# Patient Record
Sex: Female | Born: 1955 | Race: White | Hispanic: No | Marital: Married | Smoking: Never smoker
Health system: Southern US, Community
[De-identification: ages and names within clinical notes are randomized; demographics above are authoritative.]

## PROBLEM LIST (undated history)

## (undated) DIAGNOSIS — B001 Herpesviral vesicular dermatitis: Secondary | ICD-10-CM

## (undated) DIAGNOSIS — F411 Generalized anxiety disorder: Secondary | ICD-10-CM

## (undated) DIAGNOSIS — F325 Major depressive disorder, single episode, in full remission: Secondary | ICD-10-CM

## (undated) DIAGNOSIS — Z803 Family history of malignant neoplasm of breast: Secondary | ICD-10-CM

## (undated) DIAGNOSIS — E042 Nontoxic multinodular goiter: Secondary | ICD-10-CM

## (undated) DIAGNOSIS — R7611 Nonspecific reaction to tuberculin skin test without active tuberculosis: Secondary | ICD-10-CM

## (undated) DIAGNOSIS — J302 Other seasonal allergic rhinitis: Secondary | ICD-10-CM

## (undated) DIAGNOSIS — Z808 Family history of malignant neoplasm of other organs or systems: Secondary | ICD-10-CM

## (undated) DIAGNOSIS — F419 Anxiety disorder, unspecified: Secondary | ICD-10-CM

## (undated) DIAGNOSIS — H40009 Preglaucoma, unspecified, unspecified eye: Secondary | ICD-10-CM

## (undated) DIAGNOSIS — E7800 Pure hypercholesterolemia, unspecified: Secondary | ICD-10-CM

## (undated) DIAGNOSIS — T7840XA Allergy, unspecified, initial encounter: Secondary | ICD-10-CM

## (undated) DIAGNOSIS — E039 Hypothyroidism, unspecified: Secondary | ICD-10-CM

## (undated) HISTORY — DX: Family history of malignant neoplasm of other organs or systems: Z80.8

## (undated) HISTORY — DX: Pure hypercholesterolemia, unspecified: E78.00

## (undated) HISTORY — DX: Hypothyroidism, unspecified: E03.9

## (undated) HISTORY — DX: Nonspecific reaction to tuberculin skin test without active tuberculosis: R76.11

## (undated) HISTORY — DX: Other seasonal allergic rhinitis: J30.2

## (undated) HISTORY — DX: Herpesviral vesicular dermatitis: B00.1

## (undated) HISTORY — DX: Family history of malignant neoplasm of breast: Z80.3

## (undated) HISTORY — PX: CATARACT EXTRACTION, BILATERAL: SHX1313

## (undated) HISTORY — DX: Nontoxic multinodular goiter: E04.2

## (undated) HISTORY — DX: Allergy, unspecified, initial encounter: T78.40XA

## (undated) HISTORY — DX: Preglaucoma, unspecified, unspecified eye: H40.009

## (undated) HISTORY — DX: Generalized anxiety disorder: F41.1

## (undated) HISTORY — DX: Anxiety disorder, unspecified: F41.9

## (undated) HISTORY — DX: Major depressive disorder, single episode, in full remission: F32.5

---
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
## 1986-11-25 DIAGNOSIS — R7611 Nonspecific reaction to tuberculin skin test without active tuberculosis: Secondary | ICD-10-CM

## 1986-11-25 HISTORY — DX: Nonspecific reaction to tuberculin skin test without active tuberculosis: R76.11

## 1998-08-17 ENCOUNTER — Ambulatory Visit (HOSPITAL_COMMUNITY): Admission: RE | Admit: 1998-08-17 | Discharge: 1998-08-17 | Payer: Self-pay | Admitting: *Deleted

## 1999-08-20 ENCOUNTER — Ambulatory Visit (HOSPITAL_COMMUNITY): Admission: RE | Admit: 1999-08-20 | Discharge: 1999-08-20 | Payer: Self-pay | Admitting: *Deleted

## 1999-08-20 ENCOUNTER — Encounter: Payer: Self-pay | Admitting: *Deleted

## 1999-08-23 ENCOUNTER — Encounter: Payer: Self-pay | Admitting: *Deleted

## 1999-08-23 ENCOUNTER — Ambulatory Visit (HOSPITAL_COMMUNITY): Admission: RE | Admit: 1999-08-23 | Discharge: 1999-08-23 | Payer: Self-pay | Admitting: *Deleted

## 1999-09-14 ENCOUNTER — Other Ambulatory Visit: Admission: RE | Admit: 1999-09-14 | Discharge: 1999-09-14 | Payer: Self-pay | Admitting: *Deleted

## 2000-08-14 ENCOUNTER — Encounter: Payer: Self-pay | Admitting: *Deleted

## 2000-08-14 ENCOUNTER — Encounter: Admission: RE | Admit: 2000-08-14 | Discharge: 2000-08-14 | Payer: Self-pay | Admitting: *Deleted

## 2000-09-16 ENCOUNTER — Other Ambulatory Visit: Admission: RE | Admit: 2000-09-16 | Discharge: 2000-09-16 | Payer: Self-pay | Admitting: *Deleted

## 2001-07-14 ENCOUNTER — Encounter: Payer: Self-pay | Admitting: *Deleted

## 2001-07-14 ENCOUNTER — Encounter: Admission: RE | Admit: 2001-07-14 | Discharge: 2001-07-14 | Payer: Self-pay | Admitting: *Deleted

## 2001-08-17 ENCOUNTER — Other Ambulatory Visit: Admission: RE | Admit: 2001-08-17 | Discharge: 2001-08-17 | Payer: Self-pay | Admitting: *Deleted

## 2002-07-12 ENCOUNTER — Encounter: Admission: RE | Admit: 2002-07-12 | Discharge: 2002-07-12 | Payer: Self-pay | Admitting: *Deleted

## 2002-07-12 ENCOUNTER — Encounter: Payer: Self-pay | Admitting: *Deleted

## 2002-08-23 ENCOUNTER — Other Ambulatory Visit: Admission: RE | Admit: 2002-08-23 | Discharge: 2002-08-23 | Payer: Self-pay | Admitting: *Deleted

## 2003-07-11 ENCOUNTER — Encounter: Admission: RE | Admit: 2003-07-11 | Discharge: 2003-07-11 | Payer: Self-pay

## 2003-08-26 ENCOUNTER — Other Ambulatory Visit: Admission: RE | Admit: 2003-08-26 | Discharge: 2003-08-26 | Payer: Self-pay

## 2004-07-16 ENCOUNTER — Encounter: Admission: RE | Admit: 2004-07-16 | Discharge: 2004-07-16 | Payer: Self-pay | Admitting: Family Medicine

## 2004-09-03 ENCOUNTER — Other Ambulatory Visit: Admission: RE | Admit: 2004-09-03 | Discharge: 2004-09-03 | Payer: Self-pay | Admitting: Family Medicine

## 2004-11-25 DIAGNOSIS — F325 Major depressive disorder, single episode, in full remission: Secondary | ICD-10-CM

## 2004-11-25 HISTORY — DX: Major depressive disorder, single episode, in full remission: F32.5

## 2005-07-16 ENCOUNTER — Encounter: Admission: RE | Admit: 2005-07-16 | Discharge: 2005-07-16 | Payer: Self-pay | Admitting: Family Medicine

## 2005-09-09 ENCOUNTER — Other Ambulatory Visit: Admission: RE | Admit: 2005-09-09 | Discharge: 2005-09-09 | Payer: Self-pay | Admitting: Family Medicine

## 2006-03-26 ENCOUNTER — Encounter: Payer: Self-pay | Admitting: *Deleted

## 2006-06-19 ENCOUNTER — Encounter: Admission: RE | Admit: 2006-06-19 | Discharge: 2006-06-19 | Payer: Self-pay | Admitting: Family Medicine

## 2006-09-05 ENCOUNTER — Ambulatory Visit: Payer: Self-pay | Admitting: Family Medicine

## 2006-09-09 ENCOUNTER — Ambulatory Visit: Payer: Self-pay | Admitting: Family Medicine

## 2007-03-12 ENCOUNTER — Ambulatory Visit: Payer: Self-pay | Admitting: Family Medicine

## 2007-04-02 ENCOUNTER — Ambulatory Visit: Payer: Self-pay | Admitting: Family Medicine

## 2007-07-07 ENCOUNTER — Encounter: Admission: RE | Admit: 2007-07-07 | Discharge: 2007-07-07 | Payer: Self-pay | Admitting: Family Medicine

## 2007-09-08 ENCOUNTER — Ambulatory Visit: Payer: Self-pay | Admitting: Family Medicine

## 2007-09-08 ENCOUNTER — Other Ambulatory Visit: Admission: RE | Admit: 2007-09-08 | Discharge: 2007-09-08 | Payer: Self-pay | Admitting: Family Medicine

## 2008-02-19 LAB — HM COLONOSCOPY

## 2008-03-18 ENCOUNTER — Ambulatory Visit: Payer: Self-pay | Admitting: Family Medicine

## 2008-05-25 ENCOUNTER — Encounter: Admission: RE | Admit: 2008-05-25 | Discharge: 2008-05-25 | Payer: Self-pay | Admitting: Family Medicine

## 2008-06-21 ENCOUNTER — Ambulatory Visit: Payer: Self-pay | Admitting: Family Medicine

## 2008-08-30 ENCOUNTER — Ambulatory Visit: Payer: Self-pay | Admitting: Family Medicine

## 2008-08-30 ENCOUNTER — Other Ambulatory Visit: Admission: RE | Admit: 2008-08-30 | Discharge: 2008-08-30 | Payer: Self-pay | Admitting: Family Medicine

## 2008-08-30 ENCOUNTER — Encounter: Payer: Self-pay | Admitting: Family Medicine

## 2009-02-13 ENCOUNTER — Ambulatory Visit: Payer: Self-pay | Admitting: Family Medicine

## 2009-04-26 ENCOUNTER — Encounter: Admission: RE | Admit: 2009-04-26 | Discharge: 2009-04-26 | Payer: Self-pay | Admitting: Family Medicine

## 2009-05-03 ENCOUNTER — Ambulatory Visit: Payer: Self-pay | Admitting: Family Medicine

## 2009-08-01 ENCOUNTER — Encounter: Admission: RE | Admit: 2009-08-01 | Discharge: 2009-08-01 | Payer: Self-pay | Admitting: Endocrinology

## 2009-08-20 ENCOUNTER — Emergency Department (HOSPITAL_COMMUNITY): Admission: EM | Admit: 2009-08-20 | Discharge: 2009-08-21 | Payer: Self-pay | Admitting: Emergency Medicine

## 2009-08-21 ENCOUNTER — Ambulatory Visit: Payer: Self-pay | Admitting: Family Medicine

## 2009-08-29 ENCOUNTER — Encounter (INDEPENDENT_AMBULATORY_CARE_PROVIDER_SITE_OTHER): Payer: Self-pay | Admitting: Diagnostic Radiology

## 2009-08-29 ENCOUNTER — Other Ambulatory Visit: Admission: RE | Admit: 2009-08-29 | Discharge: 2009-08-29 | Payer: Self-pay | Admitting: Diagnostic Radiology

## 2009-08-29 ENCOUNTER — Encounter: Admission: RE | Admit: 2009-08-29 | Discharge: 2009-08-29 | Payer: Self-pay | Admitting: Endocrinology

## 2009-09-22 ENCOUNTER — Ambulatory Visit: Payer: Self-pay | Admitting: Family Medicine

## 2010-01-04 ENCOUNTER — Other Ambulatory Visit: Admission: RE | Admit: 2010-01-04 | Discharge: 2010-01-04 | Payer: Self-pay | Admitting: Family Medicine

## 2010-04-18 ENCOUNTER — Encounter: Admission: RE | Admit: 2010-04-18 | Discharge: 2010-04-18 | Payer: Self-pay | Admitting: Family Medicine

## 2010-08-29 ENCOUNTER — Encounter: Admission: RE | Admit: 2010-08-29 | Discharge: 2010-08-29 | Payer: Self-pay | Admitting: Endocrinology

## 2010-12-16 ENCOUNTER — Encounter: Payer: Self-pay | Admitting: Family Medicine

## 2011-03-01 LAB — POCT I-STAT, CHEM 8
Calcium, Ion: 1.18 mmol/L (ref 1.12–1.32)
Hemoglobin: 15.6 g/dL — ABNORMAL HIGH (ref 12.0–15.0)

## 2012-10-01 ENCOUNTER — Other Ambulatory Visit: Payer: Self-pay | Admitting: Endocrinology

## 2012-10-01 DIAGNOSIS — E049 Nontoxic goiter, unspecified: Secondary | ICD-10-CM

## 2012-10-02 ENCOUNTER — Ambulatory Visit
Admission: RE | Admit: 2012-10-02 | Discharge: 2012-10-02 | Disposition: A | Discharge status: Home / Self Care | Payer: BC Managed Care – PPO | Source: Ambulatory Visit | Attending: Endocrinology | Admitting: Endocrinology

## 2012-10-02 DIAGNOSIS — E049 Nontoxic goiter, unspecified: Secondary | ICD-10-CM

## 2013-07-08 ENCOUNTER — Other Ambulatory Visit (HOSPITAL_COMMUNITY)
Admission: RE | Admit: 2013-07-08 | Discharge: 2013-07-08 | Disposition: A | Discharge status: Home / Self Care | Payer: BC Managed Care – PPO | Source: Ambulatory Visit | Attending: Family Medicine | Admitting: Family Medicine

## 2013-07-08 ENCOUNTER — Other Ambulatory Visit: Payer: Self-pay | Admitting: Family Medicine

## 2013-07-08 DIAGNOSIS — Z124 Encounter for screening for malignant neoplasm of cervix: Secondary | ICD-10-CM | POA: Insufficient documentation

## 2013-07-08 LAB — HM PAP SMEAR
HM PAP: NEGATIVE
HM PAP: NEGATIVE

## 2013-10-01 ENCOUNTER — Other Ambulatory Visit: Payer: Self-pay | Admitting: Endocrinology

## 2013-10-01 DIAGNOSIS — E049 Nontoxic goiter, unspecified: Secondary | ICD-10-CM

## 2014-10-03 ENCOUNTER — Ambulatory Visit
Admission: RE | Admit: 2014-10-03 | Discharge: 2014-10-03 | Disposition: A | Discharge status: Home / Self Care | Payer: No Typology Code available for payment source | Source: Ambulatory Visit | Attending: Endocrinology | Admitting: Endocrinology

## 2014-10-03 DIAGNOSIS — E049 Nontoxic goiter, unspecified: Secondary | ICD-10-CM

## 2015-03-22 LAB — HM MAMMOGRAPHY: HM Mammogram: NEGATIVE

## 2015-03-22 LAB — HM DEXA SCAN: HM Dexa Scan: NEGATIVE

## 2015-08-21 ENCOUNTER — Telehealth: Payer: Self-pay | Admitting: Family Medicine

## 2015-08-21 NOTE — Telephone Encounter (Signed)
Pt called, she was a pt here in 2010 and seen dr Redmond School and wants to become a new est patient here and was requesting to see you. I told her you wasn't accepting new pts but I would ask you first. Rachel Juarez she would just really like a woman, pt can be reached at [phone]

## 2015-08-21 NOTE — Telephone Encounter (Signed)
If asking for female provider, offer [facility], who is taking new patients

## 2015-08-22 NOTE — Telephone Encounter (Signed)
Called pt back two times to call me back

## 2015-08-25 ENCOUNTER — Ambulatory Visit: Payer: Self-pay | Admitting: Family Medicine

## 2015-08-30 ENCOUNTER — Ambulatory Visit (INDEPENDENT_AMBULATORY_CARE_PROVIDER_SITE_OTHER): Payer: BLUE CROSS/BLUE SHIELD | Admitting: Family Medicine

## 2015-08-30 ENCOUNTER — Encounter: Payer: Self-pay | Admitting: Family Medicine

## 2015-08-30 VITALS — BP 122/80 | HR 72 | Ht 66.25 in | Wt 167.8 lb

## 2015-08-30 DIAGNOSIS — F411 Generalized anxiety disorder: Secondary | ICD-10-CM

## 2015-08-30 DIAGNOSIS — J302 Other seasonal allergic rhinitis: Secondary | ICD-10-CM | POA: Diagnosis not present

## 2015-08-30 DIAGNOSIS — E042 Nontoxic multinodular goiter: Secondary | ICD-10-CM

## 2015-08-30 DIAGNOSIS — F325 Major depressive disorder, single episode, in full remission: Secondary | ICD-10-CM

## 2015-08-30 DIAGNOSIS — Z23 Encounter for immunization: Secondary | ICD-10-CM

## 2015-08-30 DIAGNOSIS — B001 Herpesviral vesicular dermatitis: Secondary | ICD-10-CM | POA: Diagnosis not present

## 2015-08-30 MED ORDER — FLUTICASONE PROPIONATE 50 MCG/ACT NA SUSP: 2.0000 | Freq: Every day | NASAL | Status: DC

## 2015-08-30 MED ORDER — MONTELUKAST SODIUM 10 MG PO TABS: 10.0000 mg | ORAL_TABLET | Freq: Every day | ORAL | Status: DC

## 2015-08-30 MED ORDER — VALACYCLOVIR HCL 1 G PO TABS: ORAL_TABLET | ORAL | Status: DC

## 2015-08-30 MED ORDER — CITALOPRAM HYDROBROMIDE 40 MG PO TABS: 40.0000 mg | ORAL_TABLET | Freq: Every day | ORAL | Status: DC

## 2015-08-30 NOTE — Progress Notes (Signed)
Chief Complaint  Patient presents with  . Establish Care    here to establish care with Dr.Aliany Fiorenza.    Patient presents to establish care.  She is under the care of Dr. Chalmers Cater for her hypothyroidism/multinodular goiter. She previously saw Dr. Cipriano Mile at [facility]. Records have not yet been requested.  She needs refills on her medications . She last saw Dr. Tamala Julian about a year ago for her physical.  Herpes labialis--uses valtrex 2000mg  qd x 2 days prn flare  Depression and anxiety--diagnosed when her daughter was diagnosed with auto-immune disorder 10 years ago.  She has been on the medication ever since.  Her daughter is doing well with treatment.  She denies any depression.  Has anxiety related to getting mammograms and flying, and uses xanax prn in those situations.  She has never tried a lower dose of coming off of the medication.  Allergies:  They are year-round but seasonal component, definitely worse in the fall.  They are overall well controlled with daily Allegra, Flonase and singulair.   She has had elevated cholesterol--reports that the total was high, the HDL was >100, but that the LDL was also a little high.  She tries to take plant sterols and fish oil, and follow a low cholesterol diet.  Immunizations: She believes her last tetanus was 2006 (before her daughter got sick)--doesn't believe she has gotten a TdaP.  Past Medical History  Diagnosis Date  . Hypothyroidism     treated by Dr. Chalmers Cater  . Seasonal allergies     takes singulair year-round; worse in the Fall  . 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 Generalized anxiety disorder     generalized, and related to mammograms and flying  . Herpes labialis   . Glaucoma suspect     sees Dr. Katy Fitch, has all been okay, sees him qoyr  . Hypercholesterolemia     high HDL, borderline LDL  . Multinodular goiter     followed by Dr. Chalmers Cater; u/s q2 yr  . 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 No pertinent past surgical history.  Social History   Social History  . Marital Status: Married    Spouse Name: N/A  . Number of Children: N/A  . Years of Education: N/A   Occupational History  . yoga teacher    Social History Main Topics  . Smoking status: Never Smoker   . Smokeless tobacco: Never Used  . Alcohol Use: 0.0 oz/week    0 Standard drinks or equivalent per week     Comment: 2 glasses of wine per day with dinner  . Drug Use: No  . Sexual Activity: Not on file   Other Topics Concern  . Not on file   Social History Narrative   Teaches yoga at churches, [employer], and 2 yoga studios.   Lives with her husband, and she has a college-age daughter ([place]).   1 dog ([place]poo), 1 cat    Family History  Problem Relation Age of Onset  . Lupus Mother   . Cancer Father 64    thymic cancer  . COPD Father   . Heart disease Father     CHF  . Asthma Father   . Dermatomyositis Daughter 73    juvenile form  . Irritable bowel syndrome Daughter   . Cancer Paternal Uncle     started on  tonsils (smoker)  . Colon cancer Maternal Grandmother     80's  . Cancer Maternal Grandmother   . Hypertension Paternal Grandmother   . Hyperlipidemia Paternal Grandmother   . Thyroid disease Paternal Grandmother     had goiter removed  . Stroke Paternal Grandmother 81  . Hypertension Paternal Grandfather   . Hyperlipidemia Paternal Grandfather   . Heart disease Paternal Grandfather     MI first in his 84's  . Stroke Paternal Grandfather   . Diabetes Neg Hx   . Breast cancer Neg Hx   . Ovarian cancer Neg Hx     Outpatient Encounter Prescriptions as of 08/30/2015  Medication Sig Note  . aspirin 81 MG tablet Take 81 mg by mouth daily.   . citalopram (CELEXA) 40 MG tablet Take 40 mg by mouth daily.   . fexofenadine (ALLEGRA) 180 MG tablet Take 180 mg by mouth daily.   . fluticasone (FLONASE) 50 MCG/ACT nasal spray Place 2 sprays into both  nostrils daily.   Marland Kitchen levothyroxine (SYNTHROID, LEVOTHROID) 75 MCG tablet Take 75 mcg by mouth daily before breakfast.   . montelukast (SINGULAIR) 10 MG tablet Take 10 mg by mouth at bedtime.   . Multiple Vitamins-Minerals (MULTIVITAMIN WITH MINERALS) tablet Take 1 tablet by mouth daily.   . Omega-3 Fatty Acids (FISH OIL) 1000 MG CPDR Take 2,000 mg by mouth daily.   Marland Kitchen PHYTOSTEROL ESTERS PO Take 2 tablets by mouth daily. 08/30/2015: Plant sterol for cholesterol.  Takes 2/day (supposed to take 4)  . ALPRAZolam (XANAX) 0.5 MG tablet Take 0.5 mg by mouth at bedtime as needed for anxiety. 08/30/2015: For flying and mammo day  . valACYclovir (VALTREX) 1000 MG tablet Take 1,000 mg by mouth 2 (two) times daily. 08/30/2015: Takes 2000 qd x 2 days prn flares of cold sores   No facility-administered encounter medications on file as of 08/30/2015.    Allergies  Allergen Reactions  . Demerol [Meperidine] Other (See Comments)    Low bp, passes out   ROS: no headaches, dizziness, chest pain, shortness of breath GI or GU complaints.  +allergies--overall controlled, occasional some mild eye symptoms.  Denies cough. Denies bleeding, bruising, rash, joint pains, depression, anxiety. No hair/skin/bowel/mood/energy/weight changes.  PHYSICAL EXAM: BP 122/80 mmHg  Pulse 72  Ht 5' 6.25" (1.683 m)  Wt 167 lb 12.8 oz (76.114 kg)  BMI 26.87 kg/m2  LMP 11/18/2008  Well developed, pleasant female in no distress HEENT: PERRL EOMI, conjunctiva clear. Nasal mucosa is moderately edematous, without erythema or purulence. OP is clear. Sinuses nontender Neck: no lymphadenopathy or carotid bruit. Mildly diffusely enlarged thyroid, without any dominant nodule Heart: regular rate and rhythm without murmur Lungs: clear bilaterally Back: no spinal or CVA tenderness Abdomen: soft, nontender, no organomegaly or mass Extremities: no edema, 2+ pulses Neuro: alert and oriented. Cranial nerves intact. Normal strength, gait Psych:  normal mood, affect, hygiene and grooming  ASSESSMENT/PLAN  Seasonal allergies - controlled with current regimen - Plan: fluticasone (FLONASE) 50 MCG/ACT nasal spray, montelukast (SINGULAIR) 10 MG tablet  Need for prophylactic vaccination and inoculation against influenza - Plan: Flu Vaccine QUAD 36+ mos PF IM (Fluarix & Fluzone Quad PF)  Herpes labialis - discussed changing to 2 doses 12 hrs apart prn flare, rather than 24hrs - Plan: valACYclovir (VALTREX) 1000 MG tablet  Depression, major, in remission ([place]) - Doing well; encouraged her to consider trial of 1/2 tab (20mg ) for a couple of months (increase back to 40mg  if recurrent sx) -  Plan: citalopram (CELEXA) 40 MG tablet  Generalized anxiety disorder - controlled with citalopram and prn xanax with flying and mammograms. consider trial of 20mg  - Plan: citalopram (CELEXA) 40 MG tablet  Multinodular goiter - as per Dr. Chalmers Cater.  UTD on TSH and ultrasound   Sign release of records to get records from [place]. Last CPE was about a year ago Check [facility] records for last tetanus.  May need Tdap

## 2015-08-30 NOTE — Patient Instructions (Signed)
Sign release forms for records from [place]. If your last tetanus was 10 years ago or you never had Tdap, we will give you one at your physical. Continue your current medications. Consider decreasing the citalopram dose to 20mg  (1/2 tablet) to see if any symptoms recur at the lower dose.  If you take that for a few months, and have no recurrent symptoms, you can call to change the prescription to 20mg  (so you don't have to cut the pills), or you can consider cutting back on the dose further.

## 2015-09-11 ENCOUNTER — Telehealth: Payer: Self-pay | Admitting: Family Medicine

## 2015-09-11 NOTE — Telephone Encounter (Signed)
Records recv'd from Dr. Carol Ada

## 2015-09-21 ENCOUNTER — Encounter: Payer: Self-pay | Admitting: *Deleted

## 2015-09-26 ENCOUNTER — Encounter: Payer: Self-pay | Admitting: Family Medicine

## 2015-09-28 ENCOUNTER — Encounter: Payer: Self-pay | Admitting: Family Medicine

## 2015-10-04 ENCOUNTER — Encounter: Payer: Self-pay | Admitting: Family Medicine

## 2015-10-04 ENCOUNTER — Ambulatory Visit (INDEPENDENT_AMBULATORY_CARE_PROVIDER_SITE_OTHER): Payer: BLUE CROSS/BLUE SHIELD | Admitting: Family Medicine

## 2015-10-04 VITALS — BP 128/68 | HR 72 | Temp 98.0°F | Ht 66.25 in | Wt 170.8 lb

## 2015-10-04 DIAGNOSIS — J309 Allergic rhinitis, unspecified: Secondary | ICD-10-CM

## 2015-10-04 DIAGNOSIS — R5383 Other fatigue: Secondary | ICD-10-CM | POA: Diagnosis not present

## 2015-10-04 NOTE — Patient Instructions (Signed)
  Drink plenty of fluids. Try taking Mucinex (plain or extra strength, not the D or DM)--this is an expectorant to loosen the phlegm and hopefully help the sinuses drain. Try using Sudafed as needed for sinus headaches.  If you don't have insomnia from this, you can consider getting a combination pill such as Claritin D.  Try switching to a different antisthistamine for a few days, and if that doesn't help, we can try switching to a different nasal steroid spray.  Claritin and zyrtec are the pill options.  Nasacort is an OTC steroid spray option, but there are many other prescription ones also available. Let us know if you develop fevers, or thick discolored nasal drainage (or phlegm)--this can be a sign of an infection that needs antibiotics.

## 2015-10-04 NOTE — Progress Notes (Signed)
Chief Complaint  Patient presents with  . Headache    has been waking up with "low grade headache," over the last 2 weeks. Also having a lot of fatigue. Lots of bloody mucus from her nose.    Complaining of low grade frontal headaches in the mornings.  These are relieved by Advil.  At first she would have the headache come back in the evening, but not recently.  She is also complaining of "brain fog", and lack of energy.  She has runny nose, sniffling, sneezing, itchy nose.  Nose is also sometimes dry, sometimes sees bloody drainage.  Feels heavy behind her eyes. No pain in her cheeks or teeth, no ear pain or sore throat.  She does have some postnasal drainage.  She has chronic allergies, and has been compliant with her flonase, allegra and singulair daily. Switched from zyrtec to [place] 3 years ago.  PMH, PSH, SH reviewed.  Outpatient Encounter Prescriptions as of 10/04/2015  Medication Sig Note  . aspirin 81 MG tablet Take 81 mg by mouth daily.   . citalopram (CELEXA) 40 MG tablet Take 1 tablet (40 mg total) by mouth daily.   . fexofenadine (ALLEGRA) 180 MG tablet Take 180 mg by mouth daily.   . fluticasone (FLONASE) 50 MCG/ACT nasal spray Place 2 sprays into both nostrils daily.   Marland Kitchen levothyroxine (SYNTHROID, LEVOTHROID) 75 MCG tablet Take 75 mcg by mouth daily before breakfast.   . montelukast (SINGULAIR) 10 MG tablet Take 1 tablet (10 mg total) by mouth at bedtime.   . Multiple Vitamins-Minerals (MULTIVITAMIN WITH MINERALS) tablet Take 1 tablet by mouth daily.   . Omega-3 Fatty Acids (FISH OIL) 1000 MG CPDR Take 2,000 mg by mouth daily.   Marland Kitchen PHYTOSTEROL ESTERS PO Take 2 tablets by mouth daily. 08/30/2015: Plant sterol for cholesterol.  Takes 2/day (supposed to take 4)  . ALPRAZolam (XANAX) 0.5 MG tablet Take 0.5 mg by mouth at bedtime as needed for anxiety. 08/30/2015: For flying and mammo day  . valACYclovir (VALTREX) 1000 MG tablet Take 2 tablets at onset of cold sore and repeat dose in  12 hours (4 tabs/course) (Patient not taking: Reported on 10/04/2015)    No facility-administered encounter medications on file as of 10/04/2015.   Allergies  Allergen Reactions  . Demerol [Meperidine] Other (See Comments)    Low bp, passes out   ROS: no fever, chills, purulent drainage, cough, shortness of breath, chest pain, palpitations, nausea, vomiting, diarrhea, bleeding, bruising, rash.  +fatigue and "brain fog" as per HPI.  PHYSICAL EXAM:  BP 128/68 mmHg  Pulse 72  Temp(Src) 98 F (36.7 C) (Tympanic)  Ht 5' 6.25" (1.683 m)  Wt 170 lb 12.8 oz (77.474 kg)  BMI 27.35 kg/m2  LMP 11/18/2008 Slightly tired appearing female, with frequent throat-clearing during visit, in no distress HEENT: PERRL, EOMI, conjunctiva clear.  TM's and EAC's normal. Nasal mucosa is moderately edematous with some clear mucus noted.  Mucosa is not particularly pale/boggy, but no erythema or purulence. Sinuses are nontender. OP is clear Neck: no lymphadenopathy, thyromegaly or mass Heart: regular rate and rhythm without murmur Lungs: clear bilaterally Skin: normal turgor, no rash Psych: normal mood, affect, hygiene and grooming  ASSESSMENT/PLAN:  Allergic rhinitis, unspecified allergic rhinitis type - add decongestant. Try switching to different antihistamine and/or nasal steroid. reviewed s/sx of infection  Other fatigue - and "brain fog"--suspect related to suboptimally controlled allergies and head congestion. Consider labs if sx persist when allergies better    Drink  plenty of fluids. Try taking Mucinex (plain or extra strength, not the D or DM)--this is an expectorant to loosen the phlegm and hopefully help the sinuses drain. Try using Sudafed as needed for sinus headaches.  If you don't have insomnia from this, you can consider getting a combination pill such as Claritin D.  Try switching to a different antisthistamine for a few days, and if that doesn't help, we can try switching to a different  nasal steroid spray.  Claritin and zyrtec are the pill options.  Nasacort is an OTC steroid spray option, but there are many other prescription ones also available. Let us know if you develop fevers, or thick discolored nasal drainage (or phlegm)--this can be a sign of an infection that needs antibiotics.

## 2015-10-09 ENCOUNTER — Telehealth: Payer: Self-pay | Admitting: Family Medicine

## 2015-10-09 ENCOUNTER — Encounter: Payer: Self-pay | Admitting: Family Medicine

## 2015-10-09 MED ORDER — AMOXICILLIN-POT CLAVULANATE 875-125 MG PO TABS: 1.0000 | ORAL_TABLET | Freq: Two times a day (BID) | ORAL | Status: DC

## 2015-10-09 NOTE — Telephone Encounter (Signed)
Pt called and said that she had emailed you over mychart, said she has had bloody mucous, and has had a lot of it, mucous said she has had a lot of pain and pressure behind her eyes, and just feels like crap, no fever, which she has not checked it, she dosen't think changing nasal steroid or antihistamines at this point will make a difference,, [facility] uses [facility] at [address] pt can be reached at [phone]

## 2015-10-09 NOTE — Telephone Encounter (Signed)
I was just looking for a true symptom of infection (ie discolored mucus or fever).  At this point, we will go ahead and treat for sinus infection.  Advise pt that I sent prescription for Augmentin, to be taken twice daily for 10 days.  If she develops diarrhea, start probiotics.

## 2015-10-09 NOTE — Telephone Encounter (Signed)
Left message informing patient.

## 2016-01-17 ENCOUNTER — Encounter: Payer: Self-pay | Admitting: Family Medicine

## 2016-01-17 ENCOUNTER — Other Ambulatory Visit (HOSPITAL_COMMUNITY)
Admission: RE | Admit: 2016-01-17 | Discharge: 2016-01-17 | Disposition: A | Discharge status: Home / Self Care | Payer: BLUE CROSS/BLUE SHIELD | Source: Ambulatory Visit | Attending: Family Medicine | Admitting: Family Medicine

## 2016-01-17 ENCOUNTER — Ambulatory Visit (INDEPENDENT_AMBULATORY_CARE_PROVIDER_SITE_OTHER): Payer: BLUE CROSS/BLUE SHIELD | Admitting: Family Medicine

## 2016-01-17 VITALS — BP 120/74 | HR 68 | Temp 98.5°F | Ht 65.75 in | Wt 170.0 lb

## 2016-01-17 DIAGNOSIS — J069 Acute upper respiratory infection, unspecified: Secondary | ICD-10-CM | POA: Diagnosis not present

## 2016-01-17 DIAGNOSIS — D7589 Other specified diseases of blood and blood-forming organs: Secondary | ICD-10-CM

## 2016-01-17 DIAGNOSIS — E559 Vitamin D deficiency, unspecified: Secondary | ICD-10-CM

## 2016-01-17 DIAGNOSIS — Z0000 Encounter for general adult medical examination without abnormal findings: Secondary | ICD-10-CM

## 2016-01-17 DIAGNOSIS — E042 Nontoxic multinodular goiter: Secondary | ICD-10-CM | POA: Diagnosis not present

## 2016-01-17 DIAGNOSIS — Z1151 Encounter for screening for human papillomavirus (HPV): Secondary | ICD-10-CM | POA: Diagnosis not present

## 2016-01-17 DIAGNOSIS — R5383 Other fatigue: Secondary | ICD-10-CM | POA: Diagnosis not present

## 2016-01-17 DIAGNOSIS — F325 Major depressive disorder, single episode, in full remission: Secondary | ICD-10-CM | POA: Diagnosis not present

## 2016-01-17 DIAGNOSIS — Z01419 Encounter for gynecological examination (general) (routine) without abnormal findings: Secondary | ICD-10-CM | POA: Insufficient documentation

## 2016-01-17 DIAGNOSIS — F411 Generalized anxiety disorder: Secondary | ICD-10-CM | POA: Diagnosis not present

## 2016-01-17 DIAGNOSIS — B001 Herpesviral vesicular dermatitis: Secondary | ICD-10-CM | POA: Insufficient documentation

## 2016-01-17 DIAGNOSIS — E7800 Pure hypercholesterolemia, unspecified: Secondary | ICD-10-CM | POA: Diagnosis not present

## 2016-01-17 DIAGNOSIS — J309 Allergic rhinitis, unspecified: Secondary | ICD-10-CM

## 2016-01-17 LAB — LIPID PANEL
CHOL/HDL RATIO: 2.8 ratio (ref <=5.0)
CHOLESTEROL: 236 mg/dL — AB (ref 125–200)
HDL: 84 mg/dL (ref >=46)
LDL Cholesterol: 135 mg/dL — ABNORMAL HIGH (ref <130)
TRIGLYCERIDES: 87 mg/dL (ref <150)
VLDL: 17 mg/dL (ref <30)

## 2016-01-17 LAB — CBC WITH DIFFERENTIAL/PLATELET
BASOS ABS: 0.1 10*3/uL (ref 0.0–0.1)
Basophils Relative: 1 % (ref 0–1)
EOS PCT: 4 % (ref 0–5)
Eosinophils Absolute: 0.2 10*3/uL (ref 0.0–0.7)
HEMATOCRIT: 41.5 % (ref 36.0–46.0)
Hemoglobin: 13.8 g/dL (ref 12.0–15.0)
LYMPHS PCT: 27 % (ref 12–46)
Lymphs Abs: 1.4 10*3/uL (ref 0.7–4.0)
MCH: 33.4 pg (ref 26.0–34.0)
MCHC: 33.3 g/dL (ref 30.0–36.0)
MCV: 100.5 fL — ABNORMAL HIGH (ref 78.0–100.0)
MPV: 10.6 fL (ref 8.6–12.4)
Monocytes Absolute: 0.4 10*3/uL (ref 0.1–1.0)
Monocytes Relative: 8 % (ref 3–12)
NEUTROS PCT: 60 % (ref 43–77)
Neutro Abs: 3 10*3/uL (ref 1.7–7.7)
PLATELETS: 255 10*3/uL (ref 150–400)
RBC: 4.13 MIL/uL (ref 3.87–5.11)
RDW: 14.2 % (ref 11.5–15.5)
WBC: 5 10*3/uL (ref 4.0–10.5)

## 2016-01-17 LAB — POCT URINALYSIS DIPSTICK
Bilirubin, UA: NEGATIVE
Blood, UA: NEGATIVE
Glucose, UA: NEGATIVE
Ketones, UA: NEGATIVE
LEUKOCYTES UA: NEGATIVE
NITRITE UA: NEGATIVE
PH UA: 7.5
PROTEIN UA: NEGATIVE
Spec Grav, UA: 1.015
Urobilinogen, UA: NEGATIVE

## 2016-01-17 LAB — COMPREHENSIVE METABOLIC PANEL
ALBUMIN: 4.4 g/dL (ref 3.6–5.1)
ALT: 45 U/L — ABNORMAL HIGH (ref 6–29)
AST: 29 U/L (ref 10–35)
Alkaline Phosphatase: 70 U/L (ref 33–130)
BUN: 9 mg/dL (ref 7–25)
CALCIUM: 9.6 mg/dL (ref 8.6–10.4)
CHLORIDE: 103 mmol/L (ref 98–110)
CO2: 29 mmol/L (ref 20–31)
CREATININE: 0.78 mg/dL (ref 0.50–1.05)
Glucose, Bld: 88 mg/dL (ref 65–99)
POTASSIUM: 4.7 mmol/L (ref 3.5–5.3)
Sodium: 139 mmol/L (ref 135–146)
TOTAL PROTEIN: 7.1 g/dL (ref 6.1–8.1)
Total Bilirubin: 0.6 mg/dL (ref 0.2–1.2)

## 2016-01-17 NOTE — Patient Instructions (Addendum)
  HEALTH MAINTENANCE RECOMMENDATIONS:  It is recommended that you get at least 30 minutes of aerobic exercise at least 5 days/week (for weight loss, you may need as much as 60-90 minutes). This can be any activity that gets your heart rate up. This can be divided in 10-15 minute intervals if needed, but try and build up your endurance at least once a week.  Weight bearing exercise is also recommended twice weekly.  Eat a healthy diet with lots of vegetables, fruits and fiber.  "Colorful" foods have a lot of vitamins (ie green vegetables, tomatoes, red peppers, etc).  Limit sweet tea, regular sodas and alcoholic beverages, all of which has a lot of calories and sugar.  Up to 1 alcoholic drink daily may be beneficial for women (unless trying to lose weight, watch sugars).  Drink a lot of water.  Calcium recommendations are 1200-1500 mg daily (1500 mg for postmenopausal women or women without ovaries), and vitamin D 1000 IU daily.  This should be obtained from diet and/or supplements (vitamins), and calcium should not be taken all at once, but in divided doses.  Monthly self breast exams and yearly mammograms for women over the age of 31 is recommended.  Sunscreen of at least SPF 30 should be used on all sun-exposed parts of the skin when outside between the hours of 10 am and 4 pm (not just when at beach or pool, but even with exercise, golf, tennis, and yard work!)  Use a sunscreen that says "broad spectrum" so it covers both UVA and UVB rays, and make sure to reapply every 1-2 hours.  Remember to change the batteries in your smoke detectors when changing your clock times in the spring and fall.  Use your seat belt every time you are in a car, and please drive safely and not be distracted with cell phones and texting while driving.  I recommend taking a 12 hour Mucinex (plain or the DM if you have a lot of cough) to help keep the mucus and phlegm thin. Take it twice daily. Add a 12 hour  sudafed/pseudoephedrine, taken once daily in the morning (can be taken twice daily if it doesn't cause insomnia. Use these additional measures just while your congestion is worse, as needed.

## 2016-01-17 NOTE — Progress Notes (Signed)
Chief Complaint  Patient presents with  . Annual Exam    fasting annual exam with pap or pelvic (last pap 06/2013). Did not do eye exam as she just saw Dr.Groat. Thinks she is having allergy issues, nasal congestion, drainage and popping in her ears. Coughing a lot, mucus is clear/bloody-no fevers.     Rachel Juarez is a 60 y.o. female who presents for a complete physical.  She has the following concerns:  Last week she had postnasal drainage, sore throat, sneezing, coughing over the last 4-5 days, some hoarseness.  Mucus is clear from the nose, sometimes slightly bloody.  Cough is mostly dry.  Denies significant chest tightness, shortness of breath.  She hasn't walked much, trying to avoid being outside.  She switched from Allegra to Claritin in the fall, and from Flonase to Saunemin.  She didn't notice much difference, eventually needed an antibiotic.  She has continued this allergy regimen, along with daily montelukast.    Hypothyroidism/multinodular goiter--under the care of Dr. Chalmers Cater. Last ultrasound 09/2014, showing stable nodular heterogeneous thyroid gland, unchanged from prior US.  Herpes labialis--uses Valtrex prn. She has had increased frequency since the fall, about 4 episodes.    Depression and anxiety--diagnosed when her daughter was diagnosed with auto-immune disorder 10 years ago. She has been on the medication ever since. Her daughter is doing well with treatment. She denies any depression. Has anxiety related to getting mammograms and flying, and uses xanax prn in those situations. She has never tried a lower dose of coming off of the medication. Her husband lost one of his businesses over the summer, trying to re-create it, some stress, and prefers to continue on current dose for now.  Allergies: They are year-round but seasonal component, worse in the spring, also flares in the fall. She takes singulair daily, and changed steroid and antihistamine as above, back in the  Fall.  She has had elevated cholesterol--reports that the total was high, the HDL was >100, but that the LDL was also a little high. She tries to take plant sterols and fish oil, and follow a low cholesterol diet.  Macrocytosis.  Last labs 07/2014, and B12 330 range. MCV was at 100, prev up to 104 per last records.  Pt recalls seeing hematologist in the past.  No work-up was needed.  High cholesterol--she has had good HDL, but some elevated LDL's in the past also. Typical diet contains sausage and red meat once weekly, no regular eggs. Occasional cheese.  Vinaigrette dressings. Mayo on sandwiches.  Immunization History  Administered Date(s) Administered  . Influenza,inj,Quad PF,36+ Mos 08/30/2015  . Tdap 02/12/2011   Last Pap smear: 06/2013 Last mammogram: 02/2015 [facility]) Last colonoscopy: 01/2008 Dr. [facility] Last DEXA: 02/2015 Teola [facility]) Dentist: twice a year Ophtho: Dr. 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 She reports that level 2 and 3 yoga has cardio. She donates blood regularly, last about 1 year ago.  Can't currently due to cartilage piercing.  Past Medical History  Diagnosis Date  . Hypothyroidism     treated by Dr. Chalmers Cater  . Seasonal allergies     takes singulair year-round; worse in the Fall  . 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 Generalized anxiety disorder     generalized, and related to mammograms and flying  . Herpes labialis   . Glaucoma suspect  sees Dr. Katy Fitch, has all been okay, sees him qoyr  . Hypercholesterolemia     high HDL, borderline LDL  . Multinodular goiter     followed by Dr. Chalmers Cater; u/s q2 yr  . 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 Allergy   . Anxiety     History reviewed. No pertinent past surgical history.  Social History   Social History  . Marital Status: Married     Spouse Name: N/A  . Number of Children: N/A  . Years of Education: N/A   Occupational History  . yoga teacher    Social History Main Topics  . Smoking status: Never Smoker   . Smokeless tobacco: Never Used  . Alcohol Use: 0.0 oz/week    0 Standard drinks or equivalent per week     Comment: 1-2 glasses of wine per day with dinner  . Drug Use: No  . Sexual Activity: Not on file   Other Topics Concern  . Not on file   Social History Narrative   Teaches yoga at churches, [employer], and 2 yoga studios.   Lives with her husband, and she has a college-age daughter (Causey).   1 dog (Malti-poo), 1 cat    Family History  Problem Relation Age of Onset  . Lupus Mother   . Cancer Father 57    thymic cancer  . COPD Father   . Heart disease Father     CHF  . Asthma Father   . Dermatomyositis Daughter 63    juvenile form  . Irritable bowel syndrome Daughter   . Cancer Paternal Uncle     started on tonsils (smoker)  . Colon cancer Maternal Grandmother     80's  . Cancer Maternal Grandmother   . Hypertension Paternal Grandmother   . Hyperlipidemia Paternal Grandmother   . Thyroid disease Paternal Grandmother     had goiter removed  . Stroke Paternal Grandmother 81  . Hypertension Paternal Grandfather   . Hyperlipidemia Paternal Grandfather   . Heart disease Paternal Grandfather     MI first in his 35's  . Stroke Paternal Grandfather   . Diabetes Neg Hx   . Breast cancer Neg Hx   . Ovarian cancer Neg Hx     Outpatient Encounter Prescriptions as of 01/17/2016  Medication Sig Note  . aspirin 81 MG tablet Take 81 mg by mouth daily.   . citalopram (CELEXA) 40 MG tablet Take 1 tablet (40 mg total) by mouth daily.   Marland Kitchen levothyroxine (SYNTHROID, LEVOTHROID) 75 MCG tablet Take 75 mcg by mouth daily before breakfast.   . loratadine (CLARITIN) 10 MG tablet Take 10 mg by mouth daily.   . Misc Natural Products (TART CHERRY ADVANCED PO) Take 1 capsule by mouth daily.   .  montelukast (SINGULAIR) 10 MG tablet Take 1 tablet (10 mg total) by mouth at bedtime.   . Multiple Vitamins-Minerals (MULTIVITAMIN WITH MINERALS) tablet Take 1 tablet by mouth daily.   . Omega-3 Fatty Acids (FISH OIL) 1000 MG CPDR Take 2,000 mg by mouth daily.   Marland Kitchen PHYTOSTEROL ESTERS PO Take 2 tablets by mouth daily. 08/30/2015: Plant sterol for cholesterol.  Takes 2/day (supposed to take 4)  . Triamcinolone Acetonide (NASACORT AQ NA) Place 2 sprays into the nose daily.   Marland Kitchen ALPRAZolam (XANAX) 0.5 MG tablet Take 0.5 mg by mouth at bedtime as needed for anxiety. Reported on 01/17/2016 08/30/2015: For flying and mammo day  .  valACYclovir (VALTREX) 1000 MG tablet Take 2 tablets at onset of cold sore and repeat dose in 12 hours (4 tabs/course) (Patient not taking: Reported on 10/04/2015)   . [DISCONTINUED] amoxicillin-clavulanate (AUGMENTIN) 875-125 MG tablet Take 1 tablet by mouth 2 (two) times daily.   . [DISCONTINUED] fexofenadine (ALLEGRA) 180 MG tablet Take 180 mg by mouth daily.   . [DISCONTINUED] fluticasone (FLONASE) 50 MCG/ACT nasal spray Place 2 sprays into both nostrils daily.    No facility-administered encounter medications on file as of 01/17/2016.    Allergies  Allergen Reactions  . Demerol [Meperidine] Other (See Comments)    Low bp, passes out   ROS:  The patient denies anorexia, fever, weight changes, headaches,  vision changes, decreased hearing, ear pain, sore throat, breast concerns, chest pain, palpitations, dizziness, syncope, dyspnea on exertion, cough, swelling, nausea, vomiting, diarrhea, constipation, abdominal pain, melena, hematochezia, indigestion/heartburn, hematuria, incontinence, dysuria, vaginal bleeding, discharge, odor or itch, genital lesions, joint pains, numbness, tingling, weakness, tremor, suspicious skin lesions, depression, anxiety, abnormal bleeding/bruising, or enlarged lymph nodes. Some issues with frozen shoulder on the left, occasional tingling in the  arm. Congestion, cough, ear popping over the last few days, per HPI.   PHYSICAL EXAM:  BP 120/74 mmHg  Pulse 68  Temp(Src) 98.5 F (36.9 C) (Tympanic)  Ht 5' 5.75" (1.67 m)  Wt 170 lb (77.111 kg)  BMI 27.65 kg/m2  LMP 11/18/2008  General Appearance:    Alert, cooperative, no distress, appears stated age  Head:    Normocephalic, without obvious abnormality, atraumatic  Eyes:    PERRL, conjunctiva/corneas clear, EOM's intact, fundi    benign  Ears:    Normal TM's and external ear canals  Nose:   Nares normal, mucosa with mild-mod edema, no purulent drainage or sinus tenderness  Throat:   Lips, mucosa, and tongue normal; teeth and gums normal  Neck:   Supple, no lymphadenopathy;  thyroid:  no   enlargement/tenderness/nodules; no carotid   bruit or JVD  Back:    Spine nontender, no curvature, ROM normal, no CVA     tenderness  Lungs:     Clear to auscultation bilaterally without wheezes, rales or     ronchi; respirations unlabored  Chest Wall:    No tenderness or deformity   Heart:    Regular rate and rhythm, S1 and S2 normal, no murmur, rub   or gallop  Breast Exam:    No dimpling, nipple discharge or inversion. No axillary lymphadenopathy. She has some fibroglandular changes that are tender in the UOQ bilaterally  Abdomen:     Soft, non-tender, nondistended, normoactive bowel sounds,    no masses, no hepatosplenomegaly  Genitalia:    Normal external genitalia without lesions; mild atrophic changes.  BUS and vagina normal; cervix without lesions, or cervical motion tenderness. No abnormal vaginal discharge.  Uterus and adnexa not enlarged, nontender, no masses.  Pap performed  Rectal:    Normal tone, no masses or tenderness; guaiac negative stool  Extremities:   No clubbing, cyanosis or edema  Pulses:   2+ and symmetric all extremities  Skin:   Skin color, texture, turgor normal, no rashes or lesions  Lymph nodes:   Cervical, supraclavicular, and axillary nodes normal  Neurologic:    CNII-XII intact, normal strength, sensation and gait; reflexes 2+ and symmetric throughout          Psych:   Normal mood, affect, hygiene and grooming.     ASSESSMENT/PLAN:  Annual physical exam - Plan:  POCT Urinalysis Dipstick, Lipid panel, Comprehensive metabolic panel, CBC with Differential/Platelet, VITAMIN D 25 Hydroxy (Vit-D Deficiency, Fractures), Cytology - PAP [place]  Allergic rhinitis, unspecified allergic rhinitis type - continue current regimen  Depression, major, in remission ([place]) - continue citalopram  Generalized anxiety disorder  Multinodular goiter - TSH to be checked next month by Dr. Chalmers Cater  Herpes labialis - continue prn valtrex; briefly discussed daily use if ongoing frequent flares  Pure hypercholesterolemia - check today; low cholesterol diet briefly reviewed, and LDL goals. - Plan: Lipid panel  Macrocytosis - Plan: CBC with Differential/Platelet  Other fatigue - Plan: Comprehensive metabolic panel, CBC with Differential/Platelet, VITAMIN D 25 Hydroxy (Vit-D Deficiency, Fractures)  Acute upper respiratory infection - vs flare of allergies. supportive measures reviewed, as well as s/sx of infection   c-met, CBC, lipids, Vit D Pap. Hep C screen not needed due to regular blood donor  URI vs allergies--supportive measures reviewed.  Discussed monthly self breast exams and yearly mammograms; at least 30 minutes of aerobic activity at least 5 days/week, weight-bearing exercise at least 2x/wk; proper sunscreen use reviewed; healthy diet, including goals of calcium and vitamin D intake and alcohol recommendations (less than or equal to 1 drink/day) reviewed; regular seatbelt use; changing batteries in smoke detectors.  Immunization recommendations discussed, UTD. zostavax at 46, sooner if covered by insurance. Risks/side effects/benefits reviewed in detail.  Colonoscopy recommendations reviewed, UTD    I recommend taking a 12 hour Mucinex (plain or the DM if  you have a lot of cough) to help keep the mucus and phlegm thin. Take it twice daily. Add a 12 hour sudafed/pseudoephedrine, taken once daily in the morning (can be taken twice daily if it doesn't cause insomnia. Use these additional measures just while your congestion is worse, as needed.

## 2016-01-18 ENCOUNTER — Other Ambulatory Visit: Payer: Self-pay | Admitting: *Deleted

## 2016-01-18 DIAGNOSIS — E559 Vitamin D deficiency, unspecified: Secondary | ICD-10-CM | POA: Insufficient documentation

## 2016-01-18 DIAGNOSIS — R7989 Other specified abnormal findings of blood chemistry: Secondary | ICD-10-CM

## 2016-01-18 DIAGNOSIS — R945 Abnormal results of liver function studies: Secondary | ICD-10-CM

## 2016-01-18 LAB — VITAMIN D 25 HYDROXY (VIT D DEFICIENCY, FRACTURES): Vit D, 25-Hydroxy: 20 ng/mL — ABNORMAL LOW (ref 30–100)

## 2016-01-18 MED ORDER — VITAMIN D (ERGOCALCIFEROL) 1.25 MG (50000 UNIT) PO CAPS: 50000.0000 [IU] | ORAL_CAPSULE | ORAL | Status: DC

## 2016-01-18 NOTE — Addendum Note (Signed)
Addended by: Rita Ohara on: 01/18/2016 08:46 AM   Modules accepted: Orders

## 2016-01-19 LAB — CYTOLOGY - PAP

## 2016-01-31 LAB — HM MAMMOGRAPHY: HM MAMMO: NEGATIVE

## 2016-02-05 ENCOUNTER — Encounter: Payer: Self-pay | Admitting: *Deleted

## 2016-02-08 ENCOUNTER — Other Ambulatory Visit: Payer: Self-pay | Admitting: Endocrinology

## 2016-02-08 DIAGNOSIS — E039 Hypothyroidism, unspecified: Secondary | ICD-10-CM

## 2016-02-23 ENCOUNTER — Ambulatory Visit
Admission: RE | Admit: 2016-02-23 | Discharge: 2016-02-23 | Disposition: A | Discharge status: Home / Self Care | Payer: BLUE CROSS/BLUE SHIELD | Source: Ambulatory Visit | Attending: Endocrinology | Admitting: Endocrinology

## 2016-02-23 DIAGNOSIS — E039 Hypothyroidism, unspecified: Secondary | ICD-10-CM

## 2016-03-06 ENCOUNTER — Encounter: Payer: BLUE CROSS/BLUE SHIELD | Admitting: Family Medicine

## 2016-05-06 ENCOUNTER — Other Ambulatory Visit: Payer: BLUE CROSS/BLUE SHIELD

## 2016-05-06 DIAGNOSIS — E559 Vitamin D deficiency, unspecified: Secondary | ICD-10-CM

## 2016-05-06 DIAGNOSIS — R945 Abnormal results of liver function studies: Principal | ICD-10-CM

## 2016-05-06 DIAGNOSIS — R7989 Other specified abnormal findings of blood chemistry: Secondary | ICD-10-CM

## 2016-05-06 LAB — HEPATIC FUNCTION PANEL
ALBUMIN: 4.3 g/dL (ref 3.6–5.1)
ALT: 36 U/L — ABNORMAL HIGH (ref 6–29)
AST: 23 U/L (ref 10–35)
Alkaline Phosphatase: 77 U/L (ref 33–130)
BILIRUBIN INDIRECT: 0.6 mg/dL (ref 0.2–1.2)
Bilirubin, Direct: 0.1 mg/dL (ref <=0.2)
TOTAL PROTEIN: 6.8 g/dL (ref 6.1–8.1)
Total Bilirubin: 0.7 mg/dL (ref 0.2–1.2)

## 2016-05-07 LAB — VITAMIN D 25 HYDROXY (VIT D DEFICIENCY, FRACTURES): VIT D 25 HYDROXY: 30 ng/mL (ref 30–100)

## 2016-08-30 ENCOUNTER — Ambulatory Visit (INDEPENDENT_AMBULATORY_CARE_PROVIDER_SITE_OTHER): Payer: BLUE CROSS/BLUE SHIELD | Admitting: Family Medicine

## 2016-08-30 ENCOUNTER — Encounter: Payer: Self-pay | Admitting: Family Medicine

## 2016-08-30 VITALS — BP 110/64 | HR 77 | Temp 98.1°F | Wt 168.4 lb

## 2016-08-30 DIAGNOSIS — J209 Acute bronchitis, unspecified: Secondary | ICD-10-CM | POA: Diagnosis not present

## 2016-08-30 DIAGNOSIS — R05 Cough: Secondary | ICD-10-CM | POA: Diagnosis not present

## 2016-08-30 DIAGNOSIS — R059 Cough, unspecified: Secondary | ICD-10-CM

## 2016-08-30 MED ORDER — AMOXICILLIN-POT CLAVULANATE 875-125 MG PO TABS: 1.0000 | ORAL_TABLET | Freq: Two times a day (BID) | ORAL | 0 refills | Status: DC

## 2016-08-30 MED ORDER — BENZONATATE 200 MG PO CAPS: 200.0000 mg | ORAL_CAPSULE | Freq: Two times a day (BID) | ORAL | 0 refills | Status: DC | PRN

## 2016-08-30 MED ORDER — AZITHROMYCIN 250 MG PO TABS: ORAL_TABLET | ORAL | 0 refills | Status: DC

## 2016-08-30 NOTE — Progress Notes (Signed)
Subjective:  Rachel Juarez is a 60 y.o. female who presents for possible bronchitis.  Symptoms include a week history of cough and runny nose. States cough is getting worse and she is having some chest tightness with cough. Occasionally has productive thick sputum.  Denies fever, chills, sinus pressure, ear pain, sore throat, abdominal pain, N/V/D.   Treatment to date: mucinex D, advil.  No sick contacts.   She does not smoke.   She does not have a history of bronchitis.   No other aggravating or relieving factors.  No other c/o. No recent antibiotic use.  Immunocompetent.   The following portions of the patient's history were reviewed and updated as appropriate: allergies, current medications, past family history, past medical history, past social history, past surgical history and problem list.  ROS as in subjective  Past Medical History:  Diagnosis Date  . Allergy   . Anxiety   . Depression, major, in remission ([place]) 2006   when daughter dx'd with autoimmune disorder  . Generalized anxiety disorder    generalized, and related to mammograms and flying  . Glaucoma suspect    sees Dr. Katy Fitch, has all been okay, sees him qoyr  . Herpes labialis   . Hypercholesterolemia    high HDL, borderline LDL  . Hypothyroidism    treated by Dr. Chalmers Cater  . Multinodular goiter    followed by Dr. Chalmers Cater; u/s q2 yr  . 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 Seasonal allergies    takes singulair year-round; worse in the Fall     Objective: Vital signs reviewed BP 110/64   Pulse 77   Temp 98.1 F (36.7 C) (Oral)   Wt 168 lb 6.4 oz (76.4 kg)   LMP 11/18/2008   BMI 27.39 kg/m    General appearance: Alert, WD/WN, no distress, ill appearing                             Skin: warm, no rash, no diaphoresis                           Head: no sinus tenderness                            Eyes: conjunctiva normal, corneas clear, PERRLA                            Ears: pearly TMs, external ear  canals normal                          Nose: septum midline, turbinates swollen, with erythema and clear discharge             Mouth/throat: MMM, tongue normal, mild pharyngeal erythema                           Neck: supple, no adenopathy, no thyromegaly, nontender                          Heart: RRR, normal S1, S2, no murmurs                         Lungs: +bronchial breath sounds, +  scattered rhonchi, no wheezes, no rales                Extremities: no edema, nontender     Assessment: Acute bronchitis, unspecified organism  Cough   Plan:  Medication orders today include: Augmentin and Tessalon.   Discussed diagnosis and treatment of bronchitis.  Suggested symptomatic OTC remedies for cough and congestion.  Tylenol or Ibuprofen OTC for fever and malaise.  Call/return if not back to baseline after completing the antibiotic or not improving in 2-3 days.  Advised that cough may linger even after the infection is improved.

## 2016-09-07 ENCOUNTER — Other Ambulatory Visit: Payer: Self-pay | Admitting: Family Medicine

## 2016-09-07 DIAGNOSIS — J302 Other seasonal allergic rhinitis: Secondary | ICD-10-CM

## 2016-11-06 ENCOUNTER — Other Ambulatory Visit: Payer: Self-pay | Admitting: Family Medicine

## 2016-11-06 DIAGNOSIS — F325 Major depressive disorder, single episode, in full remission: Secondary | ICD-10-CM

## 2016-11-06 DIAGNOSIS — F411 Generalized anxiety disorder: Secondary | ICD-10-CM

## 2016-11-06 DIAGNOSIS — B001 Herpesviral vesicular dermatitis: Secondary | ICD-10-CM

## 2016-11-06 NOTE — Telephone Encounter (Signed)
Is this okay to refill? 

## 2016-12-11 ENCOUNTER — Other Ambulatory Visit: Payer: Self-pay | Admitting: Family Medicine

## 2016-12-11 DIAGNOSIS — J302 Other seasonal allergic rhinitis: Secondary | ICD-10-CM

## 2017-01-01 ENCOUNTER — Telehealth: Payer: Self-pay | Admitting: Family Medicine

## 2017-01-01 DIAGNOSIS — E7800 Pure hypercholesterolemia, unspecified: Secondary | ICD-10-CM

## 2017-01-01 DIAGNOSIS — R945 Abnormal results of liver function studies: Secondary | ICD-10-CM

## 2017-01-01 DIAGNOSIS — Z5181 Encounter for therapeutic drug level monitoring: Secondary | ICD-10-CM

## 2017-01-01 DIAGNOSIS — E042 Nontoxic multinodular goiter: Secondary | ICD-10-CM

## 2017-01-01 DIAGNOSIS — Z0000 Encounter for general adult medical examination without abnormal findings: Secondary | ICD-10-CM

## 2017-01-01 DIAGNOSIS — E559 Vitamin D deficiency, unspecified: Secondary | ICD-10-CM

## 2017-01-01 DIAGNOSIS — R7989 Other specified abnormal findings of blood chemistry: Secondary | ICD-10-CM

## 2017-01-01 NOTE — Addendum Note (Signed)
Addended by: Rita Ohara on: 01/01/2017 05:24 PM   Modules accepted: Orders

## 2017-01-01 NOTE — Telephone Encounter (Signed)
Pt would like to come in  before her physical on feb the 22nd and have her labs done so you and her can discuss them if that is ok pt can be reached at [phone]

## 2017-01-01 NOTE — Telephone Encounter (Signed)
Future orders entered. 

## 2017-01-03 NOTE — Telephone Encounter (Signed)
Made pt appt for fasting labs

## 2017-01-13 ENCOUNTER — Other Ambulatory Visit: Payer: BLUE CROSS/BLUE SHIELD

## 2017-01-13 DIAGNOSIS — R945 Abnormal results of liver function studies: Secondary | ICD-10-CM

## 2017-01-13 DIAGNOSIS — Z0000 Encounter for general adult medical examination without abnormal findings: Secondary | ICD-10-CM

## 2017-01-13 DIAGNOSIS — Z5181 Encounter for therapeutic drug level monitoring: Secondary | ICD-10-CM

## 2017-01-13 DIAGNOSIS — E559 Vitamin D deficiency, unspecified: Secondary | ICD-10-CM

## 2017-01-13 DIAGNOSIS — R7989 Other specified abnormal findings of blood chemistry: Secondary | ICD-10-CM

## 2017-01-13 DIAGNOSIS — E7800 Pure hypercholesterolemia, unspecified: Secondary | ICD-10-CM

## 2017-01-13 LAB — LIPID PANEL
CHOLESTEROL: 253 mg/dL — AB (ref <200)
HDL: 88 mg/dL (ref >50)
LDL Cholesterol: 146 mg/dL — ABNORMAL HIGH (ref <100)
TRIGLYCERIDES: 96 mg/dL (ref <150)
Total CHOL/HDL Ratio: 2.9 Ratio (ref <5.0)
VLDL: 19 mg/dL (ref <30)

## 2017-01-13 LAB — CBC WITH DIFFERENTIAL/PLATELET
Basophils Absolute: 35 cells/uL (ref 0–200)
Basophils Relative: 1 %
EOS PCT: 4 %
Eosinophils Absolute: 140 cells/uL (ref 15–500)
HCT: 44.2 % (ref 35.0–45.0)
Hemoglobin: 14.6 g/dL (ref 11.7–15.5)
LYMPHS PCT: 41 %
Lymphs Abs: 1435 cells/uL (ref 850–3900)
MCH: 33.3 pg — ABNORMAL HIGH (ref 27.0–33.0)
MCHC: 33 g/dL (ref 32.0–36.0)
MCV: 100.7 fL — ABNORMAL HIGH (ref 80.0–100.0)
MPV: 10.6 fL (ref 7.5–12.5)
Monocytes Absolute: 280 cells/uL (ref 200–950)
Monocytes Relative: 8 %
NEUTROS PCT: 46 %
Neutro Abs: 1610 cells/uL (ref 1500–7800)
Platelets: 241 10*3/uL (ref 140–400)
RBC: 4.39 MIL/uL (ref 3.80–5.10)
RDW: 13.8 % (ref 11.0–15.0)
WBC: 3.5 10*3/uL — AB (ref 4.0–10.5)

## 2017-01-13 LAB — COMPREHENSIVE METABOLIC PANEL
ALK PHOS: 67 U/L (ref 33–130)
ALT: 52 U/L — AB (ref 6–29)
AST: 34 U/L (ref 10–35)
Albumin: 4.5 g/dL (ref 3.6–5.1)
BUN: 12 mg/dL (ref 7–25)
CALCIUM: 10 mg/dL (ref 8.6–10.4)
CO2: 28 mmol/L (ref 20–31)
Chloride: 104 mmol/L (ref 98–110)
Creat: 0.81 mg/dL (ref 0.50–1.05)
Glucose, Bld: 93 mg/dL (ref 65–99)
POTASSIUM: 5 mmol/L (ref 3.5–5.3)
Sodium: 140 mmol/L (ref 135–146)
TOTAL PROTEIN: 7.1 g/dL (ref 6.1–8.1)
Total Bilirubin: 0.7 mg/dL (ref 0.2–1.2)

## 2017-01-13 LAB — TSH: TSH: 1.23 mIU/L

## 2017-01-14 LAB — VITAMIN D 25 HYDROXY (VIT D DEFICIENCY, FRACTURES): VIT D 25 HYDROXY: 36 ng/mL (ref 30–100)

## 2017-01-15 NOTE — Progress Notes (Signed)
Chief Complaint  Patient presents with  . Annual Exam    nonfasting (labs already done) annual exam with pelvic. Having eye exam in March with Dr.Groat. Having some URI symptoms that started Tuesday, coughing, ST-no chills or body aches. Feels like she cannot take a deep breath.     Rachel Juarez is a 61 y.o. female who presents for a complete physical.  She has the following concerns:  URI symptoms x 2 days.  "just a head cold".  No nasal drainage.  Has sore throat and cough.  No fever, myalgias.  Feels like allergies.  Hypothyroidism/multinodular goiter--under the care of Dr. Chalmers Cater. Last ultrasound 01/2016: IMPRESSION: 1. Stable tiny (4 mm) left upper pole thyroid nodule. 2. New versus previously un seen small (5 mm) hypoechoic nodule in the right upper gland. 3. Diffusely heterogeneous background thyroid parenchyma similar compared to prior.  Herpes labialis--uses Valtrex prn.  Had more flares over the last summer.  Thinks she may need a refill soon (last filled #20 in December).  Depression and anxiety--diagnosed when her daughter was diagnosed with auto-immune disorder over 10 years ago. She has been on the medication ever since. Her daughter is doing well with treatment. She denies any depression. Has anxiety related to getting mammograms and flying, and uses xanax prn in those situations. She has never tried a lower dose of coming off of the medication.  Still has some stressors related to husband's business.  Prefers not to change at this time.  Flying to [place] next week, and mammo is due in March.  Needs refill on xanax.  Allergies: They are year-round but seasonal component, worse in the spring, also flares in the fall. She takes singulair daily, and nasal steroid (nasacort) and antihistamine (allegra).  Hyperlipidemia:  She tries to take plant sterols and fish oil, and follow a low cholesterol diet. Typical diet contains sausage and red meat once weekly (or less, mostly  eats chicken). 1 egg/week.  Vinaigrette dressings. +cheese, not daily. Mayo on sandwiches. She had lipids checked prior to her visit today (see below).   Macrocytosis.  previously had B12 in the 330 range. MCV was at 100, prev up to 104 per last records.  Pt recalls seeing hematologist in the past.  No work-up was needed.   Immunization History  Administered Date(s) Administered  . Influenza,inj,Quad PF,36+ Mos 08/30/2015  . Tdap 02/12/2011   [facility] Last Pap smear: 12/2015, normal, no high risk HPV detected Last mammogram:  01/2016 Last colonoscopy: 01/2008 Dr. Redmond School Last DEXA: 02/2015 (at Dr. Almetta Lovely office; told she had osteopenia in one hip) Dentist: twice a year Ophtho: Dr. Katy Fitch, yearly (following "weird optic nerve"), scheduled for March Exercise:  No longer walking as often (previously 3x/week), walks the dog.  <150 min/week.  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 She reports that level 2 and 3 yoga has cardio.   Past Medical History:  Diagnosis Date  . Allergy   . Anxiety   . Depression, major, in remission ([place]) 2006   when daughter dx'd with autoimmune disorder  . Generalized anxiety disorder    generalized, and related to mammograms and flying  . Glaucoma suspect    sees Dr. Katy Fitch, has all been okay, sees him qoyr  . Herpes labialis   . Hypercholesterolemia    high HDL, borderline LDL  . Hypothyroidism    treated by Dr. Chalmers Cater  . Multinodular goiter    followed by Dr.  Balan; u/s q2 yr  . 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 Seasonal allergies    takes singulair year-round; worse in the Fall    History reviewed. No pertinent surgical history.  Social History   Social History  . Marital status: Married    Spouse name: N/A  . Number of children: N/A  . Years of education: N/A   Occupational History  . yoga teacher    Social History Main Topics  . Smoking status: Never Smoker  .  Smokeless tobacco: Never Used  . Alcohol use 0.0 oz/week     Comment: 1-2 glasses of wine per day with dinner  . Drug use: No  . Sexual activity: Yes    Partners: Male    Birth control/ protection: Post-menopausal   Other Topics Concern  . Not on file   Social History Narrative   Teaches yoga at churches, [employer], and 2 yoga studios. Works part-time for [employer].    Lives with her husband, and she has a college-age daughter Web designer at [employer]).   1 dog (Malti-poo)    Family History  Problem Relation Age of Onset  . Lupus Mother   . Cancer Father 68    thymic cancer  . COPD Father   . Heart disease Father     CHF  . Asthma Father   . Dermatomyositis Daughter 45    juvenile form  . Irritable bowel syndrome Daughter   . Colon cancer Maternal Grandmother     80's  . Cancer Maternal Grandmother   . Hypertension Paternal Grandmother   . Hyperlipidemia Paternal Grandmother   . Thyroid disease Paternal Grandmother     had goiter removed  . Stroke Paternal Grandmother 81  . Hypertension Paternal Grandfather   . Hyperlipidemia Paternal Grandfather   . Heart disease Paternal Grandfather     MI first in his 79's  . Stroke Paternal Grandfather   . Cancer Paternal Uncle     started on tonsils (smoker)  . Breast cancer Paternal Aunt 65  . Diabetes Neg Hx   . Ovarian cancer Neg Hx     Outpatient Encounter Prescriptions as of 01/16/2017  Medication Sig Note  . ALPRAZolam (XANAX) 0.5 MG tablet Take 0.5 mg by mouth at bedtime as needed for anxiety. Reported on 01/17/2016 08/30/2015: For flying and mammo day  . aspirin 81 MG tablet Take 81 mg by mouth daily.   . Cholecalciferol (VITAMIN D3) LIQD Take 2,000 Int'l Units by mouth daily.   . citalopram (CELEXA) 40 MG tablet TAKE 1 TABLET (40 MG TOTAL) BY MOUTH DAILY.   . fexofenadine (ALLEGRA) 180 MG tablet Take 180 mg by mouth daily.   Marland Kitchen levothyroxine (SYNTHROID, LEVOTHROID) 75 MCG tablet Take 75 mcg by mouth daily before  breakfast.   . MAGNESIUM-CALCIUM-FOLIC ACID PO Take by mouth.   . Misc Natural Products (TART CHERRY ADVANCED PO) Take 1 capsule by mouth daily.   . montelukast (SINGULAIR) 10 MG tablet TAKE 1 TABLET (10 MG TOTAL) BY MOUTH AT BEDTIME.   . Multiple Vitamins-Minerals (MULTIVITAMIN WITH MINERALS) tablet Take 1 tablet by mouth daily.   . Omega-3 Fatty Acids (FISH OIL) 1000 MG CPDR Take 2,000 mg by mouth daily.   Marland Kitchen PHYTOSTEROL ESTERS PO Take 2 tablets by mouth daily. 08/30/2015: Plant sterol for cholesterol.  Takes 2/day (supposed to take 4)  . Triamcinolone Acetonide (NASACORT AQ NA) Place 2 sprays into the nose daily.   Marland Kitchen  valACYclovir (VALTREX) 1000 MG tablet TAKE 2 TABLETS AT ONSET OF COLD SORE AND REPEAT DOSE IN 12 HOURS (4 TABS/COURSE)   . [DISCONTINUED] loratadine (CLARITIN) 10 MG tablet Take 10 mg by mouth daily.   . [DISCONTINUED] amoxicillin-clavulanate (AUGMENTIN) 875-125 MG tablet Take 1 tablet by mouth 2 (two) times daily.   . [DISCONTINUED] benzonatate (TESSALON) 200 MG capsule Take 1 capsule (200 mg total) by mouth 2 (two) times daily as needed for cough.    No facility-administered encounter medications on file as of 01/16/2017.     Allergies  Allergen Reactions  . Demerol [Meperidine] Other (See Comments)    Low bp, passes out    ROS:  The patient denies anorexia, fever, weight changes, headaches,  vision changes, decreased hearing, ear pain, sore throat, breast concerns, chest pain, palpitations, dizziness, syncope, dyspnea on exertion, cough, swelling, nausea, vomiting, diarrhea, constipation, abdominal pain, melena, hematochezia, indigestion/heartburn, hematuria, incontinence, dysuria, vaginal bleeding, discharge, odor or itch, genital lesions, joint pains, numbness, tingling, weakness, tremor, suspicious skin lesions, depression, anxiety, abnormal bleeding/bruising, or enlarged lymph nodes. Some issues with intermittent left shoulder pain, sometimes has discomfort related to her  yoga movements. URI symptoms per HPI. She had loose stools for a while  In January, improved.  Some change in diet (eating more casseroles at work).    PHYSICAL EXAM:  BP 112/70 (Patient Position: Sitting, Cuff Size: Normal)   Pulse 72   Temp 99.5 F (37.5 C) (Tympanic)   Ht 5' 5.5" (1.664 m)   Wt 166 lb 9.6 oz (75.6 kg)   LMP 11/18/2008   BMI 27.30 kg/m   General Appearance:    Alert, cooperative, no distress, appears stated age. Occasional throat clearing and cough during visit.  Head:    Normocephalic, without obvious abnormality, atraumatic  Eyes:    PERRL, conjunctiva/corneas clear, EOM's intact, fundi    benign  Ears:    Normal TM's and external ear canals  Nose:   Nares normal, mucosa with mild-mod edema, no purulent drainage or sinus tenderness  Throat:   Lips, mucosa, and tongue normal; teeth and gums normal  Neck:   Supple, no lymphadenopathy;  thyroid: mildly enlarged, nodular, more prominent on the right; no carotid bruit or JVD  Back:    Spine nontender, no curvature, ROM normal, no CVA     tenderness  Lungs:     Clear to auscultation bilaterally without wheezes, rales or     ronchi; respirations unlabored  Chest Wall:    No tenderness or deformity   Heart:    Regular rate and rhythm, S1 and S2 normal, no murmur, rub   or gallop  Breast Exam:    No dimpling, nipple discharge or inversion. No axillary lymphadenopathy. She has some fibroglandular changes that are tender in the left UOQ; normal on the right.  Abdomen:     Soft, non-tender, nondistended, normoactive bowel sounds,    no masses, no hepatosplenomegaly  Genitalia:    Normal external genitalia without lesions; mild atrophic changes.  BUS and vagina normal; no cervical motion tenderness. No abnormal vaginal discharge.  Uterus and adnexa not enlarged, nontender, no masses.  Pap not performed  Rectal:    Normal tone, no masses or tenderness; guaiac negative stool  Extremities:   No clubbing, cyanosis or edema   Pulses:   2+ and symmetric all extremities  Skin:   Skin color, texture, turgor normal, no rashes or lesions  Lymph nodes:   Cervical, supraclavicular, and axillary nodes normal  Neurologic:   CNII-XII intact, normal strength, sensation and gait; reflexes 2+ and symmetric throughout                                  Psych:  Normal mood, affect, hygiene and grooming    Chemistry      Component Value Date/Time   NA 140 01/13/2017 0830   K 5.0 01/13/2017 0830   CL 104 01/13/2017 0830   CO2 28 01/13/2017 0830   BUN 12 01/13/2017 0830   CREATININE 0.81 01/13/2017 0830      Component Value Date/Time   CALCIUM 10.0 01/13/2017 0830   ALKPHOS 67 01/13/2017 0830   AST 34 01/13/2017 0830   ALT 52 (H) 01/13/2017 0830   BILITOT 0.7 01/13/2017 0830     Fasting glucose 93  Vitamin D-OH 36  Lab Results  Component Value Date   WBC 3.5 (L) 01/13/2017   HGB 14.6 01/13/2017   HCT 44.2 01/13/2017   MCV 100.7 (H) 01/13/2017   PLT 241 01/13/2017   Lab Results  Component Value Date   TSH 1.23 01/13/2017   Lab Results  Component Value Date   CHOL 253 (H) 01/13/2017   HDL 88 01/13/2017   LDLCALC 146 (H) 01/13/2017   TRIG 96 01/13/2017   CHOLHDL 2.9 01/13/2017    ASSESSMENT/PLAN:  Annual physical exam - Plan: POCT Urinalysis Dipstick, CBC with Differential/Platelet, Lipid panel, Comprehensive metabolic panel  Pure hypercholesterolemia - reviewed lowfat, low cholesterol diet in detail.  can call if desires lab visit to recheck sooner than next year - Plan: Lipid panel  Multinodular goiter - stable. Under care of Dr. Chalmers Cater  Depression, major, in remission ([place]) - Plan: citalopram (CELEXA) 40 MG tablet  Generalized anxiety disorder - Plan: citalopram (CELEXA) 40 MG tablet  Herpes labialis - Plan: valACYclovir (VALTREX) 1000 MG tablet  Anxiety - Plan: ALPRAZolam (XANAX) 0.5 MG tablet  Viral upper respiratory tract infection - supportive measures. no evidence of bacterial  infection  Elevated LFTs - slightly higher than in past; encouraged cutting back on alcohol to 1/d or less.    Discussed monthly self breast exams and yearly mammograms; at least 30 minutes of aerobic activity at least 5 days/week, weight-bearing exercise at least 2x/wk; proper sunscreen use reviewed; healthy diet, including goals of calcium and vitamin D intake and alcohol recommendations (less than or equal to 1 drink/day) reviewed; regular seatbelt use; changing batteries in smoke detectors.  Immunization recommendations discussed, UTD. Shingrix recommended when available.  Colonoscopy recommendations reviewed, UTD   F/u 1 year with labs prior CBC, c-met, lipids NO TSH or D needed

## 2017-01-16 ENCOUNTER — Encounter: Payer: Self-pay | Admitting: Family Medicine

## 2017-01-16 ENCOUNTER — Ambulatory Visit (INDEPENDENT_AMBULATORY_CARE_PROVIDER_SITE_OTHER): Payer: BLUE CROSS/BLUE SHIELD | Admitting: Family Medicine

## 2017-01-16 ENCOUNTER — Encounter: Payer: BLUE CROSS/BLUE SHIELD | Admitting: Medical

## 2017-01-16 VITALS — BP 112/70 | HR 72 | Temp 99.5°F | Ht 65.5 in | Wt 166.6 lb

## 2017-01-16 DIAGNOSIS — R7989 Other specified abnormal findings of blood chemistry: Secondary | ICD-10-CM

## 2017-01-16 DIAGNOSIS — E7800 Pure hypercholesterolemia, unspecified: Secondary | ICD-10-CM

## 2017-01-16 DIAGNOSIS — B001 Herpesviral vesicular dermatitis: Secondary | ICD-10-CM

## 2017-01-16 DIAGNOSIS — F411 Generalized anxiety disorder: Secondary | ICD-10-CM | POA: Diagnosis not present

## 2017-01-16 DIAGNOSIS — F419 Anxiety disorder, unspecified: Secondary | ICD-10-CM | POA: Diagnosis not present

## 2017-01-16 DIAGNOSIS — F325 Major depressive disorder, single episode, in full remission: Secondary | ICD-10-CM | POA: Diagnosis not present

## 2017-01-16 DIAGNOSIS — E042 Nontoxic multinodular goiter: Secondary | ICD-10-CM

## 2017-01-16 DIAGNOSIS — B9789 Other viral agents as the cause of diseases classified elsewhere: Secondary | ICD-10-CM | POA: Diagnosis not present

## 2017-01-16 DIAGNOSIS — Z0000 Encounter for general adult medical examination without abnormal findings: Secondary | ICD-10-CM | POA: Diagnosis not present

## 2017-01-16 DIAGNOSIS — J069 Acute upper respiratory infection, unspecified: Secondary | ICD-10-CM

## 2017-01-16 DIAGNOSIS — R945 Abnormal results of liver function studies: Secondary | ICD-10-CM

## 2017-01-16 LAB — POCT URINALYSIS DIPSTICK
Bilirubin, UA: NEGATIVE
GLUCOSE UA: NEGATIVE
KETONES UA: NEGATIVE
Leukocytes, UA: NEGATIVE
Nitrite, UA: NEGATIVE
Protein, UA: NEGATIVE
RBC UA: NEGATIVE
SPEC GRAV UA: 1.015
UROBILINOGEN UA: NEGATIVE
pH, UA: 7.5

## 2017-01-16 MED ORDER — VALACYCLOVIR HCL 1 G PO TABS: ORAL_TABLET | ORAL | 1 refills | Status: DC

## 2017-01-16 MED ORDER — ALPRAZOLAM 0.5 MG PO TABS: 0.2500 mg | ORAL_TABLET | Freq: Three times a day (TID) | ORAL | 0 refills | Status: DC | PRN

## 2017-01-16 MED ORDER — CITALOPRAM HYDROBROMIDE 40 MG PO TABS: 40.0000 mg | ORAL_TABLET | Freq: Every day | ORAL | 3 refills | Status: DC

## 2017-01-16 NOTE — Patient Instructions (Signed)
  HEALTH MAINTENANCE RECOMMENDATIONS:  It is recommended that you get at least 30 minutes of aerobic exercise at least 5 days/week (for weight loss, you may need as much as 60-90 minutes). This can be any activity that gets your heart rate up. This can be divided in 10-15 minute intervals if needed, but try and build up your endurance at least once a week.  Weight bearing exercise is also recommended twice weekly.  Eat a healthy diet with lots of vegetables, fruits and fiber.  "Colorful" foods have a lot of vitamins (ie green vegetables, tomatoes, red peppers, etc).  Limit sweet tea, regular sodas and alcoholic beverages, all of which has a lot of calories and sugar.  Up to 1 alcoholic drink daily may be beneficial for women (unless trying to lose weight, watch sugars).  Drink a lot of water.  Calcium recommendations are 1200-1500 mg daily (1500 mg for postmenopausal women or women without ovaries), and vitamin D 1000 IU daily.  This should be obtained from diet and/or supplements (vitamins), and calcium should not be taken all at once, but in divided doses.  Monthly self breast exams and yearly mammograms for women over the age of 77 is recommended.  Sunscreen of at least SPF 30 should be used on all sun-exposed parts of the skin when outside between the hours of 10 am and 4 pm (not just when at beach or pool, but even with exercise, golf, tennis, and yard work!)  Use a sunscreen that says "broad spectrum" so it covers both UVA and UVB rays, and make sure to reapply every 1-2 hours.  Remember to change the batteries in your smoke detectors when changing your clock times in the spring and fall.  Use your seat belt every time you are in a car, and please drive safely and not be distracted with cell phones and texting while driving.  I recommend getting the new shingles vaccine (Shingrix) when available. You will need to check with your insurance to see if it is covered.  It is a series of 2  injections, spaced 2 months apart.

## 2017-01-17 ENCOUNTER — Telehealth: Payer: Self-pay | Admitting: Family Medicine

## 2017-01-17 ENCOUNTER — Encounter: Payer: Self-pay | Admitting: Medical

## 2017-01-17 ENCOUNTER — Ambulatory Visit (INDEPENDENT_AMBULATORY_CARE_PROVIDER_SITE_OTHER): Payer: BLUE CROSS/BLUE SHIELD | Admitting: Medical

## 2017-01-17 VITALS — BP 108/70 | HR 88 | Temp 98.7°F | Wt 167.8 lb

## 2017-01-17 DIAGNOSIS — J029 Acute pharyngitis, unspecified: Secondary | ICD-10-CM

## 2017-01-17 DIAGNOSIS — J40 Bronchitis, not specified as acute or chronic: Secondary | ICD-10-CM | POA: Diagnosis not present

## 2017-01-17 DIAGNOSIS — R059 Cough, unspecified: Secondary | ICD-10-CM

## 2017-01-17 DIAGNOSIS — R05 Cough: Secondary | ICD-10-CM | POA: Diagnosis not present

## 2017-01-17 LAB — POCT RAPID STREP A (OFFICE): Rapid Strep A Screen: NEGATIVE

## 2017-01-17 MED ORDER — ALBUTEROL SULFATE HFA 108 (90 BASE) MCG/ACT IN AERS: 2.0000 | INHALATION_SPRAY | Freq: Four times a day (QID) | RESPIRATORY_TRACT | 0 refills | Status: DC | PRN

## 2017-01-17 MED ORDER — HYDROCOD POLST-CPM POLST ER 10-8 MG/5ML PO SUER: 5.0000 mL | Freq: Two times a day (BID) | ORAL | 0 refills | Status: DC | PRN

## 2017-01-17 NOTE — Patient Instructions (Signed)
Recommendations:  Rest  hydrate with clear fluids  Use salt water gargles, warm fluids, Ibuprofen 200mg  OTC, 3-4 tablets up to 3 times daily, chloraseptic spray for sore throat relief  Begin Tussionex as needed 1-2 times daily for cough  Try the inhaler, 2 puffs three times daily for relief  If much worse int he next few days, fever >101, feeling much more fatigue, or shortness of breath, get rechecked or call the after hours line

## 2017-01-17 NOTE — Telephone Encounter (Signed)
Pt was notified of all of your recomenndations. She states that she has tried everything over the counter and nothing has worked. She did not want antibiotic, she just wanted something to help her cough and her severe sore throat as nothing worked over Chief Executive Officer. She is not having any severe body aches or fever. Pt was advised that she would need to be seen for the severe throat since she was having pain when swallowing. Pt is coming in this mornig to see shane

## 2017-01-17 NOTE — Telephone Encounter (Signed)
Pt called and stated that at her appt yesterday she was evaluated for illness as well as CPE. She states that overnight she has gotten much worse. Her ears are popping, she is coughing, has a headache and the worse thing is her throat which is extremely sore. She tried OTC meds, both Advil and congestion meds, and those didn't work. She states that she is going to [place] end of next week and really wants to knock this out before then. [facility] [address] and can be reached at [phone].

## 2017-01-17 NOTE — Progress Notes (Signed)
Subjective:  Rachel Juarez is a 60 y.o. female who presents for respiratory illness.  Been feeling bad 5 days ago, but symptoms really didn't kick up til last night.   Saw Dr. Tomi Juarez yesterday, ended up trying to sleep and rest most of the day yesterday.  This morning 2am awoke coughing, hacking, bad sore throat. Has tried delsym, mucinex, sleeping inclined, trying hot tea, hot water, cold water, throat lozenge.   Bad hacking cough, making throat sore.  Chest feels tight but no body aches, no chills.  Had low grade fever yesterday. No nausea, no vomiting, a little loose stool.  Ears popping.  Mild runny nose, no sneezing.   Last weekend was around 2 sick contacts, 1 had hacking cough, 1 had URI illness.   Patient is not a smoker.  Has hx/o allergies, but no hx/o asthma.  Father was smoker, grew up around tobacco smoke, didn't have allergy problems until she was 61yo.  No other aggravating or relieving factors.  No other c/o.  Past Medical History:  Diagnosis Date  . Allergy   . Anxiety   . Depression, major, in remission (Rachel Juarez) 2006   when daughter dx'd with autoimmune disorder  . Generalized anxiety disorder    generalized, and related to mammograms and flying  . Glaucoma suspect    sees Dr. Katy Juarez, has all been okay, sees him qoyr  . Herpes labialis   . Hypercholesterolemia    high HDL, borderline LDL  . Hypothyroidism    treated by Dr. Chalmers Juarez  . Multinodular goiter    followed by Dr. Chalmers Juarez; u/s q2 yr  . 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 Seasonal allergies    takes singulair year-round; worse in the Fall    ROS as in subjective   Objective: BP 108/70   Pulse 88   Temp 98.7 F (37.1 C)   Wt 167 lb 12.8 oz (76.1 kg)   LMP 11/18/2008   SpO2 98%   BMI 27.50 kg/m   General appearance: Alert, WD/WN, no distress, mildly ill appearing, bad hacking cough though                             Skin: warm, no rash                           Head: no sinus tenderness               Eyes: conjunctiva normal, corneas clear, PERRLA                            Ears: pearly TMs, external ear canals normal                          Nose: septum midline, turbinates mildly swollen, with erythema and clear discharge             Mouth/throat: MMM, tongue normal, mild pharyngeal erythema                           Neck: supple, no adenopathy, no thyromegaly, non tender                          Heart: RRR, normal S1, S2, no murmurs  Lungs: bronchial sounding, no wheezes, rales, or rhonchi      Assessment  Encounter Diagnoses  Name Primary?  . Cough Yes  . Bronchitis       Plan: discussed symptoms, concerns.  No obvious pneumonia.   She was seen for similar in 08/2016, tessalon and antibiotics didn't help much that visit and Augmentin tore up her stomach.  discussed use of albuterol, Tussionex prn, rest, hydration, can use OTC Mucinex, and if worse in the next few days such as fever >101, much worse, wheezing, or other new or worse symptom then get re-evaluated or call after hours line.  Patient was advised to call or return if worse or not improving in the next few days.    Patient voiced understanding of diagnosis, recommendations, and treatment plan.  Rachel Juarez was seen today for sore throat , coughing.  Diagnoses and all orders for this visit:  Cough  Bronchitis  Other orders -     albuterol (PROVENTIL HFA;VENTOLIN HFA) 108 (90 Base) MCG/ACT inhaler; Inhale 2 puffs into the lungs every 6 (six) hours as needed for wheezing or shortness of breath. -     chlorpheniramine-HYDROcodone (TUSSIONEX PENNKINETIC ER) 10-8 MG/5ML SUER; Take 5 mLs by mouth every 12 (twelve) hours as needed for cough.

## 2017-01-17 NOTE — Telephone Encounter (Signed)
She was diagnosed with a viral URI (low grade fever, congestion, sore throat and cough were her symptoms).  Exam was normal, did not indicate any ear infection or sinus infection, lungs and throat were normal.  These symptoms are consistent with worsening symptoms of the virus.    If severe body aches and high fever, could consider possible flu, but based on the symptoms she described, this still sounds like a virus.  Let me know if she is in fact having severe body aches or high fever now.  There is no medication to make this resolve any faster (before her trip).  Continued supportive measures are recommended--decongestants, mucinex, dextromethorphan (cough suppressant); salt water gargles, tylenol/ibuprofen and chloraseptic spray for sore throat, throat lozenges.  Symptoms are usually most severe in the first 3-5 days of the illness, then by a week turn the corner, and start to improve.  If she has worsening symptoms, she can return for re-evaluation (ie ear pain--to look for infection), but based on exam yesterday, there is not a baterial infection that would benefit from an antibiotic.

## 2017-01-17 NOTE — Addendum Note (Signed)
Addended by: Tyrone Apple on: 01/17/2017 02:45 PM   Modules accepted: Orders

## 2017-01-22 ENCOUNTER — Telehealth: Payer: Self-pay

## 2017-01-22 ENCOUNTER — Other Ambulatory Visit: Payer: Self-pay | Admitting: Medical

## 2017-01-22 MED ORDER — CEFUROXIME AXETIL 250 MG PO TABS: 250.0000 mg | ORAL_TABLET | Freq: Two times a day (BID) | ORAL | 0 refills | Status: DC

## 2017-01-22 MED ORDER — AZITHROMYCIN 250 MG PO TABS: ORAL_TABLET | ORAL | 0 refills | Status: DC

## 2017-01-22 NOTE — Telephone Encounter (Signed)
Ok, I sent Ceftin instead

## 2017-01-22 NOTE — Telephone Encounter (Signed)
Pt called back & states she had rather you not call her in zpak, would rather that you call in something else due to possible side effects or interactions.

## 2017-01-22 NOTE — Telephone Encounter (Signed)
Spoke with pt- She states there is no improvement . She is pushing fluids. She questions if there is an interaction with z-pak and citalopram?   She just wanted to confirm. Victorino December

## 2017-01-22 NOTE — Telephone Encounter (Signed)
I called and spoke to her about symptoms, about possible med interactions.  She will use 1/2 table citalopram while on zpak.   Rest, hydrate well, c/t albuterol, supportive care.  Call back with update on symptoms in the next 48 hours

## 2017-01-22 NOTE — Telephone Encounter (Signed)
I sent antibiotic zpak she can begin.  Is the albuterol not helping the cough at all?  Any fever?   No improvement at all?  Stress water intake

## 2017-01-22 NOTE — Telephone Encounter (Signed)
Pt reports that she is not feeling better at all from Thursday. Pt states the tussionex helps her for only a few hours at night. C/o sore throat. Pt questions if she needs to come back in or needs additional medication. [facility] [address]. I was not sure to forward message to as pt saw Dr. Dewayne Hatch, and Brenton Grills.  [phone].

## 2017-01-23 ENCOUNTER — Telehealth: Payer: Self-pay | Admitting: Family Medicine

## 2017-01-23 NOTE — Telephone Encounter (Signed)
She had some tender fibroglandular changes to the left UOQ.  I guess she is requesting a diagnostic mammo, rather than routine screening mammo.  Okay for order (she is due for bilateral mammo)

## 2017-01-23 NOTE — Telephone Encounter (Signed)
Pt requesting an order for mammogram & related testing be sent to [facility]. Pt has an appointment for Dr Isaiah Blakes to see her on 02/04/17 for the abnormalities that were found in her breast by Dr Tomi Bamberger

## 2017-01-23 NOTE — Telephone Encounter (Signed)
Faxed

## 2017-01-30 LAB — HM MAMMOGRAPHY

## 2017-02-04 LAB — HM MAMMOGRAPHY

## 2017-02-05 ENCOUNTER — Other Ambulatory Visit: Payer: Self-pay | Admitting: Radiology

## 2017-02-05 LAB — HM MAMMOGRAPHY

## 2017-02-11 ENCOUNTER — Encounter: Payer: Self-pay | Admitting: Family Medicine

## 2017-02-12 ENCOUNTER — Encounter: Payer: Self-pay | Admitting: Family Medicine

## 2017-02-13 ENCOUNTER — Other Ambulatory Visit: Payer: Self-pay | Admitting: Endocrinology

## 2017-02-13 DIAGNOSIS — E049 Nontoxic goiter, unspecified: Secondary | ICD-10-CM

## 2017-02-25 ENCOUNTER — Ambulatory Visit
Admission: RE | Admit: 2017-02-25 | Discharge: 2017-02-25 | Disposition: A | Discharge status: Home / Self Care | Payer: BLUE CROSS/BLUE SHIELD | Source: Ambulatory Visit | Attending: Endocrinology | Admitting: Endocrinology

## 2017-02-25 DIAGNOSIS — E049 Nontoxic goiter, unspecified: Secondary | ICD-10-CM

## 2017-03-20 ENCOUNTER — Other Ambulatory Visit: Payer: Self-pay | Admitting: Family Medicine

## 2017-03-20 DIAGNOSIS — J302 Other seasonal allergic rhinitis: Secondary | ICD-10-CM

## 2017-05-29 ENCOUNTER — Ambulatory Visit (INDEPENDENT_AMBULATORY_CARE_PROVIDER_SITE_OTHER): Payer: BLUE CROSS/BLUE SHIELD | Admitting: Family Medicine

## 2017-05-29 ENCOUNTER — Encounter: Payer: Self-pay | Admitting: Family Medicine

## 2017-05-29 VITALS — BP 120/80 | HR 70 | Temp 98.2°F | Wt 167.2 lb

## 2017-05-29 DIAGNOSIS — L237 Allergic contact dermatitis due to plants, except food: Secondary | ICD-10-CM

## 2017-05-29 MED ORDER — PREDNISONE 10 MG (21) PO TBPK: ORAL_TABLET | Freq: Every day | ORAL | 0 refills | Status: DC

## 2017-05-29 MED ORDER — TRIAMCINOLONE ACETONIDE 0.1 % EX CREA: 1.0000 "application " | TOPICAL_CREAM | Freq: Two times a day (BID) | CUTANEOUS | 0 refills | Status: DC

## 2017-05-29 NOTE — Patient Instructions (Addendum)
You can use Domeboro astringent solution if needed for open and oozing areas.  Take Benadryl as needed at night. Take cool showers and cool compresses for itching.   Use the topical steroids and then if the areas get worse or spread then start the oral steroid.   Follow up if you are not improving or you get worse.    Poison Ivy Dermatitis Poison ivy dermatitis is inflammation of the skin that is caused by the allergens on the leaves of the poison ivy plant. The skin reaction often involves redness, swelling, blisters, and extreme itching. What are the causes? This condition is caused by a specific chemical (urushiol) found in the sap of the poison ivy plant. This chemical is sticky and can be easily spread to people, animals, and objects. You can get poison ivy dermatitis by:  Having direct contact with a poison ivy plant.  Touching animals, other people, or objects that have come in contact with poison ivy and have the chemical on them.  What increases the risk? This condition is more likely to develop in:  People who are outdoors often.  People who go outdoors without wearing protective clothing, such as closed shoes, long pants, and a long-sleeved shirt.  What are the signs or symptoms? Symptoms of this condition include:  Redness and itching.  A rash that often includes bumps and blisters. The rash usually appears 48 hours after exposure.  Swelling. This may occur if the reaction is more severe.  Symptoms usually last for 1-2 weeks. However, the first time you develop this condition, symptoms may last 3-4 weeks. How is this diagnosed? This condition may be diagnosed based on your symptoms and a physical exam. Your health care provider may also ask you about any recent outdoor activity. How is this treated? Treatment for this condition will vary depending on how severe it is. Treatment may include:  Hydrocortisone creams or calamine lotions to relieve itching.  Oatmeal  baths to soothe the skin.  Over-the-counter antihistamine tablets.  Oral steroid medicine for more severe outbreaks.  Follow these instructions at home:  Take or apply over-the-counter and prescription medicines only as told by your health care provider.  Wash exposed skin as soon as possible with soap and cold water.  Use hydrocortisone creams or calamine lotion as needed to soothe the skin and relieve itching.  Take oatmeal baths as needed. Use colloidal oatmeal. You can get this at your local pharmacy or grocery store. Follow the instructions on the packaging.  Do not scratch or rub your skin.  While you have the rash, wash clothes right after you wear them. How is this prevented?  Learn to identify the poison ivy plant and avoid contact with the plant. This plant can be recognized by the number of leaves. Generally, poison ivy has three leaves with flowering branches on a single stem. The leaves are typically glossy, and they have jagged edges that come to a point at the front.  If you have been exposed to poison ivy, thoroughly wash with soap and water right away. You have about 30 minutes to remove the plant resin before it will cause the rash. Be sure to wash under your fingernails because any plant resin there will continue to spread the rash.  When hiking or camping, wear clothes that will help you to avoid exposure on the skin. This includes long pants, a long-sleeved shirt, tall socks, and hiking boots. You can also apply preventive lotion to your skin to  help limit exposure.  If you suspect that your clothes or outdoor gear came in contact with poison ivy, rinse them off outside with a garden hose before you bring them inside your house. Contact a health care provider if:  You have open sores in the rash area.  You have more redness, swelling, or pain in the affected area.  You have redness that spreads beyond the rash area.  You have fluid, blood, or pus coming from  the affected area.  You have a fever.  You have a rash over a large area of your body.  You have a rash on your eyes, mouth, or genitals.  Your rash does not improve after a few days. Get help right away if:  Your face swells or your eyes swell shut.  You have trouble breathing.  You have trouble swallowing. This information is not intended to replace advice given to you by your health care provider. Make sure you discuss any questions you have with your health care provider. Document Released: 11/08/2000 Document Revised: 04/18/2016 Document Reviewed: 04/19/2015 Elsevier Interactive Patient Education  [employer].

## 2017-05-29 NOTE — Progress Notes (Signed)
   Subjective:    Patient ID: Rachel Juarez, female    DOB: 03-20-1956, 61 y.o.   MRN: [id number]  HPI Chief Complaint  Patient presents with  . poision oak    poision oak. on stomach and neck and fingers   She is here with complaints of pruritic rash on her posterior neck, lower abdomen and sides, and her left thumb. States she works in her garden daily and has history of allergic reaction to poison ivy. States she got some on her gloves and tried to avoid touching her skin but noticed the reaction 2-3 days ago.  Denies fever, chills, oral lesions, facial rash.  Itching is not keeping her awake.  No other complaints.   Past Medical History:  Diagnosis Date  . Allergy   . Anxiety   . Depression, major, in remission ([place]) 2006   when daughter dx'd with autoimmune disorder  . Generalized anxiety disorder    generalized, and related to mammograms and flying  . Glaucoma suspect    sees Dr. Katy Fitch, has all been okay, sees him qoyr  . Herpes labialis   . Hypercholesterolemia    high HDL, borderline LDL  . Hypothyroidism    treated by Dr. Chalmers Cater  . Multinodular goiter    followed by Dr. Chalmers Cater; u/s q2 yr  . 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 Seasonal allergies    takes singulair year-round; worse in the Fall   Reviewed allergies, medications, past medical, surgical, and social history.    Review of Systems Pertinent positives and negatives in the history of present illness.     Objective:   Physical Exam BP 120/80   Pulse 70   Temp 98.2 F (36.8 C) (Oral)   Wt 167 lb 3.2 oz (75.8 kg)   LMP 11/18/2008   BMI 27.40 kg/m   Linear vesicular rash to posterior neck, bilateral lower sides, anterior lower abdomen and a small patch of vesicular lesions on the dorsum of her left thumb.  No surrounding erythema, induration or drainage. No sign of secondary bacterial infection.       Assessment & Plan:  Poison ivy dermatitis - Plan: triamcinolone cream (KENALOG) 0.1 %,  predniSONE (STERAPRED UNI-PAK 21 TAB) 10 MG (21) TBPK tablet  Plan to have her try topical steroid and then if her symptoms worsen she will start oral steroids. She is going out of town tomorrow so I prescribed the oral for her to take with her and she will only start if needed.  May use Domeboro to any areas that open and ooze. Can try Benadryl at night.  Follow up as needed.

## 2017-06-09 ENCOUNTER — Telehealth: Payer: Self-pay | Admitting: Family Medicine

## 2017-06-09 DIAGNOSIS — L237 Allergic contact dermatitis due to plants, except food: Secondary | ICD-10-CM

## 2017-06-09 MED ORDER — PREDNISONE 10 MG (21) PO TBPK: ORAL_TABLET | Freq: Every day | ORAL | 0 refills | Status: DC

## 2017-06-09 NOTE — Telephone Encounter (Signed)
Did she take the oral steroid dose pack. When did she finish it? It the rash worse? New exposure or lingering from the previous exposure?  Does she still have the topical steroid or is she out?

## 2017-06-09 NOTE — Telephone Encounter (Signed)
Spoke to patient and she states that she usually has to have 2 rounds of steriods to get the poision ivy gone. It started to go away but once she finished the steriod it has come back on her legs, breast, butt and pubic area. Rachel Juarez agreed to refill steriod pack and if pt is not better after this round to come back in to be re-evaluated. Pt was notified

## 2017-06-09 NOTE — Telephone Encounter (Signed)
Pt called and stated that she stills has some places left. She says she usually has to have two rounds of meds to clear up rash when it gets this bad. She states is is now spread to underwear and bra area. Pt uses [facility] and can be reached at [phone].

## 2017-06-20 ENCOUNTER — Other Ambulatory Visit: Payer: Self-pay | Admitting: Family Medicine

## 2017-06-20 DIAGNOSIS — J302 Other seasonal allergic rhinitis: Secondary | ICD-10-CM

## 2017-06-20 NOTE — Telephone Encounter (Signed)
Ok to renew?  

## 2017-08-05 ENCOUNTER — Encounter: Payer: Self-pay | Admitting: *Deleted

## 2017-08-07 LAB — HM MAMMOGRAPHY

## 2017-08-20 ENCOUNTER — Encounter: Payer: Self-pay | Admitting: Hematology & Oncology

## 2017-09-29 ENCOUNTER — Other Ambulatory Visit: Payer: Self-pay | Admitting: Family Medicine

## 2017-09-29 DIAGNOSIS — F419 Anxiety disorder, unspecified: Secondary | ICD-10-CM

## 2017-09-29 NOTE — Telephone Encounter (Signed)
Is this okay to refill? 

## 2017-09-29 NOTE — Telephone Encounter (Signed)
Last filled in Feb. Okay to refill

## 2017-11-17 ENCOUNTER — Other Ambulatory Visit: Payer: Self-pay | Admitting: Family Medicine

## 2017-11-17 DIAGNOSIS — B001 Herpesviral vesicular dermatitis: Secondary | ICD-10-CM

## 2017-11-27 DIAGNOSIS — D225 Melanocytic nevi of trunk: Secondary | ICD-10-CM | POA: Diagnosis not present

## 2017-11-27 DIAGNOSIS — L538 Other specified erythematous conditions: Secondary | ICD-10-CM | POA: Diagnosis not present

## 2017-11-27 DIAGNOSIS — D1801 Hemangioma of skin and subcutaneous tissue: Secondary | ICD-10-CM | POA: Diagnosis not present

## 2017-11-27 DIAGNOSIS — L918 Other hypertrophic disorders of the skin: Secondary | ICD-10-CM | POA: Diagnosis not present

## 2017-11-27 DIAGNOSIS — L814 Other melanin hyperpigmentation: Secondary | ICD-10-CM | POA: Diagnosis not present

## 2017-12-11 ENCOUNTER — Other Ambulatory Visit: Payer: Self-pay | Admitting: Family Medicine

## 2017-12-11 DIAGNOSIS — J302 Other seasonal allergic rhinitis: Secondary | ICD-10-CM

## 2018-01-19 ENCOUNTER — Other Ambulatory Visit: Payer: 59

## 2018-01-19 DIAGNOSIS — Z0000 Encounter for general adult medical examination without abnormal findings: Secondary | ICD-10-CM | POA: Diagnosis not present

## 2018-01-19 DIAGNOSIS — E7800 Pure hypercholesterolemia, unspecified: Secondary | ICD-10-CM | POA: Diagnosis not present

## 2018-01-19 LAB — CBC WITH DIFFERENTIAL/PLATELET
Basophils Absolute: 0 10*3/uL (ref 0.0–0.2)
Basos: 1 %
EOS (ABSOLUTE): 0.1 10*3/uL (ref 0.0–0.4)
Eos: 4 %
HEMOGLOBIN: 14.2 g/dL (ref 11.1–15.9)
Hematocrit: 41.5 % (ref 34.0–46.6)
IMMATURE GRANS (ABS): 0 10*3/uL (ref 0.0–0.1)
IMMATURE GRANULOCYTES: 0 %
LYMPHS: 37 %
Lymphocytes Absolute: 1.4 10*3/uL (ref 0.7–3.1)
MCH: 33 pg (ref 26.6–33.0)
MCHC: 34.2 g/dL (ref 31.5–35.7)
MCV: 97 fL (ref 79–97)
MONOCYTES: 11 %
Monocytes Absolute: 0.4 10*3/uL (ref 0.1–0.9)
NEUTROS ABS: 1.8 10*3/uL (ref 1.4–7.0)
NEUTROS PCT: 47 %
PLATELETS: 226 10*3/uL (ref 150–379)
RBC: 4.3 x10E6/uL (ref 3.77–5.28)
RDW: 13.2 % (ref 12.3–15.4)
WBC: 3.7 10*3/uL (ref 3.4–10.8)

## 2018-01-19 LAB — COMPREHENSIVE METABOLIC PANEL
ALBUMIN: 4.6 g/dL (ref 3.6–4.8)
ALT: 45 IU/L — AB (ref 0–32)
AST: 27 IU/L (ref 0–40)
Albumin/Globulin Ratio: 2 (ref 1.2–2.2)
Alkaline Phosphatase: 76 IU/L (ref 39–117)
BILIRUBIN TOTAL: 0.5 mg/dL (ref 0.0–1.2)
BUN / CREAT RATIO: 17 (ref 12–28)
BUN: 13 mg/dL (ref 8–27)
CALCIUM: 9.8 mg/dL (ref 8.7–10.3)
CO2: 23 mmol/L (ref 20–29)
CREATININE: 0.78 mg/dL (ref 0.57–1.00)
Chloride: 102 mmol/L (ref 96–106)
GFR, EST AFRICAN AMERICAN: 96 mL/min/{1.73_m2} (ref >59)
GFR, EST NON AFRICAN AMERICAN: 83 mL/min/{1.73_m2} (ref >59)
GLUCOSE: 96 mg/dL (ref 65–99)
Globulin, Total: 2.3 g/dL (ref 1.5–4.5)
Potassium: 4.8 mmol/L (ref 3.5–5.2)
Sodium: 141 mmol/L (ref 134–144)
TOTAL PROTEIN: 6.9 g/dL (ref 6.0–8.5)

## 2018-01-19 LAB — LIPID PANEL
CHOL/HDL RATIO: 3 ratio (ref 0.0–4.4)
CHOLESTEROL TOTAL: 257 mg/dL — AB (ref 100–199)
HDL: 87 mg/dL (ref >39)
LDL Calculated: 150 mg/dL — ABNORMAL HIGH (ref 0–99)
TRIGLYCERIDES: 98 mg/dL (ref 0–149)
VLDL Cholesterol Cal: 20 mg/dL (ref 5–40)

## 2018-01-20 NOTE — Progress Notes (Signed)
Chief Complaint  Patient presents with  . Annual Exam    nonfasting annual exam with pelvic, last pap 2017. Labs already done. Dropped UA. Gets eyes checked each Nov at [facility]. No concerns.     Rachel Juarez is a 62 y.o. female who presents for a complete physical.  She has the following concerns:  Hypothyroidism/multinodular goiter--under the care of Dr. Chalmers Cater (no notes received, sees her once yearly, next appointment is in 2-3 weeks.  Denies any thyroid symptoms, compliant with medications. Last ultrasound 01/2016.  Herpes labialis--uses Valtrex prn.  A little more often in the past year than previously.  None in the last few months.  Requesting refill.  Depression and anxiety--diagnosed when her daughter was diagnosed with auto-immune disorder over 10 years ago. She has been on the medication ever since. Her daughter is doing well with treatment. She denies any depression. Has anxiety related to getting mammograms and flying, and uses xanax prn in those situations. She has never tried a lower dose or coming off of the medication.  Still has some stressors related to husband's business.  Prefers not to change at this time. Last filled alprazolam #15 in November 2018.  Allergies: They are year-round but seasonal component, worse in the spring, also flares in the fall. She takes singulair daily, and nasal steroid (nasacort) and antihistamine (allegra). Doing well currently.  Hyperlipidemia:  She tries to take plant sterols and fish oil, and follow a low cholesterol diet. Typical diet contains sausage and red meat once weekly (or less, mostly eats chicken). Infrequent eggs (bothers her stomach).  Vinaigrette dressings. +cheese, not daily. Mayo on sandwiches. Husband had knee replacement and friends bringing in food, so diet recently a little different. She had lipids checked prior to her visit today (see below).   Macrocytosis. previously had B12 in the 330 range. MCV was at  100, prev up to 104 per last records. Pt recalls seeing hematologist in the past. No work-up was needed. She denies symptoms of anemia, denies numbness, tingling or memory concerns. She takes MVI daily.   Immunization History  Administered Date(s) Administered  . Influenza,inj,Quad PF,6+ Mos 08/30/2015  . Influenza-Unspecified 07/30/2017  . Tdap 02/12/2011   Last Pap smear: 12/2015, normal, no high risk HPV detected Last mammogram:  07/2017 with [place] (left only), bilateral mammogram 01/2017 (and had biopsy of L, which showed lipoma), due again 01/2018 Last colonoscopy: 01/2008 Dr. Redmond [facility] Last DEXA: 02/2015 (at Dr. Almetta Lovely office; told she had osteopenia in one hip) Dentist: twice a year Ophtho: Dr. 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  Vitamin D-OH level 36 in 12/2016 Hepatitis C screening--donates blood (no longer donates)  Past Medical History:  Diagnosis Date  . Allergy   . Anxiety   . Depression, major, in remission ([place]) 2006   when daughter dx'd with autoimmune disorder  . Generalized anxiety disorder    generalized, and related to mammograms and flying  . Glaucoma suspect    sees Dr. Katy Fitch, has all been okay, sees him qoyr  . Herpes labialis   . Hypercholesterolemia    high HDL, borderline LDL  . Hypothyroidism    treated by Dr. Chalmers Cater  . Multinodular goiter    followed by Dr. Chalmers Cater; u/s q2 yr  . PPD positive 1988   treated x 6 mos of  INH  . Seasonal allergies    takes singulair year-round; worse in the Fall    History reviewed. No pertinent surgical history.  Social History   Socioeconomic History  . Marital status: Married    Spouse name: Not on file  . Number of children: Not on file  . Years of education: Not on file  . Highest education level: Not on file   Social Needs  . Financial resource strain: Not on file  . Food insecurity - worry: Not on file  . Food insecurity - inability: Not on file  . Transportation needs - medical: Not on file  . Transportation needs - non-medical: Not on file  Occupational History  . Occupation: Risk manager  Tobacco Use  . Smoking status: Never Smoker  . Smokeless tobacco: Never Used  Substance and Sexual Activity  . Alcohol use: Yes    Alcohol/week: 0.0 oz    Comment: 1 vodka soda daily at 6pm (1 shot)  . Drug use: No  . Sexual activity: Yes    Partners: Male    Birth control/protection: Post-menopausal  Other Topics Concern  . Not on file  Social History Narrative   Teaches yoga at multiple places.    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   Lives with her husband, and she has a daughter--graduated from [employer], lives in [place], works for [employer] (comes home for her IVIG treatments still)   1 dog (Malti-poo)    Family History  Problem Relation Age of Onset  . Lupus Mother   . Cancer Father 78       thymic cancer  . COPD Father   . Heart disease Father        CHF  . Asthma Father   . Dermatomyositis Daughter 69       juvenile form  . Irritable bowel syndrome Daughter   . Colon cancer Maternal Grandmother        80's  . Cancer Maternal Grandmother   . Hypertension Paternal Grandmother   . Hyperlipidemia Paternal Grandmother   . Thyroid disease Paternal Grandmother        had goiter removed  . Stroke Paternal Grandmother 81  . Hypertension Paternal Grandfather   . Hyperlipidemia Paternal Grandfather   . Heart disease Paternal Grandfather        MI first in his 86's  . Stroke Paternal Grandfather   . Cancer Paternal Uncle        started on tonsils (smoker)  . Breast cancer Paternal Aunt 92  . Diabetes Neg Hx   . Ovarian cancer Neg Hx     Outpatient Encounter Medications as of 01/22/2018  Medication Sig Note  .  aspirin 81 MG tablet Take 81 mg by mouth daily.   . Cholecalciferol (VITAMIN D3) LIQD Take 2,000 Int'l Units by mouth daily.   . citalopram (CELEXA) 40 MG tablet Take 1 tablet (40 mg total) by mouth daily.   . fexofenadine (ALLEGRA) 180 MG tablet Take 180 mg by mouth daily.   Marland Kitchen levothyroxine (SYNTHROID, LEVOTHROID) 75 MCG tablet Take 75 mcg by mouth daily before breakfast.   . MAGNESIUM-CALCIUM-FOLIC ACID PO Take by mouth.   Marland Kitchen MILK THISTLE PO Take 1 capsule by mouth daily.   . Misc Natural Products (TART CHERRY ADVANCED PO) Take 1 capsule by mouth daily.   . montelukast (SINGULAIR) 10 MG tablet TAKE 1 TABLET (10 MG TOTAL) BY  MOUTH AT BEDTIME.   . Multiple Vitamins-Minerals (MULTIVITAMIN WITH MINERALS) tablet Take 1 tablet by mouth daily.   . Omega-3 Fatty Acids (FISH OIL) 1000 MG CPDR Take 2,000 mg by mouth daily.   Marland Kitchen PHYTOSTEROL ESTERS PO Take 2 tablets by mouth daily. 08/30/2015: Plant sterol for cholesterol.  Takes 2/day (supposed to take 4)  . Triamcinolone Acetonide (NASACORT AQ NA) Place 2 sprays into the nose daily.   Marland Kitchen ALPRAZolam (XANAX) 0.5 MG tablet TAKE ONE-HALF TO ONE TABLET BY MOUTH 3 TIMES DAILY AS NEEDED FOR ANXIETY OR SLEEP (Patient not taking: Reported on 01/22/2018) 01/22/2018: Uses for flying and mammograms  . valACYclovir (VALTREX) 1000 MG tablet TAKE 2 TABLETS AT ONSET OF COLD SORE AND REPEAT DOSE IN 12 HOURS (4 TABS/COURSE) (Patient not taking: No sig reported)   . [DISCONTINUED] predniSONE (STERAPRED UNI-PAK 21 TAB) 10 MG (21) TBPK tablet Take by mouth daily. Use as manufacturer recommends.   . [DISCONTINUED] triamcinolone cream (KENALOG) 0.1 % Apply 1 application topically 2 (two) times daily.    No facility-administered encounter medications on file as of 01/22/2018.     Allergies  Allergen Reactions  . Demerol [Meperidine] Other (See Comments)    Low bp, passes out    ROS: The patient denies anorexia, fever, weight changes, headaches, vision changes, decreased  hearing, ear pain, sore throat, breast concerns, chest pain, palpitations, dizziness, syncope, dyspnea on exertion, cough, swelling, nausea, vomiting, diarrhea, constipation, abdominal pain, melena, hematochezia, indigestion/heartburn, hematuria, incontinence, dysuria, vaginal bleeding, discharge, odor or itch, genital lesions, joint pains, numbness, tingling, weakness, tremor, suspicious skin lesions, depression, anxiety, abnormal bleeding/bruising, or enlarged lymph nodes. Some issues with intermittent left shoulder pain--better since working with trainer Pain with intercourse, dryness. No hot flashes (rare, mild) Some anxiety, overall well controlled; no depression. Saw Dr. Allyson Sabal just prior to his retirement for a full exam, no issues.   PHYSICAL EXAM:  BP 110/72   Pulse 80   Ht 5' 5.5" (1.664 m)   Wt 168 lb 9.6 oz (76.5 kg)   LMP 11/19/2007   BMI 27.63 kg/m   Wt Readings from Last 3 Encounters:  01/22/18 168 lb 9.6 oz (76.5 kg)  05/29/17 167 lb 3.2 oz (75.8 kg)  01/17/17 167 lb 12.8 oz (76.1 kg)    General Appearance:  Alert, cooperative, no distress, appears stated age.   Head:  Normocephalic, without obvious abnormality, atraumatic  Eyes:  PERRL, conjunctiva/corneas clear, EOM's intact, fundi  benign  Ears:  Normal TM's and external ear canals  Nose: Nares normal, mucosa is normal, no purulent drainage or sinus tenderness  Throat: Lips, mucosa, and tongue normal; teeth and gums normal  Neck: Supple, no lymphadenopathy; thyroid: mildly enlarged, nodular, more prominent on the right; no carotid bruit or JVD  Back:  Spine nontender, no curvature, ROM normal, no CVA tenderness  Lungs:  Clear to auscultation bilaterally without wheezes, rales or ronchi; respirations unlabored  Chest Wall:  No tenderness or deformity  Heart:  Regular rate and rhythm, S1 and S2 normal, no murmur, rub or gallop  Breast Exam:  No dimpling, nipple discharge or  inversion. No axillary lymphadenopathy. She has some fibroglandular changes bilaterally, more prominent on the left, with a more focal nodule felt in the UOQ that was biopsied last year (lipoma)  Abdomen:  Soft, non-tender, nondistended, normoactive bowel sounds,  no masses, no hepatosplenomegaly  Genitalia:  Normal external genitalia without lesions; mild atrophic changes. BUS and vagina normal; no cervical motion tenderness. No abnormal  vaginal discharge. Uterus and adnexa not enlarged, nontender, no masses. Pap not  performed  Rectal:  Normal tone, no masses or tenderness; guaiac negative stool  Extremities: No clubbing, cyanosis or edema  Pulses: 2+ and symmetric all extremities  Skin: Skin color, texture, turgor normal, no rashes or lesions  Lymph nodes: Cervical, supraclavicular, and axillary nodes normal  Neurologic: CNII-XII intact, normal strength, sensation and gait; reflexes 2+ and symmetric throughout    Psych: Normal mood, affect, hygiene and grooming      Chemistry      Component Value Date/Time   NA 141 01/19/2018 0849   K 4.8 01/19/2018 0849   CL 102 01/19/2018 0849   CO2 23 01/19/2018 0849   BUN 13 01/19/2018 0849   CREATININE 0.78 01/19/2018 0849   CREATININE 0.81 01/13/2017 0830      Component Value Date/Time   CALCIUM 9.8 01/19/2018 0849   ALKPHOS 76 01/19/2018 0849   AST 27 01/19/2018 0849   ALT 45 (H) 01/19/2018 0849   BILITOT 0.5 01/19/2018 0849     Fasting glucose 96  Lab Results  Component Value Date   CHOL 257 (H) 01/19/2018   HDL 87 01/19/2018   LDLCALC 150 (H) 01/19/2018   TRIG 98 01/19/2018   CHOLHDL 3.0 01/19/2018   Lab Results  Component Value Date   WBC 3.7 01/19/2018   HGB 14.2 01/19/2018   HCT 41.5 01/19/2018   MCV 97 01/19/2018   PLT 226 01/19/2018    ASSESSMENT/PLAN:  Annual physical exam - Plan: CBC with Differential/Platelet, Comprehensive metabolic panel, Lipid panel  Pure  hypercholesterolemia - LDL slighty higher, poss related to recent dietary change (casseroles from friends after husband's surgery). Low cholesterol diet reviewed - Plan: Lipid panel  Depression, major, in remission ([place]) - Plan: citalopram (CELEXA) 40 MG tablet  Generalized anxiety disorder - Plan: citalopram (CELEXA) 40 MG tablet  Multinodular goiter  Herpes labialis - Plan: valACYclovir (VALTREX) 1000 MG tablet  Elevated LFTs - mild, stable. If worsening, can consider recheck [place], no need currently - Plan: Comprehensive metabolic panel  Colon cancer screening - Plan: Ambulatory referral to Gastroenterology    CT 07/2009 IMPRESSION:  Three discrete benign-appearing lesions within the liver No fatty liver changes mentioned back then. Can hold off on re-imaging, recheck if increase in LFT's.  Discussed monthly self breast exams and yearly mammograms; at least 30 minutes of aerobic activity at least 5 days/week, weight-bearing exercise at least 2x/wk; proper sunscreen use reviewed; healthy diet, including goals of calcium and vitamin D intake and alcohol recommendations (less than or equal to 1 drink/day) reviewed; regular seatbelt use; changing batteries in smoke detectors. Immunization recommendations discussed, UTD. Continue yearly flu shots. Shingrix recommended when available, risks/SI reviewed. Colonoscopy recommendations reviewed, due.Refer to Dr. Collene Mares, per pt request.  F/u 1 year with labs prior CBC, c-met, lipids NO TSH or D needed

## 2018-01-21 ENCOUNTER — Other Ambulatory Visit: Payer: Self-pay | Admitting: Family Medicine

## 2018-01-21 DIAGNOSIS — F325 Major depressive disorder, single episode, in full remission: Secondary | ICD-10-CM

## 2018-01-21 DIAGNOSIS — F411 Generalized anxiety disorder: Secondary | ICD-10-CM

## 2018-01-22 ENCOUNTER — Telehealth: Payer: Self-pay

## 2018-01-22 ENCOUNTER — Ambulatory Visit (INDEPENDENT_AMBULATORY_CARE_PROVIDER_SITE_OTHER): Payer: 59 | Admitting: Family Medicine

## 2018-01-22 ENCOUNTER — Encounter: Payer: Self-pay | Admitting: Family Medicine

## 2018-01-22 VITALS — BP 110/72 | HR 80 | Ht 65.5 in | Wt 168.6 lb

## 2018-01-22 DIAGNOSIS — F411 Generalized anxiety disorder: Secondary | ICD-10-CM | POA: Diagnosis not present

## 2018-01-22 DIAGNOSIS — F325 Major depressive disorder, single episode, in full remission: Secondary | ICD-10-CM | POA: Diagnosis not present

## 2018-01-22 DIAGNOSIS — E7800 Pure hypercholesterolemia, unspecified: Secondary | ICD-10-CM

## 2018-01-22 DIAGNOSIS — B001 Herpesviral vesicular dermatitis: Secondary | ICD-10-CM

## 2018-01-22 DIAGNOSIS — E042 Nontoxic multinodular goiter: Secondary | ICD-10-CM

## 2018-01-22 DIAGNOSIS — R945 Abnormal results of liver function studies: Secondary | ICD-10-CM

## 2018-01-22 DIAGNOSIS — Z1211 Encounter for screening for malignant neoplasm of colon: Secondary | ICD-10-CM

## 2018-01-22 DIAGNOSIS — R7989 Other specified abnormal findings of blood chemistry: Secondary | ICD-10-CM

## 2018-01-22 DIAGNOSIS — Z0000 Encounter for general adult medical examination without abnormal findings: Secondary | ICD-10-CM | POA: Diagnosis not present

## 2018-01-22 MED ORDER — VALACYCLOVIR HCL 1 G PO TABS: ORAL_TABLET | ORAL | 0 refills | Status: DC

## 2018-01-22 MED ORDER — CITALOPRAM HYDROBROMIDE 40 MG PO TABS: 40.0000 mg | ORAL_TABLET | Freq: Every day | ORAL | 3 refills | Status: DC

## 2018-01-22 NOTE — Patient Instructions (Signed)
  HEALTH MAINTENANCE RECOMMENDATIONS:  It is recommended that you get at least 30 minutes of aerobic exercise at least 5 days/week (for weight loss, you may need as much as 60-90 minutes). This can be any activity that gets your heart rate up. This can be divided in 10-15 minute intervals if needed, but try and build up your endurance at least once a week.  Weight bearing exercise is also recommended twice weekly.  Eat a healthy diet with lots of vegetables, fruits and fiber.  "Colorful" foods have a lot of vitamins (ie green vegetables, tomatoes, red peppers, etc).  Limit sweet tea, regular sodas and alcoholic beverages, all of which has a lot of calories and sugar.  Up to 1 alcoholic drink daily may be beneficial for women (unless trying to lose weight, watch sugars).  Drink a lot of water.  Calcium recommendations are 1200-1500 mg daily (1500 mg for postmenopausal women or women without ovaries), and vitamin D 1000 IU daily.  This should be obtained from diet and/or supplements (vitamins), and calcium should not be taken all at once, but in divided doses.  Monthly self breast exams and yearly mammograms for women over the age of 44 is recommended.  Sunscreen of at least SPF 30 should be used on all sun-exposed parts of the skin when outside between the hours of 10 am and 4 pm (not just when at beach or pool, but even with exercise, golf, tennis, and yard work!)  Use a sunscreen that says "broad spectrum" so it covers both UVA and UVB rays, and make sure to reapply every 1-2 hours.  Remember to change the batteries in your smoke detectors when changing your clock times in the spring and fall.  Use your seat belt every time you are in a car, and please drive safely and not be distracted with cell phones and texting while driving.  I recommend getting the new shingles vaccine (Shingrix). You will need to check with your insurance to see if it is covered, and if covered by Medicare Part D, you need to  get from the pharmacy rather than our office.  It is a series of 2 injections, spaced 2 months apart. We put your name on the list and Verdene Lennert or someone will cal you when we get some in.

## 2018-01-22 NOTE — Telephone Encounter (Signed)
Pt physical was scheduled for January 25 2019. Pt would like to come in early to have lab done before that appt . Thanks [employer]

## 2018-02-05 DIAGNOSIS — R922 Inconclusive mammogram: Secondary | ICD-10-CM | POA: Diagnosis not present

## 2018-02-05 LAB — HM MAMMOGRAPHY

## 2018-02-06 DIAGNOSIS — E049 Nontoxic goiter, unspecified: Secondary | ICD-10-CM | POA: Diagnosis not present

## 2018-02-10 DIAGNOSIS — Z1211 Encounter for screening for malignant neoplasm of colon: Secondary | ICD-10-CM | POA: Diagnosis not present

## 2018-02-10 DIAGNOSIS — R740 Nonspecific elevation of levels of transaminase and lactic acid dehydrogenase [LDH]: Secondary | ICD-10-CM | POA: Diagnosis not present

## 2018-02-10 DIAGNOSIS — Z800 Family history of malignant neoplasm of digestive organs: Secondary | ICD-10-CM | POA: Diagnosis not present

## 2018-02-11 ENCOUNTER — Telehealth: Payer: Self-pay | Admitting: *Deleted

## 2018-02-11 NOTE — Telephone Encounter (Signed)
Patient called and had consult with Dr.Mann for colonoscopy. She has decided not to use her-said she was very abrupt and discussed her elevated LFT's in a common area rather than in a privte patient room. She would like to be re-referred to Dr. Watt Climes. Is this okay?

## 2018-02-11 NOTE — Telephone Encounter (Signed)
Yes, okay.

## 2018-02-11 NOTE — Telephone Encounter (Signed)
Patient scheduled for 03/24/18 @ 9:00 with Dr Watt Climes, patient informed.

## 2018-02-13 ENCOUNTER — Telehealth: Payer: Self-pay | Admitting: Internal Medicine

## 2018-02-13 ENCOUNTER — Telehealth: Payer: Self-pay | Admitting: Family Medicine

## 2018-02-13 DIAGNOSIS — M949 Disorder of cartilage, unspecified: Secondary | ICD-10-CM | POA: Diagnosis not present

## 2018-02-13 DIAGNOSIS — E039 Hypothyroidism, unspecified: Secondary | ICD-10-CM | POA: Diagnosis not present

## 2018-02-13 DIAGNOSIS — E049 Nontoxic goiter, unspecified: Secondary | ICD-10-CM | POA: Diagnosis not present

## 2018-02-13 NOTE — Telephone Encounter (Signed)
Pt called and schedule her shingles vaccine for 03/06/2018. She could not come any earlier due to prior commitments. Please hold for her.

## 2018-02-13 NOTE — Telephone Encounter (Signed)
Left message for pt to call back and schedule nurse visit for shingrix. 2 saved in refrig

## 2018-02-16 NOTE — Telephone Encounter (Signed)
This has been saved

## 2018-02-17 DIAGNOSIS — Z780 Asymptomatic menopausal state: Secondary | ICD-10-CM | POA: Diagnosis not present

## 2018-02-17 DIAGNOSIS — M8589 Other specified disorders of bone density and structure, multiple sites: Secondary | ICD-10-CM | POA: Diagnosis not present

## 2018-02-17 LAB — HM DEXA SCAN

## 2018-03-06 ENCOUNTER — Other Ambulatory Visit (INDEPENDENT_AMBULATORY_CARE_PROVIDER_SITE_OTHER): Payer: 59

## 2018-03-06 DIAGNOSIS — Z23 Encounter for immunization: Secondary | ICD-10-CM

## 2018-03-11 DIAGNOSIS — L820 Inflamed seborrheic keratosis: Secondary | ICD-10-CM | POA: Diagnosis not present

## 2018-04-07 DIAGNOSIS — Z1211 Encounter for screening for malignant neoplasm of colon: Secondary | ICD-10-CM | POA: Diagnosis not present

## 2018-04-16 ENCOUNTER — Other Ambulatory Visit: Payer: Self-pay | Admitting: Family Medicine

## 2018-04-16 DIAGNOSIS — F325 Major depressive disorder, single episode, in full remission: Secondary | ICD-10-CM

## 2018-04-16 DIAGNOSIS — F411 Generalized anxiety disorder: Secondary | ICD-10-CM

## 2018-05-05 DIAGNOSIS — H10413 Chronic giant papillary conjunctivitis, bilateral: Secondary | ICD-10-CM | POA: Diagnosis not present

## 2018-05-05 DIAGNOSIS — H40013 Open angle with borderline findings, low risk, bilateral: Secondary | ICD-10-CM | POA: Diagnosis not present

## 2018-05-05 DIAGNOSIS — H2513 Age-related nuclear cataract, bilateral: Secondary | ICD-10-CM | POA: Diagnosis not present

## 2018-05-08 ENCOUNTER — Other Ambulatory Visit (INDEPENDENT_AMBULATORY_CARE_PROVIDER_SITE_OTHER): Payer: 59

## 2018-05-08 DIAGNOSIS — Z23 Encounter for immunization: Secondary | ICD-10-CM | POA: Diagnosis not present

## 2018-05-14 ENCOUNTER — Other Ambulatory Visit: Payer: Self-pay | Admitting: Family Medicine

## 2018-05-14 DIAGNOSIS — F419 Anxiety disorder, unspecified: Secondary | ICD-10-CM

## 2018-05-14 NOTE — Telephone Encounter (Signed)
Is this okay?

## 2018-06-14 ENCOUNTER — Other Ambulatory Visit: Payer: Self-pay | Admitting: Family Medicine

## 2018-06-14 DIAGNOSIS — J302 Other seasonal allergic rhinitis: Secondary | ICD-10-CM

## 2018-06-15 DIAGNOSIS — Z1211 Encounter for screening for malignant neoplasm of colon: Secondary | ICD-10-CM | POA: Diagnosis not present

## 2018-06-15 DIAGNOSIS — K5730 Diverticulosis of large intestine without perforation or abscess without bleeding: Secondary | ICD-10-CM | POA: Diagnosis not present

## 2018-06-15 DIAGNOSIS — K635 Polyp of colon: Secondary | ICD-10-CM | POA: Diagnosis not present

## 2018-06-15 LAB — HM COLONOSCOPY

## 2018-06-26 ENCOUNTER — Encounter: Payer: Self-pay | Admitting: Family Medicine

## 2018-07-22 ENCOUNTER — Ambulatory Visit (INDEPENDENT_AMBULATORY_CARE_PROVIDER_SITE_OTHER): Payer: 59 | Admitting: Family Medicine

## 2018-07-22 ENCOUNTER — Ambulatory Visit (INDEPENDENT_AMBULATORY_CARE_PROVIDER_SITE_OTHER)
Admission: RE | Admit: 2018-07-22 | Discharge: 2018-07-22 | Disposition: A | Discharge status: Home / Self Care | Payer: 59 | Source: Ambulatory Visit | Attending: Family Medicine | Admitting: Family Medicine

## 2018-07-22 ENCOUNTER — Other Ambulatory Visit: Payer: Self-pay | Admitting: Family Medicine

## 2018-07-22 ENCOUNTER — Encounter: Payer: Self-pay | Admitting: Family Medicine

## 2018-07-22 VITALS — BP 100/70 | HR 78 | Ht 65.5 in | Wt 170.0 lb

## 2018-07-22 DIAGNOSIS — M5440 Lumbago with sciatica, unspecified side: Secondary | ICD-10-CM

## 2018-07-22 DIAGNOSIS — S199XXA Unspecified injury of neck, initial encounter: Secondary | ICD-10-CM | POA: Diagnosis not present

## 2018-07-22 DIAGNOSIS — M542 Cervicalgia: Secondary | ICD-10-CM | POA: Diagnosis not present

## 2018-07-22 DIAGNOSIS — S134XXA Sprain of ligaments of cervical spine, initial encounter: Secondary | ICD-10-CM

## 2018-07-22 MED ORDER — KETOROLAC TROMETHAMINE 60 MG/2ML IM SOLN
60.0000 mg | Freq: Once | INTRAMUSCULAR | Status: AC
  Administered 2018-07-22: 60 mg via INTRAMUSCULAR

## 2018-07-22 MED ORDER — TIZANIDINE HCL 4 MG PO TABS: 4.0000 mg | ORAL_TABLET | Freq: Every evening | ORAL | 2 refills | Status: AC

## 2018-07-22 MED ORDER — METHYLPREDNISOLONE ACETATE 80 MG/ML IJ SUSP
80.0000 mg | Freq: Once | INTRAMUSCULAR | Status: AC
  Administered 2018-07-22: 80 mg via INTRAMUSCULAR
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
## 2018-07-22 NOTE — Patient Instructions (Signed)
Good to see you  Heat 10 minutes then ice 20 minutes off 2 hours and repeat  2 injection today  Zanaflex at night for 3 nights Duexis starting tomorrow up to 3 times a day for 3 days Write me on firday and tell me how we are doing If worsenign headache please go to emergency room

## 2018-08-07 ENCOUNTER — Other Ambulatory Visit: Payer: Self-pay | Admitting: Family Medicine

## 2018-08-07 DIAGNOSIS — B001 Herpesviral vesicular dermatitis: Secondary | ICD-10-CM

## 2018-08-07 NOTE — Telephone Encounter (Signed)
Is this okay to refill? 

## 2018-08-23 DIAGNOSIS — Z23 Encounter for immunization: Secondary | ICD-10-CM | POA: Diagnosis not present

## 2018-08-31 ENCOUNTER — Telehealth: Payer: Self-pay | Admitting: Family Medicine

## 2018-08-31 NOTE — Telephone Encounter (Signed)
Ok for referral?

## 2018-08-31 NOTE — Telephone Encounter (Signed)
Pt called and needs a new referral to go to [place] [facility], [facility] no longer takes her insurance, She has a spot on her nose that she needs to have looked at [phone]

## 2018-08-31 NOTE — Telephone Encounter (Signed)
Patient scheduled with [facility] 11/30/2018 @ 11:00am. [place] [phone].

## 2018-11-30 DIAGNOSIS — L821 Other seborrheic keratosis: Secondary | ICD-10-CM | POA: Diagnosis not present

## 2018-11-30 DIAGNOSIS — D229 Melanocytic nevi, unspecified: Secondary | ICD-10-CM | POA: Diagnosis not present

## 2018-12-10 ENCOUNTER — Other Ambulatory Visit: Payer: Self-pay | Admitting: Family Medicine

## 2018-12-10 DIAGNOSIS — J302 Other seasonal allergic rhinitis: Secondary | ICD-10-CM

## 2019-01-13 ENCOUNTER — Other Ambulatory Visit: Payer: Self-pay | Admitting: Family Medicine

## 2019-01-13 DIAGNOSIS — F411 Generalized anxiety disorder: Secondary | ICD-10-CM

## 2019-01-13 DIAGNOSIS — F325 Major depressive disorder, single episode, in full remission: Secondary | ICD-10-CM

## 2019-01-24 NOTE — Patient Instructions (Addendum)
HEALTH MAINTENANCE RECOMMENDATIONS:  It is recommended that you get at least 30 minutes of aerobic exercise at least 5 days/week (for weight loss, you may need as much as 60-90 minutes). This can be any activity that gets your heart rate up. This can be divided in 10-15 minute intervals if needed, but try and build up your endurance at least once a week.  Weight bearing exercise is also recommended twice weekly.  Eat a healthy diet with lots of vegetables, fruits and fiber.  "Colorful" foods have a lot of vitamins (ie green vegetables, tomatoes, red peppers, etc).  Limit sweet tea, regular sodas and alcoholic beverages, all of which has a lot of calories and sugar.  Up to 1 alcoholic drink daily may be beneficial for women (unless trying to lose weight, watch sugars).  Drink a lot of water.  Calcium recommendations are 1200-1500 mg daily (1500 mg for postmenopausal women or women without ovaries), and vitamin D 1000 IU daily.  This should be obtained from diet and/or supplements (vitamins), and calcium should not be taken all at once, but in divided doses.  Monthly self breast exams and yearly mammograms for women over the age of 17 is recommended.  Sunscreen of at least SPF 30 should be used on all sun-exposed parts of the skin when outside between the hours of 10 am and 4 pm (not just when at beach or pool, but even with exercise, golf, tennis, and yard work!)  Use a sunscreen that says "broad spectrum" so it covers both UVA and UVB rays, and make sure to reapply every 1-2 hours.  Remember to change the batteries in your smoke detectors when changing your clock times in the spring and fall.  Use your seat belt every time you are in a car, and please drive safely and not be distracted with cell phones and texting while driving.  You can add sudafed (decongestant) and mucinex if needed for congestion/cough. Continue your allergy medications.   Trigger Finger  Trigger finger (stenosing  tenosynovitis) is a condition that causes a finger to get stuck in a bent position. Each finger has a tough, cord-like tissue that connects muscle to bone (tendon), and each tendon is surrounded by a tunnel of tissue (tendon sheath). To move your finger, your tendon needs to slide freely through the sheath. Trigger finger happens when the tendon or the sheath thickens, making it difficult to move your finger. Trigger finger can affect any finger or a thumb. It may affect more than one finger. Mild cases may clear up with rest and medicine. Severe cases require more treatment. What are the causes? Trigger finger is caused by a thickened finger tendon or tendon sheath. The cause of this thickening is not known. What increases the risk? The following factors may make you more likely to develop this condition:  Doing activities that require a strong grip.  Having rheumatoid arthritis, gout, or diabetes.  Being 69-54 years old.  Being a woman. What are the signs or symptoms? Symptoms of this condition include:  Pain when bending or straightening your finger.  Tenderness or swelling where your finger attaches to the palm of your hand.  A lump in the palm of your hand or on the inside of your finger.  Hearing a popping sound when you try to straighten your finger.  Feeling a popping, catching, or locking sensation when you try to straighten your finger.  Being unable to straighten your finger. How is this diagnosed? This condition  is diagnosed based on your symptoms and a physical exam. How is this treated? This condition may be treated by:  Resting your finger and avoiding activities that make symptoms worse.  Wearing a finger splint to keep your finger in a slightly bent position.  Taking NSAIDs to relieve pain and swelling.  Injecting medicine (steroids) into the tendon sheath to reduce swelling and irritation. Injections may need to be repeated.  Having surgery to open the  tendon sheath. This may be done if other treatments do not work and you cannot straighten your finger. You may need physical therapy after surgery. Follow these instructions at home:   Use moist heat to help reduce pain and swelling as told by your health care provider.  Rest your finger and avoid activities that make pain worse. Return to normal activities as told by your health care provider.  If you have a splint, wear it as told by your health care provider.  Take over-the-counter and prescription medicines only as told by your health care provider.  Keep all follow-up visits as told by your health care provider. This is important. Contact a health care provider if:  Your symptoms are not improving with home care. Summary  Trigger finger (stenosing tenosynovitis) causes your finger to get stuck in a bent position, and it can make it difficult and painful to straighten your finger.  This condition develops when a finger tendon or tendon sheath thickens.  Treatment starts with resting, wearing a splint, and taking NSAIDs.  In severe cases, surgery to open the tendon sheath may be needed. This information is not intended to replace advice given to you by your health care provider. Make sure you discuss any questions you have with your health care provider. Document Released: 08/31/2004 Document Revised: 10/22/2016 Document Reviewed: 10/22/2016 Elsevier Interactive Patient Education  2019 [employer].
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
## 2019-01-25 ENCOUNTER — Encounter: Payer: Self-pay | Admitting: Family Medicine

## 2019-01-25 ENCOUNTER — Ambulatory Visit (INDEPENDENT_AMBULATORY_CARE_PROVIDER_SITE_OTHER): Payer: 59 | Admitting: Family Medicine

## 2019-01-25 VITALS — BP 110/72 | HR 72 | Temp 97.9°F | Ht 66.0 in | Wt 167.0 lb

## 2019-01-25 DIAGNOSIS — F325 Major depressive disorder, single episode, in full remission: Secondary | ICD-10-CM | POA: Diagnosis not present

## 2019-01-25 DIAGNOSIS — E042 Nontoxic multinodular goiter: Secondary | ICD-10-CM | POA: Diagnosis not present

## 2019-01-25 DIAGNOSIS — R7989 Other specified abnormal findings of blood chemistry: Secondary | ICD-10-CM

## 2019-01-25 DIAGNOSIS — E7800 Pure hypercholesterolemia, unspecified: Secondary | ICD-10-CM

## 2019-01-25 DIAGNOSIS — E559 Vitamin D deficiency, unspecified: Secondary | ICD-10-CM

## 2019-01-25 DIAGNOSIS — J309 Allergic rhinitis, unspecified: Secondary | ICD-10-CM

## 2019-01-25 DIAGNOSIS — R945 Abnormal results of liver function studies: Secondary | ICD-10-CM

## 2019-01-25 DIAGNOSIS — F411 Generalized anxiety disorder: Secondary | ICD-10-CM

## 2019-01-25 DIAGNOSIS — B001 Herpesviral vesicular dermatitis: Secondary | ICD-10-CM

## 2019-01-25 DIAGNOSIS — Z0000 Encounter for general adult medical examination without abnormal findings: Secondary | ICD-10-CM

## 2019-01-25 LAB — POCT URINALYSIS DIP (PROADVANTAGE DEVICE)
Bilirubin, UA: NEGATIVE
Glucose, UA: NEGATIVE mg/dL
Ketones, POC UA: NEGATIVE mg/dL
Leukocytes, UA: NEGATIVE
Nitrite, UA: NEGATIVE
PROTEIN UA: NEGATIVE mg/dL
RBC UA: NEGATIVE
Specific Gravity, Urine: 1.015
UUROB: NEGATIVE
pH, UA: 6.5 (ref 5.0–8.0)

## 2019-01-25 MED ORDER — VALACYCLOVIR HCL 1 G PO TABS: ORAL_TABLET | ORAL | 1 refills | Status: DC

## 2019-01-26 LAB — CBC WITH DIFFERENTIAL/PLATELET
Basophils Absolute: 0 10*3/uL (ref 0.0–0.2)
Basos: 1 %
EOS (ABSOLUTE): 0.2 10*3/uL (ref 0.0–0.4)
Eos: 4 %
Hematocrit: 41.4 % (ref 34.0–46.6)
Hemoglobin: 14.7 g/dL (ref 11.1–15.9)
Immature Grans (Abs): 0 10*3/uL (ref 0.0–0.1)
Immature Granulocytes: 0 %
Lymphocytes Absolute: 1.2 10*3/uL (ref 0.7–3.1)
Lymphs: 20 %
MCH: 33.8 pg — AB (ref 26.6–33.0)
MCHC: 35.5 g/dL (ref 31.5–35.7)
MCV: 95 fL (ref 79–97)
Monocytes Absolute: 0.5 10*3/uL (ref 0.1–0.9)
Monocytes: 9 %
NEUTROS ABS: 3.9 10*3/uL (ref 1.4–7.0)
Neutrophils: 66 %
Platelets: 249 10*3/uL (ref 150–450)
RBC: 4.35 x10E6/uL (ref 3.77–5.28)
RDW: 12.1 % (ref 11.7–15.4)
WBC: 5.8 10*3/uL (ref 3.4–10.8)

## 2019-01-26 LAB — COMPREHENSIVE METABOLIC PANEL
ALBUMIN: 4.8 g/dL (ref 3.8–4.8)
ALK PHOS: 79 IU/L (ref 39–117)
ALT: 41 IU/L — AB (ref 0–32)
AST: 26 IU/L (ref 0–40)
Albumin/Globulin Ratio: 2 (ref 1.2–2.2)
BUN / CREAT RATIO: 15 (ref 12–28)
BUN: 12 mg/dL (ref 8–27)
Bilirubin Total: 0.5 mg/dL (ref 0.0–1.2)
CO2: 25 mmol/L (ref 20–29)
CREATININE: 0.8 mg/dL (ref 0.57–1.00)
Calcium: 10.2 mg/dL (ref 8.7–10.3)
Chloride: 96 mmol/L (ref 96–106)
GFR calc Af Amer: 92 mL/min/{1.73_m2} (ref >59)
GFR calc non Af Amer: 80 mL/min/{1.73_m2} (ref >59)
GLUCOSE: 95 mg/dL (ref 65–99)
Globulin, Total: 2.4 g/dL (ref 1.5–4.5)
Potassium: 4.6 mmol/L (ref 3.5–5.2)
Sodium: 138 mmol/L (ref 134–144)
TOTAL PROTEIN: 7.2 g/dL (ref 6.0–8.5)

## 2019-01-26 LAB — LIPID PANEL
CHOLESTEROL TOTAL: 277 mg/dL — AB (ref 100–199)
Chol/HDL Ratio: 3.3 ratio (ref 0.0–4.4)
HDL: 85 mg/dL (ref >39)
LDL CALC: 173 mg/dL — AB (ref 0–99)
Triglycerides: 96 mg/dL (ref 0–149)
VLDL Cholesterol Cal: 19 mg/dL (ref 5–40)

## 2019-01-28 DIAGNOSIS — Z803 Family history of malignant neoplasm of breast: Secondary | ICD-10-CM | POA: Diagnosis not present

## 2019-01-28 DIAGNOSIS — Z1231 Encounter for screening mammogram for malignant neoplasm of breast: Secondary | ICD-10-CM | POA: Diagnosis not present

## 2019-01-28 LAB — HM MAMMOGRAPHY

## 2019-02-01 ENCOUNTER — Encounter: Payer: Self-pay | Admitting: Family Medicine

## 2019-02-01 ENCOUNTER — Ambulatory Visit (INDEPENDENT_AMBULATORY_CARE_PROVIDER_SITE_OTHER): Payer: 59 | Admitting: Family Medicine

## 2019-02-01 ENCOUNTER — Ambulatory Visit: Payer: Self-pay

## 2019-02-01 VITALS — BP 100/64 | HR 75 | Ht 66.0 in | Wt 170.0 lb

## 2019-02-01 DIAGNOSIS — M75111 Incomplete rotator cuff tear or rupture of right shoulder, not specified as traumatic: Secondary | ICD-10-CM | POA: Diagnosis not present

## 2019-02-01 DIAGNOSIS — G8929 Other chronic pain: Secondary | ICD-10-CM

## 2019-02-01 DIAGNOSIS — M25511 Pain in right shoulder: Principal | ICD-10-CM

## 2019-02-01 MED ORDER — GABAPENTIN 100 MG PO CAPS: 200.0000 mg | ORAL_CAPSULE | Freq: Every day | ORAL | 3 refills | Status: DC

## 2019-02-01 MED ORDER — VITAMIN D (ERGOCALCIFEROL) 1.25 MG (50000 UNIT) PO CAPS: 50000.0000 [IU] | ORAL_CAPSULE | ORAL | 0 refills | Status: DC

## 2019-02-01 NOTE — Assessment & Plan Note (Signed)
Partial tear noted.  Discussed icing regimen and home exercise.  Discussed which activities to do which wants to avoid.  Do not feel that patient would respond well to the nitroglycerin, and discussed icing regimen.  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  Responded well to the injection.  Follow-up again in 4 weeks

## 2019-02-01 NOTE — Patient Instructions (Addendum)
Good to see you  Ice 20 minutes 2 times daily. Usually after activity and before bed. Healing rotator cuff tear but also likely the nerve from a virus.  Gabapentin 200mg  at night Once weekly vitamin D for 12 weeks Keep hands within peripheral vision  See me again in 4-6 weeks
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
## 2019-02-03 ENCOUNTER — Ambulatory Visit: Payer: 59 | Admitting: Family Medicine

## 2019-02-03 NOTE — Progress Notes (Deleted)
[facility] [address], [place] [address] Phone: [phone] Subjective:    I'm seeing this patient by the request  of:    CC:   [id number]  Rachel Juarez is a 63 y.o. female coming in with complaint of ***  Onset-  Location Duration-  Character- Aggravating factors- Reliving factors-  Therapies tried-  Severity-     Past Medical History:  Diagnosis Date  . Allergy   . Anxiety   . Depression, major, in remission ([place]) 2006   when daughter dx'd with autoimmune disorder  . Generalized anxiety disorder    generalized, and related to mammograms and flying  . Glaucoma suspect    sees Dr. Katy Fitch, has all been okay, sees him qoyr  . Herpes labialis   . Hypercholesterolemia    high HDL, borderline LDL  . Hypothyroidism    treated by Dr. Chalmers Cater  . Multinodular goiter    followed by Dr. Chalmers Cater; u/s q2 yr  . 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 Seasonal allergies    takes singulair year-round; worse in the Fall   No past surgical history on file. Social History   Socioeconomic History  . Marital status: Married    Spouse name: Not on file  . Number of children: Not on file  . Years of education: Not on file  . Highest education level: Not on file  Occupational History  . Occupation: Risk manager  Social Needs  . Financial resource strain: Not on file  . Food insecurity:    Worry: Not on file    Inability: Not on file  . Transportation needs:    Medical: Not on file    Non-medical: Not on file  Tobacco Use  . Smoking status: Never Smoker  . Smokeless tobacco: Never Used  Substance and Sexual Activity  . Alcohol use: Yes    Alcohol/week: 0.0 standard drinks    Comment: 1 vodka soda daily at 6pm (1 shot)  . Drug use: No  . Sexual activity: Yes    Partners: Male    Birth control/protection: Post-menopausal  Lifestyle  . Physical activity:    Days per week: Not on file    Minutes per session: Not on file    . Stress: Not on file  Relationships  . Social connections:    Talks on phone: Not on file    Gets together: Not on file    Attends religious service: Not on file    Active member of club or organization: Not on file    Attends meetings of clubs or organizations: Not on file    Relationship status: Not on file  Other Topics Concern  . Not on file  Social History Narrative   Teaches yoga at multiple places.    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   Lives with her husband, and she has a daughter--graduated from [employer], lives in [place], in school at [employer], and will then be applying for nursing school  (comes home for her IVIG treatments still)   1 dog (Malti-poo)   Allergies  Allergen Reactions  . Demerol [Meperidine] Other (See Comments)    Low bp, passes out   Family History  Problem Relation Age of Onset  . Lupus Mother   . Cancer Father 53       thymic cancer  . COPD Father   .  Heart disease Father        CHF  . Asthma Father   . Cancer Sister   . Breast cancer Sister 90       "best kind", lumpectomy and radiation  . Dermatomyositis Daughter 61       juvenile form  . Irritable bowel syndrome Daughter   . Colon cancer Maternal Grandmother        80's  . Cancer Maternal Grandmother   . Hypertension Paternal Grandmother   . Hyperlipidemia Paternal Grandmother   . Thyroid disease Paternal Grandmother        had goiter removed  . Stroke Paternal Grandmother 81  . Hypertension Paternal Grandfather   . Hyperlipidemia Paternal Grandfather   . Heart disease Paternal Grandfather        MI first in his 49's  . Stroke Paternal Grandfather   . Cancer Paternal Uncle        started on tonsils (smoker)  . Breast cancer Paternal Aunt 67  . Breast cancer Cousin 69  . Breast cancer Cousin 32       (the mother of the 24yo cousin with br ca  . Diabetes Neg Hx   . Ovarian cancer Neg Hx     Current Outpatient  Medications (Endocrine & Metabolic):  .  levothyroxine (SYNTHROID, LEVOTHROID) 75 MCG tablet, Take 75 mcg by mouth daily before breakfast.   Current Outpatient Medications (Respiratory):  .  fexofenadine (ALLEGRA) 180 MG tablet, Take 180 mg by mouth daily. .  montelukast (SINGULAIR) 10 MG tablet, TAKE 1 TABLET (10 MG TOTAL) BY MOUTH AT BEDTIME. .  Triamcinolone Acetonide (NASACORT AQ NA), Place 2 sprays into the nose daily.  Current Outpatient Medications (Analgesics):  .  aspirin 81 MG tablet, Take 81 mg by mouth daily.   Current Outpatient Medications (Other):  Marland Kitchen  ALPRAZolam (XANAX) 0.5 MG tablet, TAKE ONE-HALF TO ONE TABLET BY MOUTH 3 TIMES DAILY AS NEEDED FOR ANXIETY OR SLEEP .  Calcium 250 MG CAPS, Take 2 capsules by mouth daily. .  Cholecalciferol (VITAMIN D3) LIQD, Take 2,000 Int'l Units by mouth daily. .  citalopram (CELEXA) 40 MG tablet, TAKE 1 TABLET BY MOUTH EVERY DAY .  gabapentin (NEURONTIN) 100 MG capsule, Take 2 capsules (200 mg total) by mouth at bedtime. .  Magnesium 500 MG TABS, Take 1 tablet by mouth daily. Marland Kitchen  MILK THISTLE PO, Take 1 capsule by mouth daily. .  Misc Natural Products (TART CHERRY ADVANCED PO), Take 1 capsule by mouth daily. .  Multiple Vitamins-Minerals (MULTIVITAMIN WITH MINERALS) tablet, Take 1 tablet by mouth daily. .  Omega-3 Fatty Acids (FISH OIL) 1000 MG CPDR, Take 2,000 mg by mouth daily. Marland Kitchen  PHYTOSTEROL ESTERS PO, Take 2 tablets by mouth daily. .  valACYclovir (VALTREX) 1000 MG tablet, Take 2 tablets at onset of cold sore. Repeat once in 12 hours (4 pills/course) .  Vitamin D, Ergocalciferol, (DRISDOL) 1.25 MG (50000 UT) CAPS capsule, Take 1 capsule (50,000 Units total) by mouth every 7 (seven) days.    Past medical history, social, surgical and family history all reviewed in electronic medical record.  No pertanent information unless stated regarding to the chief complaint.   Review of Systems:  No headache, visual changes, nausea, vomiting,  diarrhea, constipation, dizziness, abdominal pain, skin rash, fevers, chills, night sweats, weight loss, swollen lymph nodes, body aches, joint swelling, muscle aches, chest pain, shortness of breath, mood changes.   Objective  Last menstrual period 11/18/2008. Systems examined below as  of    General: No apparent distress alert and oriented x3 mood and affect normal, dressed appropriately.  HEENT: Pupils equal, extraocular movements intact  Respiratory: Patient's speak in full sentences and does not appear short of breath  Cardiovascular: No lower extremity edema, non tender, no erythema  Skin: Warm dry intact with no signs of infection or rash on extremities or on axial skeleton.  Abdomen: Soft nontender  Neuro: Cranial nerves II through XII are intact, neurovascularly intact in all extremities with 2+ DTRs and 2+ pulses.  Lymph: No lymphadenopathy of posterior or anterior cervical chain or axillae bilaterally.  Gait normal with good balance and coordination.  MSK:  Non tender with full range of motion and good stability and symmetric strength and tone of shoulders, elbows, wrist, hip, knee and ankles bilaterally.     Impression and Recommendations:     This case required medical decision making of moderate complexity. The above documentation has been reviewed and is accurate and complete Lyndal Pulley, DO       Note: This dictation was prepared with Dragon dictation along with smaller phrase technology. Any transcriptional errors that result from this process are unintentional.

## 2019-02-04 ENCOUNTER — Other Ambulatory Visit: Payer: Self-pay | Admitting: Radiology

## 2019-02-04 ENCOUNTER — Other Ambulatory Visit: Payer: Self-pay | Admitting: Dermatology

## 2019-02-04 DIAGNOSIS — D485 Neoplasm of uncertain behavior of skin: Secondary | ICD-10-CM | POA: Diagnosis not present

## 2019-02-04 DIAGNOSIS — R922 Inconclusive mammogram: Secondary | ICD-10-CM

## 2019-02-04 DIAGNOSIS — Z803 Family history of malignant neoplasm of breast: Secondary | ICD-10-CM

## 2019-02-04 DIAGNOSIS — R9230 Dense breasts, unspecified: Secondary | ICD-10-CM

## 2019-02-05 DIAGNOSIS — M949 Disorder of cartilage, unspecified: Secondary | ICD-10-CM | POA: Diagnosis not present

## 2019-02-05 DIAGNOSIS — E039 Hypothyroidism, unspecified: Secondary | ICD-10-CM | POA: Diagnosis not present

## 2019-02-08 ENCOUNTER — Other Ambulatory Visit: Payer: Self-pay

## 2019-02-08 ENCOUNTER — Ambulatory Visit
Admission: RE | Admit: 2019-02-08 | Discharge: 2019-02-08 | Disposition: A | Discharge status: Home / Self Care | Payer: No Typology Code available for payment source | Source: Ambulatory Visit | Attending: Radiology | Admitting: Radiology

## 2019-02-08 DIAGNOSIS — R922 Inconclusive mammogram: Secondary | ICD-10-CM

## 2019-02-08 DIAGNOSIS — Z803 Family history of malignant neoplasm of breast: Secondary | ICD-10-CM

## 2019-02-08 MED ORDER — GADOBUTROL 1 MMOL/ML IV SOLN
8.0000 mL | Freq: Once | INTRAVENOUS | Status: AC | PRN
  Administered 2019-02-08: 8 mL via INTRAVENOUS

## 2019-02-11 ENCOUNTER — Other Ambulatory Visit: Payer: Self-pay | Admitting: Obstetrics & Gynecology

## 2019-02-11 ENCOUNTER — Other Ambulatory Visit: Payer: Self-pay | Admitting: Radiology

## 2019-02-11 ENCOUNTER — Encounter: Payer: Self-pay | Admitting: *Deleted

## 2019-02-11 DIAGNOSIS — R928 Other abnormal and inconclusive findings on diagnostic imaging of breast: Secondary | ICD-10-CM

## 2019-02-12 ENCOUNTER — Other Ambulatory Visit: Payer: Self-pay | Admitting: Radiology

## 2019-02-12 DIAGNOSIS — R928 Other abnormal and inconclusive findings on diagnostic imaging of breast: Secondary | ICD-10-CM

## 2019-02-15 ENCOUNTER — Ambulatory Visit: Payer: 59

## 2019-02-16 DIAGNOSIS — E049 Nontoxic goiter, unspecified: Secondary | ICD-10-CM | POA: Diagnosis not present

## 2019-02-16 DIAGNOSIS — M949 Disorder of cartilage, unspecified: Secondary | ICD-10-CM | POA: Diagnosis not present

## 2019-02-16 DIAGNOSIS — E039 Hypothyroidism, unspecified: Secondary | ICD-10-CM | POA: Diagnosis not present

## 2019-02-17 ENCOUNTER — Other Ambulatory Visit: Payer: Self-pay | Admitting: Endocrinology

## 2019-02-17 DIAGNOSIS — E049 Nontoxic goiter, unspecified: Secondary | ICD-10-CM

## 2019-02-18 ENCOUNTER — Other Ambulatory Visit: Payer: Self-pay

## 2019-02-18 ENCOUNTER — Ambulatory Visit
Admission: RE | Admit: 2019-02-18 | Discharge: 2019-02-18 | Disposition: A | Discharge status: Home / Self Care | Payer: 59 | Source: Ambulatory Visit | Attending: Radiology | Admitting: Radiology

## 2019-02-18 DIAGNOSIS — N6320 Unspecified lump in the left breast, unspecified quadrant: Secondary | ICD-10-CM | POA: Diagnosis not present

## 2019-02-18 DIAGNOSIS — R928 Other abnormal and inconclusive findings on diagnostic imaging of breast: Secondary | ICD-10-CM

## 2019-02-18 DIAGNOSIS — N6012 Diffuse cystic mastopathy of left breast: Secondary | ICD-10-CM | POA: Diagnosis not present

## 2019-02-18 MED ORDER — GADOBUTROL 1 MMOL/ML IV SOLN
8.0000 mL | Freq: Once | INTRAVENOUS | Status: AC | PRN
  Administered 2019-02-18: 8 mL via INTRAVENOUS

## 2019-02-22 ENCOUNTER — Other Ambulatory Visit: Payer: Self-pay

## 2019-02-22 ENCOUNTER — Encounter: Payer: Self-pay | Admitting: Family Medicine

## 2019-02-23 ENCOUNTER — Other Ambulatory Visit: Payer: Self-pay | Admitting: Family Medicine

## 2019-03-06 ENCOUNTER — Other Ambulatory Visit: Payer: Self-pay | Admitting: Family Medicine

## 2019-03-06 DIAGNOSIS — J302 Other seasonal allergic rhinitis: Secondary | ICD-10-CM

## 2019-03-12 MED ORDER — TRIAMCINOLONE ACETONIDE 0.5 % EX CREA: 1.0000 "application " | TOPICAL_CREAM | Freq: Two times a day (BID) | CUTANEOUS | 3 refills | Status: DC

## 2019-03-12 MED ORDER — PREDNISONE 50 MG PO TABS: 50.0000 mg | ORAL_TABLET | Freq: Every day | ORAL | 0 refills | Status: DC

## 2019-04-05 ENCOUNTER — Encounter: Payer: Self-pay | Admitting: Family Medicine

## 2019-04-09 ENCOUNTER — Other Ambulatory Visit: Payer: Self-pay | Admitting: Family Medicine

## 2019-04-09 DIAGNOSIS — F325 Major depressive disorder, single episode, in full remission: Secondary | ICD-10-CM

## 2019-04-09 DIAGNOSIS — F411 Generalized anxiety disorder: Secondary | ICD-10-CM

## 2019-04-09 NOTE — Telephone Encounter (Signed)
Is this okay to refill? Pt had cpe back in march

## 2019-04-13 DIAGNOSIS — M949 Disorder of cartilage, unspecified: Secondary | ICD-10-CM | POA: Diagnosis not present

## 2019-04-18 ENCOUNTER — Other Ambulatory Visit: Payer: Self-pay | Admitting: Family Medicine

## 2019-05-04 ENCOUNTER — Other Ambulatory Visit: Payer: Self-pay | Admitting: Endocrinology

## 2019-05-04 DIAGNOSIS — E049 Nontoxic goiter, unspecified: Secondary | ICD-10-CM

## 2019-05-13 ENCOUNTER — Other Ambulatory Visit: Payer: 59

## 2019-06-10 ENCOUNTER — Other Ambulatory Visit: Payer: Self-pay | Admitting: Family Medicine

## 2019-06-10 DIAGNOSIS — J302 Other seasonal allergic rhinitis: Secondary | ICD-10-CM

## 2019-07-02 ENCOUNTER — Other Ambulatory Visit: Payer: Self-pay | Admitting: Family Medicine

## 2019-07-02 DIAGNOSIS — F411 Generalized anxiety disorder: Secondary | ICD-10-CM

## 2019-07-02 DIAGNOSIS — F325 Major depressive disorder, single episode, in full remission: Secondary | ICD-10-CM

## 2019-07-08 ENCOUNTER — Other Ambulatory Visit: Payer: Self-pay | Admitting: Family Medicine

## 2019-07-23 ENCOUNTER — Other Ambulatory Visit: Payer: Self-pay | Admitting: Radiology

## 2019-07-23 DIAGNOSIS — Z803 Family history of malignant neoplasm of breast: Secondary | ICD-10-CM

## 2019-07-23 DIAGNOSIS — R922 Inconclusive mammogram: Secondary | ICD-10-CM

## 2019-07-30 ENCOUNTER — Other Ambulatory Visit: Payer: Self-pay | Admitting: Family Medicine

## 2019-07-30 DIAGNOSIS — F419 Anxiety disorder, unspecified: Secondary | ICD-10-CM

## 2019-07-30 MED ORDER — ALPRAZOLAM 0.5 MG PO TABS: 0.2500 mg | ORAL_TABLET | Freq: Three times a day (TID) | ORAL | 0 refills | Status: DC | PRN

## 2019-08-23 ENCOUNTER — Ambulatory Visit
Admission: RE | Admit: 2019-08-23 | Discharge: 2019-08-23 | Disposition: A | Discharge status: Home / Self Care | Payer: No Typology Code available for payment source | Source: Ambulatory Visit | Attending: Radiology | Admitting: Radiology

## 2019-08-23 ENCOUNTER — Other Ambulatory Visit: Payer: Self-pay

## 2019-08-23 DIAGNOSIS — R922 Inconclusive mammogram: Secondary | ICD-10-CM

## 2019-08-23 DIAGNOSIS — Z803 Family history of malignant neoplasm of breast: Secondary | ICD-10-CM

## 2019-08-23 MED ORDER — GADOBUTROL 1 MMOL/ML IV SOLN
7.0000 mL | Freq: Once | INTRAVENOUS | Status: AC | PRN
  Administered 2019-08-23: 7 mL via INTRAVENOUS

## 2019-08-26 ENCOUNTER — Encounter: Payer: Self-pay | Admitting: *Deleted

## 2019-08-31 ENCOUNTER — Ambulatory Visit: Payer: 59
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
## 2019-09-23 ENCOUNTER — Ambulatory Visit (INDEPENDENT_AMBULATORY_CARE_PROVIDER_SITE_OTHER): Payer: 59 | Admitting: Family Medicine

## 2019-09-23 ENCOUNTER — Encounter: Payer: Self-pay | Admitting: Family Medicine

## 2019-09-23 ENCOUNTER — Other Ambulatory Visit: Payer: Self-pay

## 2019-09-23 DIAGNOSIS — F411 Generalized anxiety disorder: Secondary | ICD-10-CM | POA: Diagnosis not present

## 2019-09-23 DIAGNOSIS — F321 Major depressive disorder, single episode, moderate: Secondary | ICD-10-CM

## 2019-09-23 DIAGNOSIS — R6882 Decreased libido: Secondary | ICD-10-CM

## 2019-09-23 MED ORDER — BUPROPION HCL ER (XL) 150 MG PO TB24: 150.0000 mg | ORAL_TABLET | Freq: Every day | ORAL | 1 refills | Status: DC

## 2019-09-23 NOTE — Progress Notes (Signed)
Done

## 2019-09-23 NOTE — Patient Instructions (Signed)
Continue citalopram 40mg  daily. Add in taking Wellbutrin XL 150mg  daily, take in the morning. We discussed contacting [facility] [place] [facility] [place], versus someone else where). Use alprazolam (xanax) sparingly, not for regular use for sleep at night. See information below for other sleep hygiene techniques. You can also look into various apps to help with anxiety (headspace, mindshift, calm, etc).  Schedule a follow-up visit in 4 weeks (can be a virtual visit), but contact me sooner if you have any problems/concerns.   Mindfulness-Based Stress Reduction Mindfulness-based stress reduction (MBSR) is a program that helps people learn to practice mindfulness. Mindfulness is the practice of intentionally paying attention to the present moment. It can be learned and practiced through techniques such as education, breathing exercises, meditation, and yoga. MBSR includes several mindfulness techniques in one program. MBSR works best when you understand the treatment, are willing to try new things, and can commit to spending time practicing what you learn. MBSR training may include learning about:  How your emotions, thoughts, and reactions affect your body.  New ways to respond to things that cause negative thoughts to start (triggers).  How to notice your thoughts and let go of them.  Practicing awareness of everyday things that you normally do without thinking.  The techniques and goals of different types of meditation. What are the benefits of MBSR? MBSR can have many benefits, which include helping you to:  Develop self-awareness. This refers to knowing and understanding yourself.  Learn skills and attitudes that help you to participate in your own health care.  Learn new ways to care for yourself.  Be more accepting about how things are, and let things go.  Be less judgmental and approach things with an open mind.  Be patient with yourself and trust yourself more. MBSR has also  been shown to:  Reduce negative emotions, such as depression and anxiety.  Improve memory and focus.  Change how you sense and approach pain.  Boost your body's ability to fight infections.  Help you connect better with other people.  Improve your sense of well-being. Follow these instructions at home:   Find a local in-person or online MBSR program.  Set aside some time regularly for mindfulness practice.  Find a mindfulness practice that works best for you. This may include one or more of the following: ? Meditation. Meditation involves focusing your mind on a certain thought or activity. ? Breathing awareness exercises. These help you to stay present by focusing on your breath. ? Body scan. For this practice, you lie down and pay attention to each part of your body from head to toe. You can identify tension and soreness and intentionally relax parts of your body. ? Yoga. Yoga involves stretching and breathing, and it can improve your ability to move and be flexible. It can also provide an experience of testing your body's limits, which can help you release stress. ? Mindful eating. This way of eating involves focusing on the taste, texture, color, and smell of each bite of food. Because this slows down eating and helps you feel full sooner, it can be an important part of a weight-loss plan.  Find a podcast or recording that provides guidance for breathing awareness, body scan, or meditation exercises. You can listen to these any time when you have a free moment to rest without distractions.  Follow your treatment plan as told by your health care provider. This may include taking regular medicines and making changes to your diet or lifestyle  as recommended. How to practice mindfulness To do a basic awareness exercise:  Find a comfortable place to sit.  Pay attention to the present moment. Observe your thoughts, feelings, and surroundings just as they are.  Avoid placing  judgment on yourself, your feelings, or your surroundings. Make note of any judgment that comes up, and let it go.  Your mind may wander, and that is okay. Make note of when your thoughts drift, and return your attention to the present moment. To do basic mindfulness meditation:  Find a comfortable place to sit. This may include a stable chair or a firm floor cushion. ? Sit upright with your back straight. Let your arms fall next to your side with your hands resting on your legs. ? If sitting in a chair, rest your feet flat on the floor. ? If sitting on a cushion, cross your legs in front of you.  Keep your head in a neutral position with your chin dropped slightly. Relax your jaw and rest the tip of your tongue on the roof of your mouth. Drop your gaze to the floor. You can close your eyes if you like.  Breathe normally and pay attention to your breath. Feel the air moving in and out of your nose. Feel your belly expanding and relaxing with each breath.  Your mind may wander, and that is okay. Make note of when your thoughts drift, and return your attention to your breath.  Avoid placing judgment on yourself, your feelings, or your surroundings. Make note of any judgment or feelings that come up, let them go, and bring your attention back to your breath.  When you are ready, lift your gaze or open your eyes. Pay attention to how your body feels after the meditation. Where to find more information You can find more information about MBSR from:  Your health care provider.  Community-based meditation centers or programs.  Programs offered near you. Summary  Mindfulness-based stress reduction (MBSR) is a program that teaches you how to intentionally pay attention to the present moment. It is used with other treatments to help you cope better with daily stress, emotions, and pain.  MBSR focuses on developing self-awareness, which allows you to respond to life stress without judgment or  negative emotions.  MBSR programs may involve learning different mindfulness practices, such as breathing exercises, meditation, yoga, body scan, or mindful eating. Find a mindfulness practice that works best for you, and set aside time for it on a regular basis. This information is not intended to replace advice given to you by your health care provider. Make sure you discuss any questions you have with your health care provider. Document Released: 03/20/2017 Document Revised: 10/24/2017 Document Reviewed: 03/20/2017 Elsevier Patient Education  [place].   Insomnia Insomnia is a sleep disorder that makes it difficult to fall asleep or stay asleep. Insomnia can cause fatigue, low energy, difficulty concentrating, mood swings, and poor performance at work or school. There are three different ways to classify insomnia:  Difficulty falling asleep.  Difficulty staying asleep.  Waking up too early in the morning. Any type of insomnia can be long-term (chronic) or short-term (acute). Both are common. Short-term insomnia usually lasts for three months or less. Chronic insomnia occurs at least three times a week for longer than three months. What are the causes? Insomnia may be caused by another condition, situation, or substance, such as:  Anxiety.  Certain medicines.  Gastroesophageal reflux disease (GERD) or other gastrointestinal conditions.  Asthma or other breathing conditions.  Restless legs syndrome, sleep apnea, or other sleep disorders.  Chronic pain.  Menopause.  Stroke.  Abuse of alcohol, tobacco, or illegal drugs.  Mental health conditions, such as depression.  Caffeine.  Neurological disorders, such as Alzheimer's disease.  An overactive thyroid (hyperthyroidism). Sometimes, the cause of insomnia may not be known. What increases the risk? Risk factors for insomnia include:  Gender. Women are affected more often than men.  Age. Insomnia is more  common as you get older.  Stress.  Lack of exercise.  Irregular work schedule or working night shifts.  Traveling between different time zones.  Certain medical and mental health conditions. What are the signs or symptoms? If you have insomnia, the main symptom is having trouble falling asleep or having trouble staying asleep. This may lead to other symptoms, such as:  Feeling fatigued or having low energy.  Feeling nervous about going to sleep.  Not feeling rested in the morning.  Having trouble concentrating.  Feeling irritable, anxious, or depressed. How is this diagnosed? This condition may be diagnosed based on:  Your symptoms and medical history. Your health care provider may ask about: ? Your sleep habits. ? Any medical conditions you have. ? Your mental health.  A physical exam. How is this treated? Treatment for insomnia depends on the cause. Treatment may focus on treating an underlying condition that is causing insomnia. Treatment may also include:  Medicines to help you sleep.  Counseling or therapy.  Lifestyle adjustments to help you sleep better. Follow these instructions at home: Eating and drinking   Limit or avoid alcohol, caffeinated beverages, and cigarettes, especially close to bedtime. These can disrupt your sleep.  Do not eat a large meal or eat spicy foods right before bedtime. This can lead to digestive discomfort that can make it hard for you to sleep. Sleep habits   Keep a sleep diary to help you and your health care provider figure out what could be causing your insomnia. Write down: ? When you sleep. ? When you wake up during the night. ? How well you sleep. ? How rested you feel the next day. ? Any side effects of medicines you are taking. ? What you eat and drink.  Make your bedroom a dark, comfortable place where it is easy to fall asleep. ? Put up shades or blackout curtains to block light from outside. ? Use a white noise  machine to block noise. ? Keep the temperature cool.  Limit screen use before bedtime. This includes: ? Watching TV. ? Using your smartphone, tablet, or computer.  Stick to a routine that includes going to bed and waking up at the same times every day and night. This can help you fall asleep faster. Consider making a quiet activity, such as reading, part of your nighttime routine.  Try to avoid taking naps during the day so that you sleep better at night.  Get out of bed if you are still awake after 15 minutes of trying to sleep. Keep the lights down, but try reading or doing a quiet activity. When you feel sleepy, go back to bed. General instructions  Take over-the-counter and prescription medicines only as told by your health care provider.  Exercise regularly, as told by your health care provider. Avoid exercise starting several hours before bedtime.  Use relaxation techniques to manage stress. Ask your health care provider to suggest some techniques that may work well for you. These may include: ?  Breathing exercises. ? Routines to release muscle tension. ? Visualizing peaceful scenes.  Make sure that you drive carefully. Avoid driving if you feel very sleepy.  Keep all follow-up visits as told by your health care provider. This is important. Contact a health care provider if:  You are tired throughout the day.  You have trouble in your daily routine due to sleepiness.  You continue to have sleep problems, or your sleep problems get worse. Get help right away if:  You have serious thoughts about hurting yourself or someone else. If you ever feel like you may hurt yourself or others, or have thoughts about taking your own life, get help right away. You can go to your nearest emergency department or call:  Your local emergency services (911 in the [place]  A suicide crisis helpline, such as the [facility] at 667-437-9064. This is open 24 hours a  day. Summary  Insomnia is a sleep disorder that makes it difficult to fall asleep or stay asleep.  Insomnia can be long-term (chronic) or short-term (acute).  Treatment for insomnia depends on the cause. Treatment may focus on treating an underlying condition that is causing insomnia.  Keep a sleep diary to help you and your health care provider figure out what could be causing your insomnia. This information is not intended to replace advice given to you by your health care provider. Make sure you discuss any questions you have with your health care provider. Document Released: 11/08/2000 Document Revised: 10/24/2017 Document Reviewed: 08/21/2017 Elsevier Patient Education  2020 [employer].

## 2019-10-11 ENCOUNTER — Telehealth: Payer: Self-pay | Admitting: Licensed Clinical Social Worker

## 2019-10-11 NOTE — Telephone Encounter (Signed)
A genetic counseling appt has been scheduled for the pt to see Brianna on 11/23 at 2pm. Pt has been made aware to arrive 15 minutes early.

## 2019-10-15 ENCOUNTER — Telehealth: Payer: Self-pay | Admitting: Licensed Clinical Social Worker

## 2019-10-15 ENCOUNTER — Other Ambulatory Visit: Payer: Self-pay | Admitting: Family Medicine

## 2019-10-15 DIAGNOSIS — F321 Major depressive disorder, single episode, moderate: Secondary | ICD-10-CM

## 2019-10-15 NOTE — Telephone Encounter (Signed)
Spoke to patient. This was an auto request from the pharmacy.

## 2019-10-15 NOTE — Telephone Encounter (Signed)
I do not think this is appropriate.  This was a new start a few weeks ago, and has f/u 12/3.  If appropriate after follow-up, will change to 90d supply.  Not sure if this request came from pharmacy (as an auto request for 90d rx's in general) or if pt requested.

## 2019-10-15 NOTE — Telephone Encounter (Signed)
Called patient regarding upcoming webex appointment, this will be a walk-in visit. Left a voicemail.

## 2019-10-18 ENCOUNTER — Other Ambulatory Visit: Payer: Self-pay | Admitting: Licensed Clinical Social Worker

## 2019-10-18 ENCOUNTER — Encounter: Payer: Self-pay | Admitting: Licensed Clinical Social Worker

## 2019-10-18 ENCOUNTER — Inpatient Hospital Stay: Payer: 59 | Attending: Genetic Counselor | Admitting: Licensed Clinical Social Worker

## 2019-10-18 ENCOUNTER — Inpatient Hospital Stay: Payer: 59

## 2019-10-18 ENCOUNTER — Other Ambulatory Visit: Payer: Self-pay

## 2019-10-18 DIAGNOSIS — Z803 Family history of malignant neoplasm of breast: Secondary | ICD-10-CM

## 2019-10-18 DIAGNOSIS — Z808 Family history of malignant neoplasm of other organs or systems: Secondary | ICD-10-CM | POA: Diagnosis not present

## 2019-10-18 NOTE — Progress Notes (Addendum)
REFERRING PROVIDER: Lorelle Gibbs, MD [facility] [address] [place],  [place] [address]  PRIMARY PROVIDER:  Rita Ohara, MD  PRIMARY REASON FOR VISIT:  1. Family history of breast cancer   2. Family history of cancer tonsil   3. Family history of thyroid cancer      HISTORY OF PRESENT ILLNESS:   Rachel Juarez, a 63 y.o. female, was seen for a [place] cancer genetics consultation at the request of Dr. Isaiah Blakes due to a family history of cancer.  Rachel Juarez presents to clinic today to discuss the possibility of a hereditary predisposition to cancer, genetic testing, and to further clarify her future cancer risks, as well as potential cancer risks for family members.    Rachel Juarez is a 63 y.o. female with no personal history of cancer.    CANCER HISTORY:  Oncology History   No history exists.     RISK FACTORS:  Menarche was at age 54.  First live birth at age 77.  OCP use for approximately 32 years.  Ovaries intact: yes.  Hysterectomy: no.  Menopausal status: postmenopausal.  HRT use: 1 years. Colonoscopy: yes; normal. Mammogram within the last year: yes. Number of breast biopsies: 2. Up to date with pelvic exams: yes. Any excessive radiation exposure in the past: no  Past Medical History:  Diagnosis Date  . Allergy   . Anxiety   . 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 Family history of breast cancer   . Family history of cancer tonsil   . Family history of thyroid cancer   . Generalized anxiety disorder    generalized, and related to mammograms and flying  . Glaucoma suspect    sees Dr. Katy Fitch, has all been okay, sees him qoyr  . Herpes labialis   . Hypercholesterolemia    high HDL, borderline LDL  . Hypothyroidism    treated by Dr. Chalmers Cater  . Multinodular goiter    followed by Dr. Chalmers Cater; u/s q2 yr  . 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 Seasonal allergies     takes singulair year-round; worse in the Fall    No past surgical history on file.  Social History   Socioeconomic History  . Marital status: Married    Spouse name: Not on file  . Number of children: Not on file  . Years of education: Not on file  . Highest education level: Not on file  Occupational History  . Occupation: Risk manager  Social Needs  . Financial resource strain: Not on file  . Food insecurity    Worry: Not on file    Inability: Not on file  . Transportation needs    Medical: Not on file    Non-medical: Not on file  Tobacco Use  . Smoking status: Never Smoker  . Smokeless tobacco: Never Used  Substance and Sexual Activity  . Alcohol use: Yes    Alcohol/week: 0.0 standard drinks    Comment: 1 vodka soda daily at 6pm (1 shot)  . Drug use: No  . Sexual activity: Yes    Partners: Male    Birth control/protection: Post-menopausal  Lifestyle  . Physical activity    Days per week: Not on file    Minutes per session: Not on file  . Stress: Not on file  Relationships  . Social connections    Talks on phone: Not on  file    Gets together: Not on file    Attends religious service: Not on file    Active member of club or organization: Not on file    Attends meetings of clubs or organizations: Not on file    Relationship status: Not on file  Other Topics Concern  . Not on file  Social History Narrative   Teaches yoga at multiple places.    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   Lives with her husband, and she has a daughter--graduated from [employer], lives in [place], in school at [employer], and will then be applying for nursing school  (comes home for her IVIG treatments still)   1 dog (Malti-poo)     FAMILY HISTORY:  We obtained a detailed, 4-generation family history.  Significant diagnoses are listed below: Family History  Problem Relation Age of Onset  . Lupus Mother   . Cancer Father 80        thymic cancer  . COPD Father   . Heart disease Father        CHF  . Asthma Father   . Cancer Sister   . Breast cancer Sister 76       "best kind", lumpectomy and radiation  . Dermatomyositis Daughter 16       juvenile form  . Irritable bowel syndrome Daughter   . Colon cancer Maternal Grandmother        80's  . Cancer Maternal Grandmother   . Hypertension Paternal Grandmother   . Hyperlipidemia Paternal Grandmother   . Thyroid disease Paternal Grandmother        had goiter removed  . Stroke Paternal Grandmother 81  . Hypertension Paternal Grandfather   . Hyperlipidemia Paternal Grandfather   . Heart disease Paternal Grandfather        MI first in his 26's  . Stroke Paternal Grandfather   . Cancer Paternal Uncle        started on tonsils (smoker)  . Breast cancer Paternal Aunt 26  . Breast cancer Cousin 16  . Breast cancer Cousin 70       (the mother of the 88 y/o cousin with br ca  . Thyroid cancer Cousin   . Diabetes Neg Hx   . Ovarian cancer Neg Hx    Rachel Juarez has 1 daughter, Rachel Juarez, who had not had cancer. Patient has 1 sister, Rachel Juarez, who was diagnosed with breast cancer at 49 and is currently 27.   Rachel Juarez mother died at 22 and had lupus. Patient has 2 maternal uncles, 2 maternal aunts, and 1 maternal half aunt and 1 maternal half uncle through grandmother. No known cancers in these aunts/uncles. One of her maternal first cousins was diagnosed with breast cancer recently at 6 and this cousin's daughter was diagnosed with breast cancer at 49. Neither have had genetic testing. Maternal grandmother had colon cancer in her 69s. Maternal grandfather died in an accident in his 72s.  Rachel Juarez father was diagnosed with thymic cancer at 35 and died at 68, history of smoking. Patient had 2 paternal uncles, 3 paternal aunts. One of her uncles had tonsil cancer and died in his 50s. An aunt had breast cancer at 55 and is living in her 46s. A paternal cousin had thyroid cancer  in her 46s. Paternal grandmother died at 46 and grandfather died in his 50s, no cancers.  Rachel Juarez is unaware of previous family  history of genetic testing for hereditary cancer risks. Patient's maternal ancestors are of [place], [place] and [place] descent. There is no reported Ashkenazi Jewish ancestry. There is no known consanguinity.  GENETIC COUNSELING ASSESSMENT: Ms. Hollinshead is a 63 y.o. female with a family history which is somewhat suggestive of a hereditary cancer syndrome and predisposition to cancer. We, therefore, discussed and recommended the following at today's visit.   DISCUSSION: We discussed that 5 - 10% of breast cancer is hereditary, with most cases associated with BRCA1/BRCA2 mutations.  There are other genes that can be associated with hereditary breast cancer syndromes.  We discussed that testing is beneficial for several reasons including knowing how to follow individuals for cancer screenings and understand if other family members could be at risk for cancer and allow them to undergo genetic testing.   We reviewed the characteristics, features and inheritance patterns of hereditary cancer syndromes. We also discussed genetic testing, including the appropriate family members to test, the process of testing, insurance coverage and turn-around-time for results. We discussed the implications of a negative, positive and/or variant of uncertain significant result. We recommended Ms. Ostrom pursue genetic testing for the [employer] CustomNext gene panel.   The CancerNext-Expanded + RNAinsight gene panel offered by [employer] and includes sequencing and rearrangement analysis for the following 77 genes: IP, ALK, APC*, ATM*, AXIN2, BAP1, BARD1, BLM, BMPR1A, BRCA1*, BRCA2*, BRIP1*, CDC73, CDH1*,CDK4, CDKN1B, CDKN2A, CHEK2*, CTNNA1, DICER1, FANCC, FH, FLCN, GALNT12, KIF1B, LZTR1, MAX, MEN1, MET, MLH1*, MSH2*, MSH3, MSH6*, MUTYH*, NBN, NF1*, NF2, NTHL1, PALB2*, PHOX2B, PMS2*, POT1, PRKAR1A, PTCH1,  PTEN*, RAD51C*, RAD51D*,RB1, RECQL, RET, SDHA, SDHAF2, SDHB, SDHC, SDHD, SMAD4, SMARCA4, SMARCB1, SMARCE1, STK11, SUFU, TMEM127, TP53*,TSC1, TSC2, VHL and XRCC2 (sequencing and deletion/duplication); EGFR, EGLN1, HOXB13, KIT, MITF, PDGFRA, POLD1 and POLE (sequencing only); EPCAM and GREM1 (deletion/duplication only). DNA and RNA analyses performed for * genes.  Based on Ms. Tennant's personal and family history of cancer, she meets medical criteria for genetic testing. Despite that she meets criteria, she may still have an out of pocket cost. We discussed that if her out of pocket cost for testing is over $100, the laboratory will call and confirm whether she wants to proceed with testing.  If the out of pocket cost of testing is less than $100 she will be billed by the genetic testing laboratory.   We discussed that some people do not want to undergo genetic testing due to fear of genetic discrimination.  A federal law called the Genetic Information Non-Discrimination Act (GINA) of 2008 helps protect individuals against genetic discrimination based on their genetic test results.  It impacts both health insurance and employment.  For health insurance, it protects against increased premiums, being kicked off insurance or being forced to take a test in order to be insured.  For employment it protects against hiring, firing and promoting decisions based on genetic test results.  Health status due to a cancer diagnosis is not protected under GINA.  This law does not protect life insurance, disability insurance, or other types of insurance.   PLAN: After considering the risks, benefits, and limitations, Ms. Byas provided informed consent to pursue genetic testing and the blood sample was sent to [facility] for analysis of the CustomNext Panel. Results should be available within approximately 2-3 weeks' time, at which point they will be disclosed by telephone to Ms. Poppen, as will any additional  recommendations warranted by these results. Ms. Blanda will receive a summary of her genetic counseling visit and  a copy of her results once available. This information will also be available in Epic.   Based on Ms. Yorks's family history, we recommended her cousin/cousin's daughter have genetic counseling and testing. Ms. Wurzel will let us know if we can be of any assistance in coordinating genetic counseling and/or testing for this family member.   Ms. Ishler questions were answered to her satisfaction today. Our contact information was provided should additional questions or concerns arise. Thank you for the referral and allowing [place] to share in the care of your patient.   Faith Rogue, MS, [facility] Genetic Counselor [email or web address] Phone: [phone]  The patient was seen for a total of 30 minutes in face-to-face genetic counseling.  Drs. Magrinat, Lindi Adie and/or Burr Medico were available for discussion regarding this case.   _______________________________________________________________________ For Office Staff:  Number of people involved in session: 1 Was an Intern/ student involved with case: no

## 2019-10-19 LAB — GENETIC SCREENING ORDER

## 2019-10-27 NOTE — Progress Notes (Signed)
Start time: 11:28 End time:  11:49 Sound wasn't clear to patient, so changed to phone call.  Virtual Visit via Video Note  I connected with Rachel Juarez on 10/28/2019 by a video enabled telemedicine application and verified that I am speaking with the correct person using two identifiers.  Location: Patient: home Provider: office   I discussed the limitations of evaluation and management by telemedicine and the availability of in person appointments. The patient expressed understanding and agreed to proceed.  History of Present Illness:  Chief Complaint  Patient presents with  . Depression    follow up on depression.    Patient presents for 6 week follow-up on depression.  She has h/o underlying anxiety, but at her 10/29 visit reported significant depressive symptoms. She reported not getting any joy from anything anymore; there was a lot of tension and marital stress (related to the different outlook she and her husband have related to the coronavirus). She hadn't been sleeping well, trouble falling asleep, had some decreased energy, was taking naps a few days/week.  She reported not being able to shut her mind down.    Screening tests done last visit: PHQ-9 score of 13 GAD-7 score of 13  Wellbutrin XL 150mg  was added to her citalopram, and she was encouraged to start counseling.  Today she reports her mood has "lifted", after about a week on the wellbutrin. She can now engage in conversation with her husband.  Scheduled for her first therapy appointment on Saturday. Some days she feels more lethargic, but overall mood and energy has been better, able to "handle life" better. Stressors have decreased slightly, with vaccine more on the horizon (and now post-election) Still has some trouble falling asleep. She tried melatonin a couple of nights, didn't help. She denies any side effects.  Patient underwent genetic testing recently, results are pending.  PMH, PSH, SH  reviewed  Outpatient Encounter Medications as of 10/28/2019  Medication Sig Note  . aspirin 81 MG tablet Take 81 mg by mouth daily.   Marland Kitchen buPROPion (WELLBUTRIN XL) 150 MG 24 hr tablet Take 1 tablet (150 mg total) by mouth daily.   . Calcium 250 MG CAPS Take 2 capsules by mouth daily.   . Cholecalciferol (VITAMIN D3) LIQD Take 2,000 Int'l Units by mouth daily.   . citalopram (CELEXA) 40 MG tablet TAKE 1 TABLET BY MOUTH EVERY DAY   . fexofenadine (ALLEGRA) 180 MG tablet Take 180 mg by mouth daily.   Marland Kitchen levothyroxine (SYNTHROID, LEVOTHROID) 75 MCG tablet Take 75 mcg by mouth daily before breakfast.   . Magnesium 500 MG TABS Take 1 tablet by mouth daily.   Marland Kitchen MILK THISTLE PO Take 1 capsule by mouth daily.   . Misc Natural Products (TART CHERRY ADVANCED PO) Take 1 capsule by mouth daily.   . montelukast (SINGULAIR) 10 MG tablet TAKE 1 TABLET BY MOUTH EVERYDAY AT BEDTIME   . Multiple Vitamins-Minerals (MULTIVITAMIN WITH MINERALS) tablet Take 1 tablet by mouth daily.   . Omega-3 Fatty Acids (FISH OIL) 1000 MG CPDR Take 2,000 mg by mouth daily.   Marland Kitchen PHYTOSTEROL ESTERS PO Take 2 tablets by mouth daily. 08/30/2015: Plant sterol for cholesterol.  Takes 2/day (supposed to take 4)  . Triamcinolone Acetonide (NASACORT AQ NA) Place 2 sprays into the nose daily.   . [DISCONTINUED] buPROPion (WELLBUTRIN XL) 150 MG 24 hr tablet Take 1 tablet (150 mg total) by mouth daily.   . [DISCONTINUED] triamcinolone cream (KENALOG) 0.5 % Apply 1 application  topically 2 (two) times daily. To affected areas.   . ALPRAZolam (XANAX) 0.5 MG tablet Take 0.5-1 tablets (0.25-0.5 mg total) by mouth 3 (three) times daily as needed for anxiety or sleep. (Patient not taking: Reported on 10/28/2019)   . valACYclovir (VALTREX) 1000 MG tablet Take 2 tablets at onset of cold sore. Repeat once in 12 hours (4 pills/course) (Patient not taking: Reported on 10/28/2019)   . [DISCONTINUED] gabapentin (NEURONTIN) 100 MG capsule TAKE 2 CAPSULES (200 MG  TOTAL) BY MOUTH AT BEDTIME.   . [DISCONTINUED] predniSONE (DELTASONE) 50 MG tablet Take 1 tablet (50 mg total) by mouth daily.   . [DISCONTINUED] Vitamin D, Ergocalciferol, (DRISDOL) 1.25 MG (50000 UT) CAPS capsule TAKE 1 CAPSULE (50,000 UNITS TOTAL) BY MOUTH EVERY 7 (SEVEN) DAYS.    No facility-administered encounter medications on file as of 10/28/2019.    Allergies  Allergen Reactions  . Demerol [Meperidine] Other (See Comments)    Low bp, passes out    ROS:  No fever, chills, URI symptoms.  No headaches, GI complaints. Insomnia per HPI.  Depression and anxiety, per HPI, improving.    Observations/Objective:  BP 115/81   Pulse 70   Ht 5\' 6"  (1.676 m)   Wt 165 lb (74.8 kg)   LMP 11/18/2008   BMI 26.63 kg/m   Wt Readings from Last 3 Encounters:  10/28/19 165 lb (74.8 kg)  02/01/19 170 lb (77.1 kg)  01/25/19 167 lb (75.8 kg)   Pleasant, well-appearing female, in good spirits. She is alert, oriented. Normal eye contact, speech.  Cranial nerves are grossly intact. Normal grooming Full range of affect, normal mood.  PHQ-9 score of 6, down from 13 GAD-7 score of 3, down from 13   Assessment and Plan:  Depression, major, single episode, moderate (HCC) - improved since wellbutrin added to citalopram. continue both, and continue with counseling as planned - Plan: buPROPion (WELLBUTRIN XL) 150 MG 24 hr tablet  Generalized anxiety disorder - improved. Continue wellbutrin, citalopram, and with counseling as planned  Insomnia, unspecified type - discussed diphenhydramine prn. Counseling may help also. consider other meds if not improving  F/u as scheduled in March, sooner prn.    Follow Up Instructions:    I discussed the assessment and treatment plan with the patient. The patient was provided an opportunity to ask questions and all were answered. The patient agreed with the plan and demonstrated an understanding of the instructions.   The patient was advised to call back  or seek an in-person evaluation if the symptoms worsen or if the condition fails to improve as anticipated.  I provided 23 minutes of non-face-to-face time during this encounter.   Vikki Ports, MD

## 2019-10-28 ENCOUNTER — Encounter: Payer: Self-pay | Admitting: Family Medicine

## 2019-10-28 ENCOUNTER — Other Ambulatory Visit: Payer: Self-pay

## 2019-10-28 ENCOUNTER — Ambulatory Visit (INDEPENDENT_AMBULATORY_CARE_PROVIDER_SITE_OTHER): Payer: 59 | Admitting: Family Medicine

## 2019-10-28 VITALS — BP 115/81 | HR 70 | Ht 66.0 in | Wt 165.0 lb

## 2019-10-28 DIAGNOSIS — G4700 Insomnia, unspecified: Secondary | ICD-10-CM | POA: Diagnosis not present

## 2019-10-28 DIAGNOSIS — F321 Major depressive disorder, single episode, moderate: Secondary | ICD-10-CM | POA: Diagnosis not present

## 2019-10-28 DIAGNOSIS — F411 Generalized anxiety disorder: Secondary | ICD-10-CM

## 2019-10-28 MED ORDER — BUPROPION HCL ER (XL) 150 MG PO TB24: 150.0000 mg | ORAL_TABLET | Freq: Every day | ORAL | 1 refills | Status: DC

## 2019-10-28 NOTE — Patient Instructions (Signed)
Continue both the citalopram and the wellbutrin XL. Continue with counseling, as is scheduled.  You can try taking diphenhydramine 25-50mg  at bedtime (this is benadryl) to see if it helps you sleep at all.  You may feel groggy in the morning if you take it too late at night (be sure to have at least 8 hours before you need to drive).  Let me know if there are any other problems or issues that arise, otherwise I'll see you in March.  Stay well!

## 2019-11-02 ENCOUNTER — Telehealth: Payer: Self-pay | Admitting: Licensed Clinical Social Worker

## 2019-11-03 ENCOUNTER — Ambulatory Visit: Payer: Self-pay | Admitting: Licensed Clinical Social Worker

## 2019-11-03 ENCOUNTER — Encounter: Payer: Self-pay | Admitting: Licensed Clinical Social Worker

## 2019-11-03 DIAGNOSIS — Z1379 Encounter for other screening for genetic and chromosomal anomalies: Secondary | ICD-10-CM

## 2019-11-03 DIAGNOSIS — Z808 Family history of malignant neoplasm of other organs or systems: Secondary | ICD-10-CM

## 2019-11-03 DIAGNOSIS — Z803 Family history of malignant neoplasm of breast: Secondary | ICD-10-CM

## 2019-11-03 NOTE — Progress Notes (Signed)
HPI:  Ms. Duren was previously seen in the [facility] due to a family history of cancer and concerns regarding a hereditary predisposition to cancer. Please refer to our prior cancer genetics clinic note for more information regarding our discussion, assessment and recommendations, at the time. Ms. Grumbine recent genetic test results were disclosed to her, as were recommendations warranted by these results. These results and recommendations are discussed in more detail below.  CANCER HISTORY:  Oncology History   No history exists.    FAMILY HISTORY:  We obtained a detailed, 4-generation family history.  Significant diagnoses are listed below: Family History  Problem Relation Age of Onset   Lupus Mother    Cancer Father 83       thymic cancer   COPD Father    Heart disease Father        CHF   Asthma Father    Cancer Sister    Breast cancer Sister 44       "best kind", lumpectomy and radiation   Dermatomyositis Daughter 36       juvenile form   Irritable bowel syndrome Daughter    Colon cancer Maternal Grandmother        80's   Cancer Maternal Grandmother    Hypertension Paternal Grandmother    Hyperlipidemia Paternal Grandmother    Thyroid disease Paternal Grandmother        had goiter removed   Stroke Paternal Grandmother 23   Hypertension Paternal Grandfather    Hyperlipidemia Paternal Grandfather    Heart disease Paternal Grandfather        MI first in his 39's   Stroke Paternal Grandfather    Cancer Paternal Uncle        started on tonsils (smoker)   Breast cancer Paternal Aunt 64   Breast cancer Cousin 50   Breast cancer Cousin 46       (the mother of the 32 y/o cousin with br ca   Thyroid cancer Cousin    Diabetes Neg Hx    Ovarian cancer Neg Hx    Ms. Renwick has 1 daughter, [name], who had not had cancer. Patient has 1 sister, [name], who was diagnosed with breast cancer at 37 and is currently 6.   Ms. Cech  mother died at 62 and had lupus. Patient has 2 maternal uncles, 2 maternal aunts, and 1 maternal half aunt and 1 maternal half uncle through grandmother. No known cancers in these aunts/uncles. One of her maternal first cousins was diagnosed with breast cancer recently at 69 and this cousin's daughter was diagnosed with breast cancer at 84. Neither have had genetic testing. Maternal grandmother had colon cancer in her 65s. Maternal grandfather died in an accident in his 47s.  Ms. Knack father was diagnosed with thymic cancer at 44 and died at 19, history of smoking. Patient had 2 paternal uncles, 3 paternal aunts. One of her uncles had tonsil cancer and died in his 86s. An aunt had breast cancer at 10 and is living in her 25s. A paternal cousin had thyroid cancer in her 13s. Paternal grandmother died at 64 and grandfather died in his 29s, no cancers.  Ms. Shukla is unaware of previous family history of genetic testing for hereditary cancer risks. Patient's maternal ancestors are of [place], [place] and [place] descent. There is no reported Ashkenazi Jewish ancestry. There is no known consanguinity.  GENETIC TEST RESULTS: Genetic testing reported out on 11/01/2019 through the [facility] CancerNext-Expanded+RNAinsight cancer panel  found no pathogenic mutations.  The CancerNext-Expanded + RNAinsight gene panel offered by [employer] and includes sequencing and rearrangement analysis for the following 77 genes: IP, ALK, APC*, ATM*, AXIN2, BAP1, BARD1, BLM, BMPR1A, BRCA1*, BRCA2*, BRIP1*, CDC73, CDH1*,CDK4, CDKN1B, CDKN2A, CHEK2*, CTNNA1, DICER1, FANCC, FH, FLCN, GALNT12, KIF1B, LZTR1, MAX, MEN1, MET, MLH1*, MSH2*, MSH3, MSH6*, MUTYH*, NBN, NF1*, NF2, NTHL1, PALB2*, PHOX2B, PMS2*, POT1, PRKAR1A, PTCH1, PTEN*, RAD51C*, RAD51D*,RB1, RECQL, RET, SDHA, SDHAF2, SDHB, SDHC, SDHD, SMAD4, SMARCA4, SMARCB1, SMARCE1, STK11, SUFU, TMEM127, TP53*,TSC1, TSC2, VHL and XRCC2 (sequencing and deletion/duplication); EGFR, EGLN1,  HOXB13, KIT, MITF, PDGFRA, POLD1 and POLE (sequencing only); EPCAM and GREM1 (deletion/duplication only). DNA and RNA analyses performed for * genes.  The test report has been scanned into EPIC and is located under the Molecular Pathology section of the Results Review tab.  A portion of the result report is included below for reference.     We discussed with Ms. Senseney that because current genetic testing is not perfect, it is possible there may be a gene mutation in one of these genes that current testing cannot detect, but that chance is small.  We also discussed, that there could be another gene that has not yet been discovered, or that we have not yet tested, that is responsible for the cancer diagnoses in the family. It is also possible there is a hereditary cause for the cancer in the family that Ms. Weirauch did not inherit and therefore was not identified in her testing.  Therefore, it is important to remain in touch with cancer genetics in the future so that we can continue to offer Ms. Araque the most up to date genetic testing.   ADDITIONAL GENETIC TESTING: We discussed with Ms. Klingensmith that her genetic testing was fairly extensive.  If there are genes identified to increase cancer risk that can be analyzed in the future, we would be happy to discuss and coordinate this testing at that time.    CANCER SCREENING RECOMMENDATIONS: Ms. Kendrick test result is considered negative (normal).  This means that we have not identified a hereditary cause for her family history of cancer at this time.   While reassuring, this does not definitively rule out a hereditary predisposition to cancer. It is still possible that there could be genetic mutations that are undetectable by current technology. There could be genetic mutations in genes that have not been tested or identified to increase cancer risk.  Therefore, it is recommended she continue to follow the cancer management and screening guidelines provided by  her primary healthcare provider.   An individual's cancer risk and medical management are not determined by genetic test results alone. Overall cancer risk assessment incorporates additional factors, including personal medical history, family history, and any available genetic information that may result in a personalized plan for cancer prevention and surveillance.   Based on Ms. Rapaport's personal and family history of cancer, as well as her genetic test results, a risk model, Harriett Rush, was used to estimate her risk of developing breast cancer. This estimates her lifetime risk of developing breast cancer to be approximately 24.5%.  The patient's lifetime breast cancer risk is a preliminary estimate based on available information using one of several models endorsed by the [place] (ACS). The ACS recommends consideration of breast MRI screening as an adjunct to mammography for patients at high risk (defined as 20% or greater lifetime risk).   .   Ms. Beavers has been determined to be at high risk  for breast cancer.  Therefore, we recommend annual screening with mammography and breast MRI. We discussed that Ms. Ohern should discuss her individual situation with her referring physician and determine a breast cancer screening plan with which they are both comfortable.  She was offered a referral to our high risk breast clinic to discuss management further, but would like to speak with her referring provider and PCP first.   [place]:  Relatives in this family might be at some increased risk of developing cancer, over the general population risk, simply due to the family history of cancer.  We recommended female relatives in this family have a yearly mammogram beginning at age 55, or 57 years younger than the earliest onset of cancer, an annual clinical breast exam, and perform monthly breast self-exams. Female relatives in this family should also have a gynecological  exam as recommended by their primary provider. All family members should have a colonoscopy by age 48, or as directed by their physicians.   It is also possible there is a hereditary cause for the cancer in Ms. Lubben's family that she did not inherit and therefore was not identified in her.  Based on Ms. Apodaca's family history, we recommended her maternal relatives, especially her cousin who had breast cancer at 45, have genetic counseling and testing. Ms. Dauphinee will let us know if we can be of any assistance in coordinating genetic counseling and/or testing for these family members.  FOLLOW-UP: Lastly, we discussed with Ms. Loughmiller that cancer genetics is a rapidly advancing field and it is possible that new genetic tests will be appropriate for her and/or her family members in the future. We encouraged her to remain in contact with cancer genetics on an annual basis so we can update her personal and family histories and let her know of advances in cancer genetics that may benefit this family.   Our contact number was provided. Ms. Stachowski questions were answered to her satisfaction, and she knows she is welcome to call us at anytime with additional questions or concerns.   Faith Rogue, MS, [facility] Genetic Counselor [email or web address] Phone: [phone]

## 2019-11-03 NOTE — Telephone Encounter (Signed)
Revealed negative genetic testing. This normal result is reassuring.  It is unlikely that there is an increased risk of cancer due to a mutation in one of these genes.  However, genetic testing is not perfect, and cannot definitively rule out a hereditary cause.  It will be important for her to keep in contact with genetics to learn if any additional testing may be needed in the future.  Based on her Harriett Rush score she is also considered high risk for breast cancer, she wanted to speak with her doctors about this and does not currently want a referral to the high risk clinic.

## 2019-11-15 ENCOUNTER — Other Ambulatory Visit: Payer: Self-pay | Admitting: Endocrinology

## 2019-11-15 DIAGNOSIS — E049 Nontoxic goiter, unspecified: Secondary | ICD-10-CM

## 2019-11-23 ENCOUNTER — Ambulatory Visit
Admission: RE | Admit: 2019-11-23 | Discharge: 2019-11-23 | Disposition: A | Discharge status: Home / Self Care | Payer: 59 | Source: Ambulatory Visit | Attending: Endocrinology | Admitting: Endocrinology

## 2019-11-23 DIAGNOSIS — E049 Nontoxic goiter, unspecified: Secondary | ICD-10-CM

## 2019-12-12 ENCOUNTER — Other Ambulatory Visit: Payer: Self-pay | Admitting: Family Medicine

## 2019-12-12 DIAGNOSIS — J302 Other seasonal allergic rhinitis: Secondary | ICD-10-CM

## 2020-01-19 ENCOUNTER — Other Ambulatory Visit: Payer: Self-pay

## 2020-01-19 ENCOUNTER — Other Ambulatory Visit: Payer: 59

## 2020-01-19 DIAGNOSIS — E7800 Pure hypercholesterolemia, unspecified: Secondary | ICD-10-CM

## 2020-01-19 DIAGNOSIS — Z0000 Encounter for general adult medical examination without abnormal findings: Secondary | ICD-10-CM

## 2020-01-19 DIAGNOSIS — E559 Vitamin D deficiency, unspecified: Secondary | ICD-10-CM

## 2020-01-20 ENCOUNTER — Encounter: Payer: Self-pay | Admitting: Family Medicine

## 2020-01-20 LAB — CBC WITH DIFFERENTIAL/PLATELET
Basophils Absolute: 0.1 10*3/uL (ref 0.0–0.2)
Basos: 1 %
EOS (ABSOLUTE): 0.2 10*3/uL (ref 0.0–0.4)
Eos: 5 %
Hematocrit: 41.3 % (ref 34.0–46.6)
Hemoglobin: 14.3 g/dL (ref 11.1–15.9)
Immature Grans (Abs): 0 10*3/uL (ref 0.0–0.1)
Immature Granulocytes: 0 %
Lymphocytes Absolute: 1.6 10*3/uL (ref 0.7–3.1)
Lymphs: 36 %
MCH: 33.6 pg — ABNORMAL HIGH (ref 26.6–33.0)
MCHC: 34.6 g/dL (ref 31.5–35.7)
MCV: 97 fL (ref 79–97)
Monocytes Absolute: 0.4 10*3/uL (ref 0.1–0.9)
Monocytes: 10 %
Neutrophils Absolute: 2 10*3/uL (ref 1.4–7.0)
Neutrophils: 48 %
Platelets: 246 10*3/uL (ref 150–450)
RBC: 4.26 x10E6/uL (ref 3.77–5.28)
RDW: 13.4 % (ref 11.7–15.4)
WBC: 4.3 10*3/uL (ref 3.4–10.8)

## 2020-01-20 LAB — COMPREHENSIVE METABOLIC PANEL
ALT: 33 IU/L — ABNORMAL HIGH (ref 0–32)
AST: 25 IU/L (ref 0–40)
Albumin/Globulin Ratio: 2.1 (ref 1.2–2.2)
Albumin: 4.6 g/dL (ref 3.8–4.8)
Alkaline Phosphatase: 89 IU/L (ref 39–117)
BUN/Creatinine Ratio: 15 (ref 12–28)
BUN: 13 mg/dL (ref 8–27)
Bilirubin Total: 0.4 mg/dL (ref 0.0–1.2)
CO2: 25 mmol/L (ref 20–29)
Calcium: 9.8 mg/dL (ref 8.7–10.3)
Chloride: 101 mmol/L (ref 96–106)
Creatinine, Ser: 0.89 mg/dL (ref 0.57–1.00)
GFR calc Af Amer: 80 mL/min/{1.73_m2} (ref >59)
GFR calc non Af Amer: 70 mL/min/{1.73_m2} (ref >59)
Globulin, Total: 2.2 g/dL (ref 1.5–4.5)
Glucose: 89 mg/dL (ref 65–99)
Potassium: 5.2 mmol/L (ref 3.5–5.2)
Sodium: 140 mmol/L (ref 134–144)
Total Protein: 6.8 g/dL (ref 6.0–8.5)

## 2020-01-20 LAB — LIPID PANEL
Chol/HDL Ratio: 3.3 ratio (ref 0.0–4.4)
Cholesterol, Total: 283 mg/dL — ABNORMAL HIGH (ref 100–199)
HDL: 87 mg/dL (ref >39)
LDL Chol Calc (NIH): 184 mg/dL — ABNORMAL HIGH (ref 0–99)
Triglycerides: 78 mg/dL (ref 0–149)
VLDL Cholesterol Cal: 12 mg/dL (ref 5–40)

## 2020-01-20 LAB — VITAMIN D 25 HYDROXY (VIT D DEFICIENCY, FRACTURES): Vit D, 25-Hydroxy: 126 ng/mL — ABNORMAL HIGH (ref 30.0–100.0)

## 2020-01-25 NOTE — Patient Instructions (Addendum)
  HEALTH MAINTENANCE RECOMMENDATIONS:  It is recommended that you get at least 30 minutes of aerobic exercise at least 5 days/week (for weight loss, you may need as much as 60-90 minutes). This can be any activity that gets your heart rate up. This can be divided in 10-15 minute intervals if needed, but try and build up your endurance at least once a week.  Weight bearing exercise is also recommended twice weekly.  Eat a healthy diet with lots of vegetables, fruits and fiber.  "Colorful" foods have a lot of vitamins (ie green vegetables, tomatoes, red peppers, etc).  Limit sweet tea, regular sodas and alcoholic beverages, all of which has a lot of calories and sugar.  Up to 1 alcoholic drink daily may be beneficial for women (unless trying to lose weight, watch sugars).  Drink a lot of water.  Calcium recommendations are 1200-1500 mg daily (1500 mg for postmenopausal women or women without ovaries), and vitamin D 1000 IU daily.  This should be obtained from diet and/or supplements (vitamins), and calcium should not be taken all at once, but in divided doses.  Monthly self breast exams and yearly mammograms for women over the age of 9 is recommended.  Sunscreen of at least SPF 30 should be used on all sun-exposed parts of the skin when outside between the hours of 10 am and 4 pm (not just when at beach or pool, but even with exercise, golf, tennis, and yard work!)  Use a sunscreen that says "broad spectrum" so it covers both UVA and UVB rays, and make sure to reapply every 1-2 hours.  Remember to change the batteries in your smoke detectors when changing your clock times in the spring and fall. Carbon monoxide detectors are recommended for your home.  Use your seat belt every time you are in a car, and please drive safely and not be distracted with cell phones and texting while driving.  Your vitamin D level was too high.  It appears that your dose of the liquid has been much higher than 12-3998 IU  daily (which would be just 1-2 drops) Continue to stay off for another week or two, and resume supplementation at a dose of 2000 IU daily.  Look at your box for the phytosterols and take the maximum recommended (?4/day). Work on getting calcium in your diet (and make up the difference with supplements).

## 2020-01-25 NOTE — Progress Notes (Signed)
Chief Complaint  Patient presents with  . Annual Exam    nonfasting annual exam with pelvic. No new concerns.     Rachel Juarez is a 64 y.o. female who presents for a complete physical.  She had labs done prior to her visit, see below.  Vitamin D level was noted to be elevated on labs done prior to her visit.  She has been taking 1/4 dropperful (which may be very high--each drop has 2000 IU--1/4 of dropper was at least 5 drops).  Her prior level was 36 in 12/2016.  She was seen in October 2020 with worsening depression, in addition to anxiety.  At that time PHQ-9 score was 13, GAD-7 score was 13. Wellbutrin XL 150mg  was added to her citalopram and at her f/u visit in December, she reported doing much better.  She felt like she could "handle life" better, overall mood and energy had improved.  Stressors had diminished (vaccine on the horizon, post-election). She was still having some trouble falling asleep.  She had scheduled her first therapy appt (hadn't started yet). Her PHQ-9 score had improved to 6, and GAD-7 was down to 3.  Today she reports some ongoing depression.  She and her husband have both been vaccinated, as well as her daughter.  But she is not feeling relieved. Worries about variants, not sure what is safe for her to do or not.  She really hasn't changed any behavior.  She has some issues with marriage, some of which is related to their different approaches to COVID; they are both getting therapy. She is seeing therapist q2 weeks, journaling Lavena Bullion, [facility]). Finds this very helpful. She is tolerating her medications without side effects, and denies needing refills. Alprazolam last refilled #15 in 07/2019. She uses prior to mammograms, MRI's, and with flying (which she hasn't done).  Hypothyroidism/multinodular goiter--under the care of Dr. Chalmers Cater.  She hasn't needed any med changes in years. No notesreceived from Dr. Chalmers Cater, sees her once yearly, believe she is due soon.   Denies any thyroid symptoms, compliant with medications. She had thyroid US in 10/2019 (ordered by Dr. Chalmers Cater): IMPRESSION: Mildly heterogeneous thyroid gland without evidence for a distinct thyroid nodule that meets criteria for FNA or follow-up ultrasound.  Herpes labialis--uses Valtrex prn. Last filled #40 in 01/2019, with 1 refil, which she just picked up. Getting cold sore about every 2-3 months, just got over one.  Allergies: They are year-round but also has seasonal component in spring and fall. She takes singulair daily, and nasal steroid (nasacort)and antihistamine (allegra).  Hyperlipidemia:She tries to take plant sterols and fish oil, and follow a low cholesterol diet. Her diet has changed over the last year due to the pandemic, and eating much more takeout.  Eating a lot more french fries than she ever did before.  She has red meat once a week.  She is eating more pasta and creamy things, eating more eggs and cheese, though not daily. She uses lowfat cheese.  Doesn't drink milk or eat yogurt. Uses lowfat mayonnaise. Has COVID fatigue, depressed, doesn't look forward to eating or cooking.  LDL had been in 140's-150 range in 2018-2019, went up to 173 in 2020. She previously reported taking 2/day of the phytosterol, thinks she could take them twice daily, but hasn't been.  Macrocytosis.previously hadB12 in the330 range. MCV was at 100, prev up to 104 per last records. Pt recalls seeing hematologist in the past. No work-up was needed. She denies symptoms of anemia,  denies numbness, tingling or memory concerns. She takes MVI daily.   Immunization History  Administered Date(s) Administered  . Influenza,inj,Quad PF,6+ Mos 08/30/2015  . Influenza-Unspecified 07/30/2017, 08/24/2018, 08/20/2019  . Moderna SARS-COVID-2 Vaccination 12/07/2019, 01/05/2020  . Tdap 02/12/2011  . Zoster Recombinat (Shingrix) 03/06/2018, 05/08/2018   Last Pap smear:12/2015, normal, no high risk HPV  detected Last mammogram: 01/2019.  She had bilateral MRI in 07/2019, and is due again for mammogram now (01/2020), and is scheduled for this. Last colonoscopy: 05/2018 Dr. Watt Climes, hyperplastic polyps. Repeat 10 years Last DEXA: 01/2018, T-1.7 at fem neck bilaterally [facility], ordered by Dr. Chalmers Cater). She just got notice she is due. Dentist: twice a year Ophtho: Dr. Katy Fitch, yearly [place] trainer 2x/week, weights and HIIT, brisk walking once a week (3 miles), plus dog walks (frequent stops);Yoga 2x/week (used to do daily, lost interest in).  Hepatitis C screening--previously donated bloodregularly   PMH, PSH, SH and FH were reviewed and updated Negative genetic testing 10/2019  Outpatient Encounter Medications as of 01/26/2020  Medication Sig Note  . aspirin 81 MG tablet Take 81 mg by mouth daily.   Marland Kitchen buPROPion (WELLBUTRIN XL) 150 MG 24 hr tablet Take 1 tablet (150 mg total) by mouth daily.   . Calcium 250 MG CAPS Take 2 capsules by mouth daily. 01/26/2020: Takes 1 calcium tablet daily (unsure of dose)  . Cholecalciferol (VITAMIN D3) LIQD Take 2,000 Int'l Units by mouth daily. 01/26/2020: Using quarter of a dropperful (which is a quite a few drops, at 2000 IU per drop)  . citalopram (CELEXA) 40 MG tablet TAKE 1 TABLET BY MOUTH EVERY DAY   . fexofenadine (ALLEGRA) 180 MG tablet Take 180 mg by mouth daily.   Marland Kitchen levothyroxine (SYNTHROID, LEVOTHROID) 75 MCG tablet Take 75 mcg by mouth daily before breakfast.   . Magnesium 500 MG TABS Take 1 tablet by mouth daily.   Marland Kitchen MILK THISTLE PO Take 1 capsule by mouth daily.   . Misc Natural Products (TART CHERRY ADVANCED PO) Take 1 capsule by mouth daily.   . montelukast (SINGULAIR) 10 MG tablet TAKE 1 TABLET BY MOUTH EVERYDAY AT BEDTIME   . Multiple Vitamins-Minerals (MULTIVITAMIN WITH MINERALS) tablet Take 1 tablet by mouth daily.   . NON FORMULARY Take 2 capsules by mouth daily. 01/26/2020: DoTerra On Guard  . NON FORMULARY Take 2 capsules by mouth daily.  01/26/2020: DoTerra Zendocrine  . Omega-3 Fatty Acids (FISH OIL) 1000 MG CPDR Take 2,000 mg by mouth daily.   Marland Kitchen PHYTOSTEROL ESTERS PO Take 2 tablets by mouth daily. 08/30/2015: Plant sterol for cholesterol.  Takes 2/day (supposed to take 4)  . Triamcinolone Acetonide (NASACORT AQ NA) Place 2 sprays into the nose daily.   . valACYclovir (VALTREX) 1000 MG tablet Take 2 tablets at onset of cold sore. Repeat once in 12 hours (4 pills/course)   . ALPRAZolam (XANAX) 0.5 MG tablet Take 0.5-1 tablets (0.25-0.5 mg total) by mouth 3 (three) times daily as needed for anxiety or sleep. (Patient not taking: Reported on 01/26/2020) 01/26/2020: Uses with mammograms, breast MRI's and with flying   No facility-administered encounter medications on file as of 01/26/2020.    ROS: The patient denies, fever, weight changes, headaches, vision changes, decreased hearing, ear pain, sore throat, breast concerns, chest pain, palpitations, dizziness, syncope, dyspnea on exertion, cough, swelling, nausea, vomiting, diarrhea, constipation, abdominal pain, melena, hematochezia, indigestion/heartburn, hematuria, incontinence, dysuria, vaginal bleeding, discharge, odor or itch, genital lesions, joint pains, numbness, tingling, weakness, tremor, suspicious skin lesions, depression,  anxiety, abnormal bleeding/bruising, or enlarged lymph nodes. Pain with intercourse, vaginal dryness; lubricants help. No hot flashes (rare, mild) Some pain and triggering in a finger. Tart cherry helps with pain. occ L shoulder pain (saw Dr. Charlann Boxer) Allergies, controlled Some decrease in appetite, decreased interest in food. Depression.  See HPI. Anxiety is better.   PHYSICAL EXAM:  BP 110/68   Pulse 84   Temp 97.8 F (36.6 C) (Other (Comment))   Ht 5\' 6"  (1.676 m)   Wt 169 lb (76.7 kg)   LMP 11/18/2008   BMI 27.28 kg/m    Wt Readings from Last 3 Encounters:  01/26/20 169 lb (76.7 kg)  10/28/19 165 lb (74.8 kg)  02/01/19 170 lb (77.1 kg)      General Appearance:  Alert, cooperative, no distress, appears stated age.   Head:  Normocephalic, without obvious abnormality, atraumatic  Eyes:  PERRL, conjunctiva/corneas clear, EOM's intact, fundi benign  Ears:    Impacted cerumen bilaterally, obstructing view of TMs.  R was viewed after lavage, normal TM  Nose: Not examined, wearing mask due to COVID-19 pandemic  Throat: Not examined, wearing mask due to COVID-19 pandemic  Neck: Supple, no lymphadenopathy; thyroid:mildly enlarged, nodular, more prominent on the right;no carotid bruit or JVD  Back:  Spine nontender, no curvature, ROM normal, no CVA tenderness  Lungs:  Clear to auscultation bilaterally without wheezes, rales or ronchi; respirations unlabored  Chest Wall:  No tenderness or deformity  Heart:  Regular rate and rhythm, S1 and S2 normal, no murmur, rub or gallop  Breast Exam:  No dimpling, nipple discharge or inversion. No axillary lymphadenopathy. She has some fibroglandular changes bilaterally, more prominent on the left, in both upper outer quadrants, slightly tender.   Abdomen:  Soft, non-tender, nondistended, normoactive bowel sounds,  no masses, no hepatosplenomegaly  Genitalia:  Normal external genitalia without lesions; mild atrophic changes. BUS and vagina normal;nocervical motion tenderness. No abnormal vaginal discharge. Uterus and adnexa not enlarged, nontender, no masses.Papnot performed  Rectal:  Normal tone, no masses or tenderness; guaiac negative stool  Extremities: No clubbing, cyanosis or edema  Pulses: 2+ and symmetric all extremities  Skin: Skin color, texture, turgor normal, no rashes or lesions  Lymph nodes: Cervical, supraclavicular, and axillary nodes normal  Neurologic: Normal strength, sensation and gait; reflexes 2+ and symmetric throughout       Psych: Normal mood, affect, hygiene and grooming  PHQ-9 score of  11    Chemistry      Component Value Date/Time   NA 140 01/19/2020 0811   K 5.2 01/19/2020 0811   CL 101 01/19/2020 0811   CO2 25 01/19/2020 0811   BUN 13 01/19/2020 0811   CREATININE 0.89 01/19/2020 0811   CREATININE 0.81 01/13/2017 0830      Component Value Date/Time   CALCIUM 9.8 01/19/2020 0811   ALKPHOS 89 01/19/2020 0811   AST 25 01/19/2020 0811   ALT 33 (H) 01/19/2020 0811   BILITOT 0.4 01/19/2020 0811     Fasting glucose 89  Vitamin D-OH elevated at 126.0  Lab Results  Component Value Date   WBC 4.3 01/19/2020   HGB 14.3 01/19/2020   HCT 41.3 01/19/2020   MCV 97 01/19/2020   PLT 246 01/19/2020   Lab Results  Component Value Date   CHOL 283 (H) 01/19/2020   HDL 87 01/19/2020   LDLCALC 184 (H) 01/19/2020   TRIG 78 01/19/2020   CHOLHDL 3.3 01/19/2020    ASSESSMENT/PLAN:  Annual physical  exam  Pure hypercholesterolemia - ASCVD is low risk.  counseled re: low cholesterol diet in detail; increase phytosterols. recheck 6 mos - Plan: Lipid panel  Depression, major, single episode, moderate ([place]) - Counseled in detail regarding COVID, what she can safely do. Cont current meds and counseling  Generalized anxiety disorder - stable on current regimen  Insomnia, unspecified type  Multinodular goiter - stable; under care of Dr. Chalmers Cater, recent US reviewed  Herpes labialis - cont Valtrex prn. Has plenty, just got refill  High vitamin D level - counseled re: dosing rec (2000 IU daily, max of 4000--she is getting much more).  Hold for an additional 1-2 wks  - Plan: VITAMIN D 25 Hydroxy (Vit-D Deficiency, Fractures)  Bilateral impacted cerumen - treated successfully with lavage   6 mos--OV with labs prior, chol, D  Mammogram due, scheduled.  Discussed monthly self breast exams and yearly mammograms; at least 30 minutes of aerobic activity at least 5 days/week, weight-bearing exercise at least 2x/wk; proper sunscreen use reviewed; healthy diet, including goals of  calcium and vitamin D intake and alcohol recommendations (less than or equal to 1 drink/day) reviewed; regular seatbelt use; changing batteries in smoke detectors. Immunization recommendations discussed, UTD.Continue yearly flu shots.Prevnar-13 at age 75. Colonoscopy UTD, due again 05/2028 (can consider Cologuard at that time instead)   COVID and depression counseling.counseling, above and beyond routine HM Counseled extensively regarding diet, cholesterol. Counseled regarding Ca intake (inadequate) and supplements.  65 min FTF, lots of counseling, plus additional time spent in chart review and documentation.

## 2020-01-26 ENCOUNTER — Encounter: Payer: Self-pay | Admitting: Family Medicine

## 2020-01-26 ENCOUNTER — Ambulatory Visit (INDEPENDENT_AMBULATORY_CARE_PROVIDER_SITE_OTHER): Payer: 59 | Admitting: Family Medicine

## 2020-01-26 ENCOUNTER — Other Ambulatory Visit: Payer: Self-pay

## 2020-01-26 VITALS — BP 110/68 | HR 84 | Temp 97.8°F | Ht 66.0 in | Wt 169.0 lb

## 2020-01-26 DIAGNOSIS — B001 Herpesviral vesicular dermatitis: Secondary | ICD-10-CM

## 2020-01-26 DIAGNOSIS — H6123 Impacted cerumen, bilateral: Secondary | ICD-10-CM | POA: Diagnosis not present

## 2020-01-26 DIAGNOSIS — E7800 Pure hypercholesterolemia, unspecified: Secondary | ICD-10-CM

## 2020-01-26 DIAGNOSIS — G4700 Insomnia, unspecified: Secondary | ICD-10-CM

## 2020-01-26 DIAGNOSIS — F321 Major depressive disorder, single episode, moderate: Secondary | ICD-10-CM

## 2020-01-26 DIAGNOSIS — E673 Hypervitaminosis D: Secondary | ICD-10-CM

## 2020-01-26 DIAGNOSIS — Z0000 Encounter for general adult medical examination without abnormal findings: Secondary | ICD-10-CM

## 2020-01-26 DIAGNOSIS — F411 Generalized anxiety disorder: Secondary | ICD-10-CM | POA: Diagnosis not present

## 2020-01-26 DIAGNOSIS — E042 Nontoxic multinodular goiter: Secondary | ICD-10-CM

## 2020-02-08 LAB — HM MAMMOGRAPHY

## 2020-02-09 ENCOUNTER — Encounter: Payer: Self-pay | Admitting: *Deleted

## 2020-03-01 ENCOUNTER — Encounter: Payer: Self-pay | Admitting: Family Medicine

## 2020-03-11 ENCOUNTER — Other Ambulatory Visit: Payer: Self-pay | Admitting: Family Medicine

## 2020-03-11 DIAGNOSIS — J302 Other seasonal allergic rhinitis: Secondary | ICD-10-CM

## 2020-03-29 ENCOUNTER — Other Ambulatory Visit: Payer: Self-pay | Admitting: Family Medicine

## 2020-03-29 DIAGNOSIS — F325 Major depressive disorder, single episode, in full remission: Secondary | ICD-10-CM

## 2020-03-29 DIAGNOSIS — F411 Generalized anxiety disorder: Secondary | ICD-10-CM

## 2020-05-11 ENCOUNTER — Other Ambulatory Visit: Payer: Self-pay | Admitting: Family Medicine

## 2020-05-11 DIAGNOSIS — F321 Major depressive disorder, single episode, moderate: Secondary | ICD-10-CM

## 2020-06-01 ENCOUNTER — Other Ambulatory Visit: Payer: Self-pay | Admitting: Family Medicine

## 2020-06-01 DIAGNOSIS — J302 Other seasonal allergic rhinitis: Secondary | ICD-10-CM

## 2020-06-01 DIAGNOSIS — F321 Major depressive disorder, single episode, moderate: Secondary | ICD-10-CM

## 2020-06-01 DIAGNOSIS — F325 Major depressive disorder, single episode, in full remission: Secondary | ICD-10-CM

## 2020-06-01 DIAGNOSIS — F411 Generalized anxiety disorder: Secondary | ICD-10-CM

## 2020-07-26 ENCOUNTER — Other Ambulatory Visit: Payer: Self-pay

## 2020-07-26 ENCOUNTER — Other Ambulatory Visit: Payer: 59

## 2020-07-26 DIAGNOSIS — E7800 Pure hypercholesterolemia, unspecified: Secondary | ICD-10-CM

## 2020-07-26 DIAGNOSIS — E673 Hypervitaminosis D: Secondary | ICD-10-CM

## 2020-07-27 LAB — LIPID PANEL
Chol/HDL Ratio: 3.3 ratio (ref 0.0–4.4)
Cholesterol, Total: 284 mg/dL — ABNORMAL HIGH (ref 100–199)
HDL: 86 mg/dL (ref >39)
LDL Chol Calc (NIH): 185 mg/dL — ABNORMAL HIGH (ref 0–99)
Triglycerides: 81 mg/dL (ref 0–149)
VLDL Cholesterol Cal: 13 mg/dL (ref 5–40)

## 2020-07-27 LAB — VITAMIN D 25 HYDROXY (VIT D DEFICIENCY, FRACTURES): Vit D, 25-Hydroxy: 44.5 ng/mL (ref 30.0–100.0)

## 2020-07-31 NOTE — Progress Notes (Signed)
Start time: 3:21 End time: 4:04     Virtual Visit via Video Note  I connected with Rachel Juarez on 07/31/20 at  2:45 PM EDT by MyChart video application and verified that I am speaking with the correct person using two identifiers.  Location: Patient: home Provider: office   I discussed the limitations, risks, security and privacy concerns of performing an evaluation and management service by telephone and the availability of in person appointments. I also discussed with the patient that there may be a patient responsible charge related to this service. The patient expressed understanding and agreed to proceed.   History of Present Illness:  Chief Complaint  Patient presents with  . Depression    VIRTUAL med check. No new concerns.   . Depression screening    3   Patient presents for 6 month med check. She had labs done prior to her visit.  Vitamin D deficiency--level was 126 six months ago.  She was asked to reduce her Vitamin D supplementation. She had been taking too many drops. She is currently taking MVI daily only, no longer taking the drops, stopped in 12/2019. Level last week was down to 44.5.  Depression:  At her last visit, PHQ-9 score was 11. Med changes weren't made, to continue with counseling. She continues on citalopram 40mg  and Wellbutrin XL 150mg  daily (Wellbutrin was added in 08/2019). Much of her current stress/anxiety is related to COVID, variants (back in October there were other stressors as well). Getting counseling every other week. June--felt so good like she could decrease medications, was doing well, but now she is "freaked out" about the delta variant--not as bad as in October.  "Coexisting" with husband fine. She is out doing things, went out with friends to eat outside. She is doing it, isn't sure that she is truly enjoying it.  She feels like she has nothing to look forward to--has tickets for [employer], but won't go. 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  Makes herself exercise, making herself do laundry, pay bills, has dinner with friends. Doesn't cry, feels flat.  Hyperlipidemia: At prior visits she reported eating more takeout, more french fries, more pasta, creamy things, more eggs, and had red meat once a week. She used lowfat cheese.   Today she reports that she continues to eat a lot of french fries, now is eating regular cheese, but fewer eggs. "I haven't been trying hard--I get down and I eat cheese." She increased her phytosterols to 2 capsules with meals, taking 4-6/day (was only taking 2/day at last check).  LDL had been in 140's-150 range in 2018-2019, went up to 173 in 2020, and higher on last check.  She has never taken a statin, but everyone she knows hates them. Though she then reported that her husband is taking one and has no issues.  PMH, PSH, SH reviewed  Outpatient Encounter Medications as of 08/02/2020  Medication Sig Note  . aspirin 81 MG tablet Take 81 mg by mouth daily.   Marland Kitchen buPROPion (WELLBUTRIN XL) 150 MG 24 hr tablet TAKE 1 TABLET BY MOUTH EVERY DAY   . Calcium 250 MG CAPS Take 2 capsules by mouth daily. 01/26/2020: Takes 1 calcium tablet daily (unsure of dose)  . Cholecalciferol (VITAMIN D3) LIQD Take 2,000 Int'l Units by mouth daily. 08/02/2020: Stopped taking this in 12/2019, only taking MVI  . citalopram (CELEXA) 40 MG tablet TAKE 1 TABLET BY MOUTH EVERY DAY   . fexofenadine (ALLEGRA) 180  MG tablet Take 180 mg by mouth daily.   Marland Kitchen levothyroxine (SYNTHROID, LEVOTHROID) 75 MCG tablet Take 75 mcg by mouth daily before breakfast.   . Magnesium 500 MG TABS Take 1 tablet by mouth daily.   Marland Kitchen MILK THISTLE PO Take 1 capsule by mouth daily.   . Misc Natural Products (TART CHERRY ADVANCED PO) Take 1 capsule by mouth daily.   . montelukast (SINGULAIR) 10 MG tablet TAKE 1 TABLET BY MOUTH EVERYDAY AT BEDTIME   . Multiple Vitamins-Minerals (MULTIVITAMIN WITH MINERALS) tablet Take 1 tablet by mouth  daily.   . NON FORMULARY Take 2 capsules by mouth daily. 01/26/2020: DoTerra On Guard  . NON FORMULARY Take 2 capsules by mouth daily. 01/26/2020: DoTerra Zendocrine  . Omega-3 Fatty Acids (FISH OIL) 1000 MG CPDR Take 2,000 mg by mouth daily.   Marland Kitchen PHYTOSTEROL ESTERS PO Take 2 tablets by mouth daily. 08/02/2020: 2 capsules with meals (4-6/day)  . Triamcinolone Acetonide (NASACORT AQ NA) Place 2 sprays into the nose daily.   Marland Kitchen ALPRAZolam (XANAX) 0.5 MG tablet Take 0.5-1 tablets (0.25-0.5 mg total) by mouth 3 (three) times daily as needed for anxiety or sleep. (Patient not taking: Reported on 01/26/2020) 01/26/2020: Uses with mammograms, breast MRI's and with flying  . valACYclovir (VALTREX) 1000 MG tablet Take 2 tablets at onset of cold sore. Repeat once in 12 hours (4 pills/course) (Patient not taking: Reported on 08/02/2020)    No facility-administered encounter medications on file as of 08/02/2020.   Allergies  Allergen Reactions  . Demerol [Meperidine] Other (See Comments)    Low bp, passes out    ROS:  No fever, chills, URI symptoms, headaches, chest pain, GI complaints or other issues.  See HPI.  +anxiety, depression.    Observations/Objective: Marland Kitchen BP 120/75   Temp (!) 97.4 F (36.3 C) (Oral)   Ht 5\' 6"  (1.676 m)   Wt 162 lb (73.5 kg)   LMP 11/18/2008   BMI 26.15 kg/m   Wt Readings from Last 3 Encounters:  08/02/20 162 lb (73.5 kg)  01/26/20 169 lb (76.7 kg)  10/28/19 165 lb (74.8 kg)   Well-appearing, pleasant, overweight female, in no distress. She is alert, oriented, normal eye contact, normal speech. Anxious mood, full range of affect  PHQ-9 score of 5 GAD-7 score of 10   Lab Results  Component Value Date   CHOL 284 (H) 07/26/2020   HDL 86 07/26/2020   LDLCALC 185 (H) 07/26/2020   TRIG 81 07/26/2020   CHOLHDL 3.3 07/26/2020   Vitamin D-OH level of 44.5   Assessment and Plan:  Pure hypercholesterolemia - LDL remains above goal, despite taking more plant sterols.  discussed risks/SE of crestor, and agrees to try - Plan: rosuvastatin (CRESTOR) 10 MG tablet, Lipid panel, Hepatic function panel  Vitamin D deficiency - adequately replaced on current MVI, continue  Generalized anxiety disorder - cont citalopram and counseling.  Cont exercise, self-care. Declines change in meds - Plan: citalopram (CELEXA) 40 MG tablet  Depression, major, single episode, moderate ([facility]) - cont current meds and counseling. Declines changes at this time - Plan: buPROPion (WELLBUTRIN XL) 150 MG 24 hr tablet, citalopram (CELEXA) 40 MG tablet  Medication monitoring encounter - Plan: Lipid panel, Hepatic function panel  Fasting lab visit in 2 months CPE in 01/2021    Follow Up Instructions:    I discussed the assessment and treatment plan with the patient. The patient was provided an opportunity to ask questions and all were answered. The  patient agreed with the plan and demonstrated an understanding of the instructions.   The patient was advised to call back or seek an in-person evaluation if the symptoms worsen or if the condition fails to improve as anticipated.  I provided 43 minutes of video face-to-face time during this encounter. Additional time spent in chart review and documentation. More than 1/2 visit spent counseling.   Vikki Ports, MD

## 2020-08-02 ENCOUNTER — Encounter: Payer: Self-pay | Admitting: *Deleted

## 2020-08-02 ENCOUNTER — Encounter: Payer: Self-pay | Admitting: Family Medicine

## 2020-08-02 ENCOUNTER — Telehealth (INDEPENDENT_AMBULATORY_CARE_PROVIDER_SITE_OTHER): Payer: 59 | Admitting: Family Medicine

## 2020-08-02 VITALS — BP 120/75 | Temp 97.4°F | Ht 66.0 in | Wt 162.0 lb

## 2020-08-02 DIAGNOSIS — E7800 Pure hypercholesterolemia, unspecified: Secondary | ICD-10-CM | POA: Diagnosis not present

## 2020-08-02 DIAGNOSIS — F321 Major depressive disorder, single episode, moderate: Secondary | ICD-10-CM | POA: Diagnosis not present

## 2020-08-02 DIAGNOSIS — E559 Vitamin D deficiency, unspecified: Secondary | ICD-10-CM

## 2020-08-02 DIAGNOSIS — Z5181 Encounter for therapeutic drug level monitoring: Secondary | ICD-10-CM

## 2020-08-02 DIAGNOSIS — F411 Generalized anxiety disorder: Secondary | ICD-10-CM | POA: Diagnosis not present

## 2020-08-02 MED ORDER — CITALOPRAM HYDROBROMIDE 40 MG PO TABS: 40.0000 mg | ORAL_TABLET | Freq: Every day | ORAL | 1 refills | Status: DC

## 2020-08-02 MED ORDER — ROSUVASTATIN CALCIUM 10 MG PO TABS: 10.0000 mg | ORAL_TABLET | Freq: Every day | ORAL | 1 refills | Status: DC

## 2020-08-02 MED ORDER — BUPROPION HCL ER (XL) 150 MG PO TB24: 150.0000 mg | ORAL_TABLET | Freq: Every day | ORAL | 1 refills | Status: DC

## 2020-08-02 NOTE — Patient Instructions (Addendum)
Start taking rosuvastatin (Crestor) 10mg  once daily. If you develop any muscle aches, you can start taking the supplement CoEnzyme Q10 along with it. This often helps prevent the muscle aches related to statin use.  If you have symptoms that do not improve with the coenzyme Q10, contact me and we can give you an alternate way to take the Crestor (we can start out at once a week, and if tolerating, try increasing it to 3x/week, versus cutting back from daily use to using it every other day.)  Continue to try and follow a low cholesterol diet as best you can.  The medication will lower the cholesterol and reduce your risk for cardiovascular disease even if you don't do a great job with the diet!  Your vitamin D level was normal, continue the multivitamin.  Please be sure to get your flu shot this year (you can schedule a nurse visit and get it at our office if you'd be more comfortable with that than going to a pharmacy).  Return in 2 months for fasting lab visit (to recheck your cholesterol and liver tests).  Feel free to reach out by phone or MyChart with any questions/concerns you have.  You still have a fair amount of anxiety, and some depression.  We discussed continuing with therapy for now, and continuing your same medications.  If things escalate/worsen, we may need to add on medications vs refer you to a psychiatrist for medication management.  Try and continue to work on self-care, ensuring adequate sleep, exercise. Do what feels right for you with regard to anxiety and COVID risks.   The vaccine does offer some protection, at preventing serious hospitalization and death, but as we know, there are many breakthrough cases occurring. Continue to wear your mask and be safe. We will all get through this--hang in there!!

## 2020-08-08 ENCOUNTER — Encounter: Payer: Self-pay | Admitting: Family Medicine

## 2020-08-17 ENCOUNTER — Other Ambulatory Visit: Payer: 59

## 2020-08-21 ENCOUNTER — Other Ambulatory Visit (INDEPENDENT_AMBULATORY_CARE_PROVIDER_SITE_OTHER): Payer: 59

## 2020-08-21 ENCOUNTER — Other Ambulatory Visit: Payer: Self-pay

## 2020-08-21 DIAGNOSIS — Z23 Encounter for immunization: Secondary | ICD-10-CM | POA: Diagnosis not present

## 2020-08-22 ENCOUNTER — Other Ambulatory Visit: Payer: Self-pay | Admitting: Family Medicine

## 2020-08-22 ENCOUNTER — Encounter: Payer: Self-pay | Admitting: Family Medicine

## 2020-08-22 DIAGNOSIS — B001 Herpesviral vesicular dermatitis: Secondary | ICD-10-CM

## 2020-08-24 ENCOUNTER — Other Ambulatory Visit: Payer: Self-pay | Admitting: Family Medicine

## 2020-08-24 DIAGNOSIS — J302 Other seasonal allergic rhinitis: Secondary | ICD-10-CM

## 2020-08-25 ENCOUNTER — Other Ambulatory Visit: Payer: Self-pay | Admitting: Family Medicine

## 2020-08-25 DIAGNOSIS — E7800 Pure hypercholesterolemia, unspecified: Secondary | ICD-10-CM

## 2020-09-11 ENCOUNTER — Encounter: Payer: Self-pay | Admitting: Family Medicine

## 2020-09-11 ENCOUNTER — Ambulatory Visit (INDEPENDENT_AMBULATORY_CARE_PROVIDER_SITE_OTHER)
Admission: RE | Admit: 2020-09-11 | Discharge: 2020-09-11 | Disposition: A | Discharge status: Home / Self Care | Payer: 59 | Source: Ambulatory Visit | Attending: Family Medicine | Admitting: Family Medicine

## 2020-09-11 ENCOUNTER — Ambulatory Visit: Payer: Self-pay

## 2020-09-11 ENCOUNTER — Other Ambulatory Visit: Payer: Self-pay

## 2020-09-11 ENCOUNTER — Ambulatory Visit (INDEPENDENT_AMBULATORY_CARE_PROVIDER_SITE_OTHER): Payer: 59 | Admitting: Family Medicine

## 2020-09-11 VITALS — BP 122/80 | HR 95 | Ht 66.0 in | Wt 165.0 lb

## 2020-09-11 DIAGNOSIS — M25571 Pain in right ankle and joints of right foot: Secondary | ICD-10-CM | POA: Diagnosis not present

## 2020-09-11 DIAGNOSIS — S92144A Nondisplaced dome fracture of right talus, initial encounter for closed fracture: Secondary | ICD-10-CM

## 2020-09-11 DIAGNOSIS — S92143A Displaced dome fracture of unspecified talus, initial encounter for closed fracture: Secondary | ICD-10-CM | POA: Insufficient documentation

## 2020-09-11 NOTE — Progress Notes (Signed)
[place] Phone: [phone] Subjective:   Rachel Juarez, am serving as a scribe for Dr. Hulan Saas. This visit occurred during the SARS-CoV-2 public health emergency.  Safety protocols were in place, including screening questions prior to the visit, additional usage of staff PPE, and extensive cleaning of exam room while observing appropriate contact time as indicated for disinfecting solutions.   I'm seeing this patient by the request  of:  Rita Ohara, MD  CC: Right ankle injury  [id number]   02/01/2019 Partial tear noted.  Discussed icing regimen and home exercise.  Discussed which activities to do which wants to avoid.  Do not feel that patient would respond well to the nitroglycerin, and discussed icing regimen.  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  Responded well to the injection.  Follow-up again in 4 weeks   Update 09/11/2020 Rachel Juarez is a 64 y.o. female coming in with complaint of right foot pain. Patient states she feel down 2 stairs on Saturday. Pain over lateral aspect of right ankle. Has been icing and elevating to treat.  Patient is able to walk on it.  Pain is on the lateral aspect.  Fairly severe overall.  Also thinks she broke her 5th toe on left foot. Noticeable ecchymosis. Pain with standing.       Past Medical History:  Diagnosis Date  . Allergy   . Anxiety   . Depression, major, in remission ([place]) 2006   when daughter dx'd with autoimmune disorder  . Family history of breast cancer   . Family history of cancer tonsil   . Family history of thyroid cancer   . Generalized anxiety disorder    generalized, and related to mammograms and flying  . Glaucoma suspect    sees Dr. Katy Fitch, has all been okay, sees him qoyr  . Herpes labialis   . Hypercholesterolemia    high HDL, borderline LDL  .  Hypothyroidism    treated by Dr. Chalmers Cater  . Multinodular goiter    followed by Dr. Chalmers Cater; u/s q2 yr  . 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 Seasonal allergies    takes singulair year-round; worse in the Fall   Juarez past surgical history on file. Social History   Socioeconomic History  . Marital status: Married    Spouse name: Not on file  . Number of children: Not on file  . Years of education: Not on file  . Highest education level: Not on file  Occupational History  . Occupation: Risk manager  Tobacco Use  . Smoking status: Never Smoker  . Smokeless tobacco: Never Used  Vaping Use  . Vaping Use: Never used  Substance and Sexual Activity  . Alcohol use: Yes    Alcohol/week: 0.0 standard drinks    Comment: 1 vodka soda daily at 6pm (1 shot), 2 on weekends (7-10/wk)  . Drug use: Juarez  . Sexual activity: Yes    Partners: Male    Birth control/protection: Post-menopausal  Other Topics Concern  . Not on file  Social History Narrative   Pre-COVID, taught yoga at multiple places.    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   Lives with her husband, and she has a daughter--graduated from [employer], lives in [place],  in school at [employer], and will then be applying for nursing school  (comes home for her IVIG treatments still)   1 dog (Malti-poo)   Volunteers at the [place] [employer]:   . Difficulty of Paying Living Expenses: Not on file  Food Insecurity:   . Worried About Charity fundraiser in the Last Year: Not on file  . Ran Out of Food in the Last Year: Not on file  Transportation Needs:   . Lack of Transportation (Medical): Not on file  . Lack of Transportation (Non-Medical): Not on file  Physical Activity:   . Days of Exercise per Week: Not on file  . Minutes of Exercise per Session: Not on file  Stress:   . Feeling of Stress : Not on file  Social  Connections:   . Frequency of Communication with Friends and Family: Not on file  . Frequency of Social Gatherings with Friends and Family: Not on file  . Attends Religious Services: Not on file  . Active Member of Clubs or Organizations: Not on file  . Attends Archivist Meetings: Not on file  . Marital Status: Not on file   Allergies  Allergen Reactions  . Demerol [Meperidine] Other (See Comments)    Low bp, passes out   Family History  Problem Relation Age of Onset  . Lupus Mother   . Cancer Father 16       thymic cancer  . COPD Father   . Heart disease Father        CHF  . Asthma Father   . Cancer Sister   . Breast cancer Sister 28       "best kind", lumpectomy and radiation  . Dermatomyositis Daughter 50       juvenile form  . Irritable bowel syndrome Daughter   . Colon cancer Maternal Grandmother        80's  . Cancer Maternal Grandmother   . Hypertension Paternal Grandmother   . Hyperlipidemia Paternal Grandmother   . Thyroid disease Paternal Grandmother        had goiter removed  . Stroke Paternal Grandmother 81  . Hypertension Paternal Grandfather   . Hyperlipidemia Paternal Grandfather   . Heart disease Paternal Grandfather        MI first in his 41's  . Stroke Paternal Grandfather   . Cancer Paternal Uncle        started on tonsils (smoker)  . Breast cancer Paternal Aunt 50  . Breast cancer Cousin 75  . Breast cancer Cousin 46       (the mother of the 34 y/o cousin with br ca  . Thyroid cancer Cousin   . Diabetes Neg Hx   . Ovarian cancer Neg Hx     Current Outpatient Medications (Endocrine & Metabolic):  .  levothyroxine (SYNTHROID, LEVOTHROID) 75 MCG tablet, Take 75 mcg by mouth daily before breakfast.  Current Outpatient Medications (Cardiovascular):  .  rosuvastatin (CRESTOR) 10 MG tablet, Take 1 tablet (10 mg total) by mouth daily.  Current Outpatient Medications (Respiratory):  .  fexofenadine (ALLEGRA) 180 MG tablet, Take 180 mg  by mouth daily. .  montelukast (SINGULAIR) 10 MG tablet, TAKE 1 TABLET BY MOUTH EVERYDAY AT BEDTIME .  Triamcinolone Acetonide (NASACORT AQ NA), Place 2 sprays into the nose daily.  Current Outpatient Medications (Analgesics):  .  aspirin 81 MG tablet, Take 81 mg by mouth daily.  Current Outpatient Medications (Other):  Marland Kitchen  ALPRAZolam (XANAX) 0.5 MG tablet, Take 0.5-1 tablets (0.25-0.5 mg total) by mouth 3 (three) times daily as needed for anxiety or sleep. Marland Kitchen  buPROPion (WELLBUTRIN XL) 150 MG 24 hr tablet, Take 1 tablet (150 mg total) by mouth daily. .  Calcium 250 MG CAPS, Take 2 capsules by mouth daily. .  Cholecalciferol (VITAMIN D3) LIQD, Take 2,000 Int'l Units by mouth daily.  .  citalopram (CELEXA) 40 MG tablet, Take 1 tablet (40 mg total) by mouth daily. .  Magnesium 500 MG TABS, Take 1 tablet by mouth daily. Marland Kitchen  MILK THISTLE PO, Take 1 capsule by mouth daily. .  Misc Natural Products (TART CHERRY ADVANCED PO), Take 1 capsule by mouth daily. .  Multiple Vitamins-Minerals (MULTIVITAMIN WITH MINERALS) tablet, Take 1 tablet by mouth daily. .  NON FORMULARY, Take 2 capsules by mouth daily. .  NON FORMULARY, Take 2 capsules by mouth daily. .  Omega-3 Fatty Acids (FISH OIL) 1000 MG CPDR, Take 2,000 mg by mouth daily. Marland Kitchen  PHYTOSTEROL ESTERS PO, Take 2 tablets by mouth daily. .  valACYclovir (VALTREX) 1000 MG tablet, TAKE 2 TABLETS AT ONSET OF COLD SORE. REPEAT ONCE IN 12 HOURS (4 PILLS/COURSE)   Reviewed prior external information including notes and imaging from  primary care provider As well as notes that were available from care everywhere and other healthcare systems.  Past medical history, social, surgical and family history all reviewed in electronic medical record.  Juarez pertanent information unless stated regarding to the chief complaint.   Review of Systems:  Juarez headache, visual changes, nausea, vomiting, diarrhea, constipation, dizziness, abdominal pain, skin rash, fevers,  chills, night sweats, weight loss, swollen lymph nodes, body aches, joint swelling, chest pain, shortness of breath, mood changes. POSITIVE muscle aches  Objective  Blood pressure 122/80, pulse 95, height 5\' 6"  (1.676 m), weight 165 lb (74.8 kg), last menstrual period 11/18/2008, SpO2 99 %.   General: Juarez apparent distress alert and oriented x3 mood and affect normal, dressed appropriately.  HEENT: Pupils equal, extraocular movements intact  Respiratory: Patient's speak in full sentences and does not appear short of breath  Cardiovascular: Juarez lower extremity edema, non tender, Juarez erythema  Neuro: Cranial nerves II through XII are intact, neurovascularly intact in all extremities with 2+ DTRs and 2+ pulses.  Gait mild antalgic MSK: Patient does have swelling of the right ankle noted.  Pain over the anterior talar dome.  Pain over the lateral malleolus.  Pain over the peroneal tendons.  Neurovascularly intact distally.  Left foot shows the patient's fifth toe does have some bruising noted.  Mild tender to palpation.  Good range of motion of the ankle otherwise  Limited musculoskeletal ultrasound was performed and interpreted by Lyndal Pulley  Limited ultrasound of patient's right ankle shows that there is a mild abnormality of the inferior aspect of lateral malleolus.  Patient does have significant hypoechoic changes within the peroneal tendon sheath.  Most concerning is patient's anterior talar dome with what appears to be a cortical irregularity increasing Doppler flow in hypoechoic changes of the ankle joint itself. Impression: Concern for talar dome injury    Impression and Recommendations:     The above documentation has been reviewed and is accurate and complete Lyndal Pulley, DO

## 2020-09-11 NOTE — Assessment & Plan Note (Signed)
Patient has what appears to be more of a talar dome fracture noted.  Seems to be nondisplaced.  Concerning for potential OCD is valid.  Patient will be put in a cam walker.  We discussed avoiding weightbearing when possible.  Patient is able to walk on it which is a good sign.  Discussed icing regimen, vitamin D supplementation, increase activity slowly.  Follow-up again 2 to 3 weeks

## 2020-09-11 NOTE — Patient Instructions (Addendum)
Xray at [facility] [place] walker until we see you Vit D once weekly K2 256mcg daily for next 4 weeks See me in 2-3 weeks

## 2020-09-12 ENCOUNTER — Other Ambulatory Visit: Payer: Self-pay

## 2020-09-12 ENCOUNTER — Encounter: Payer: Self-pay | Admitting: Family Medicine

## 2020-09-12 MED ORDER — VITAMIN D (ERGOCALCIFEROL) 1.25 MG (50000 UNIT) PO CAPS: 50000.0000 [IU] | ORAL_CAPSULE | ORAL | 0 refills | Status: DC

## 2020-09-13 ENCOUNTER — Telehealth: Payer: Self-pay | Admitting: *Deleted

## 2020-09-13 ENCOUNTER — Other Ambulatory Visit: Payer: Self-pay | Admitting: Family Medicine

## 2020-09-13 DIAGNOSIS — B001 Herpesviral vesicular dermatitis: Secondary | ICD-10-CM

## 2020-09-13 NOTE — Telephone Encounter (Signed)
Is this ok to refill?  

## 2020-09-13 NOTE — Telephone Encounter (Signed)
She just got #31 prescribed <1 month ago.  ?if this is auto-refill.  Doubt she needs, check with pt

## 2020-09-13 NOTE — Telephone Encounter (Signed)
As per current [facility] protocols, she is not a candidate, and likely wouldn't benefit quickly enough for her current scenario.  If she can get it from a pharmacy, she is more than welcomed to, to ease her anxiety.  Unfortunately, I have to hold to the guidelines I'm given.  They are different for [facility] than for [facility], and can change quickly.  As of today, I cannot give this to her. Pay close attention to the news.  The studies regarding boosters are still underway, and there may even be some benefit to mix and matching--we just need to wait for the data.

## 2020-09-13 NOTE — Telephone Encounter (Signed)
Is patient eligible to get the [facility] booster? 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 Please advise.

## 2020-09-13 NOTE — Telephone Encounter (Signed)
I don't think this qualifies her at this point.  Market is this week.  It can take up to 2 weeks to see the full effect of the booster, so since market is ending, I don't see that she would get the benefit for her stated reason.  There may be more info soon regarding boosters (mixing/matching), so keep an eye on the news.

## 2020-09-13 NOTE — Telephone Encounter (Signed)
Called patient and she said she will be after the market continue to work in [employer] and they are very heavily trafficked. She has very high anxiety about the entire situation and would like to know of you would allow her to get the booster.

## 2020-09-13 NOTE — Telephone Encounter (Signed)
Left message advising patient

## 2020-09-17 ENCOUNTER — Other Ambulatory Visit: Payer: Self-pay | Admitting: Family Medicine

## 2020-09-17 DIAGNOSIS — E7800 Pure hypercholesterolemia, unspecified: Secondary | ICD-10-CM

## 2020-09-18 NOTE — Telephone Encounter (Signed)
Pt will not have enough and asked ofr 30 days

## 2020-09-25 ENCOUNTER — Other Ambulatory Visit: Payer: Self-pay

## 2020-09-25 ENCOUNTER — Ambulatory Visit: Payer: Self-pay

## 2020-09-25 ENCOUNTER — Ambulatory Visit (INDEPENDENT_AMBULATORY_CARE_PROVIDER_SITE_OTHER): Payer: 59 | Admitting: Family Medicine

## 2020-09-25 ENCOUNTER — Encounter: Payer: Self-pay | Admitting: Family Medicine

## 2020-09-25 ENCOUNTER — Encounter: Payer: Self-pay | Admitting: Dermatology

## 2020-09-25 ENCOUNTER — Ambulatory Visit (INDEPENDENT_AMBULATORY_CARE_PROVIDER_SITE_OTHER): Payer: 59

## 2020-09-25 ENCOUNTER — Ambulatory Visit (INDEPENDENT_AMBULATORY_CARE_PROVIDER_SITE_OTHER): Payer: 59 | Admitting: Dermatology

## 2020-09-25 VITALS — BP 120/80 | HR 79 | Ht 66.0 in | Wt 165.0 lb

## 2020-09-25 DIAGNOSIS — M79671 Pain in right foot: Secondary | ICD-10-CM | POA: Diagnosis not present

## 2020-09-25 DIAGNOSIS — L309 Dermatitis, unspecified: Secondary | ICD-10-CM | POA: Diagnosis not present

## 2020-09-25 DIAGNOSIS — S92144A Nondisplaced dome fracture of right talus, initial encounter for closed fracture: Secondary | ICD-10-CM

## 2020-09-25 DIAGNOSIS — D485 Neoplasm of uncertain behavior of skin: Secondary | ICD-10-CM | POA: Diagnosis not present

## 2020-09-25 DIAGNOSIS — S92144D Nondisplaced dome fracture of right talus, subsequent encounter for fracture with routine healing: Secondary | ICD-10-CM

## 2020-09-25 MED ORDER — HALCINONIDE 0.1 % EX CREA: 1.0000 "application " | TOPICAL_CREAM | Freq: Every day | CUTANEOUS | 0 refills | Status: DC

## 2020-09-25 NOTE — Progress Notes (Unsigned)
Dg right  

## 2020-09-25 NOTE — Patient Instructions (Signed)
Good to see you Recovery sandals in the house oofos or hoka Keen or maryl rigid toe shoes in the house Boot still out of the house for 2-3 weeks See me again in 2-3 weeks

## 2020-09-25 NOTE — Assessment & Plan Note (Signed)
Patient is making good progress at this time.  I do see good callus formation forming and patient effusion is improved but not completely gone of the ankle joint.  X-rays do show some of the healing as well.  We discussed the cam walker out of the house but then a rigid soled shoe or the possibility of recovery sandals.  Icing regimen noted.  Patient will start with range of motion exercises follow-up again in 2-3 weeks

## 2020-09-25 NOTE — Progress Notes (Signed)
[place] Phone: [phone] Subjective:   Rachel Juarez, am serving as a scribe for Dr. Hulan Saas. This visit occurred during the SARS-CoV-2 public health emergency.  Safety protocols were in place, including screening questions prior to the visit, additional usage of staff PPE, and extensive cleaning of exam room while observing appropriate contact time as indicated for disinfecting solutions.   I'm seeing this patient by the request  of:  Rita Ohara, MD  CC: Ankle pain follow-up  [id number]   09/11/2020 Patient has what appears to be more of a talar dome fracture noted.  Seems to be nondisplaced.  Concerning for potential OCD is valid.  Patient will be put in a cam walker.  We discussed avoiding weightbearing when possible.  Patient is able to walk on it which is a good sign.  Discussed icing regimen, vitamin D supplementation, increase activity slowly.  Follow-up again 2 to 3 weeks  Update 09/25/2020 Rachel Juarez is a 64 y.o. female coming in with complaint of right ankle pain. Patient states she is making progress.  Patient states that it almost hurts more in the boot and have improved.  Still walking the only in the boot.    Patient's x-rays did show that patient did have a talar dome fracture  Past Medical History:  Diagnosis Date  . Allergy   . Anxiety   . Depression, major, in remission ([place]) 2006   when daughter dx'd with autoimmune disorder  . Family history of breast cancer   . Family history of cancer tonsil   . Family history of thyroid cancer   . Generalized anxiety disorder    generalized, and related to mammograms and flying  . Glaucoma suspect    sees Dr. Katy Fitch, has all been okay, sees him qoyr  . Herpes labialis   . Hypercholesterolemia    high HDL, borderline LDL  . Hypothyroidism    treated by Dr. Chalmers Cater  . Multinodular goiter    followed by Dr. Chalmers Cater; u/s q2 yr  . PPD  positive 1988   treated x 6 mos of INH  . Seasonal allergies    takes singulair year-round; worse in the Fall   No past surgical history on file. Social History   Socioeconomic History  . Marital status: Married    Spouse name: Not on file  . Number of children: Not on file  . Years of education: Not on file  . Highest education level: Not on file  Occupational History  . Occupation: Risk manager  Tobacco Use  . Smoking status: Never Smoker  . Smokeless tobacco: Never Used  Vaping Use  . Vaping Use: Never used  Substance and Sexual Activity  . Alcohol use: Yes    Alcohol/week: 0.0 standard drinks    Comment: 1 vodka soda daily at 6pm (1 shot), 2 on weekends (7-10/wk)  . Drug use: No  . Sexual activity: Yes    Partners: Male    Birth control/protection: Post-menopausal  Other Topics Concern  . Not on file  Social History Narrative   Pre-COVID, taught yoga at multiple places.    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   Lives with her husband, and she has a daughter--graduated from [employer], lives in [place], in school at [employer], and will then be applying for nursing school  (comes home for her IVIG treatments still)  1 dog (Malti-poo)   Volunteers at the [place] [employer]:   . Difficulty of Paying Living Expenses: Not on file  Food Insecurity:   . Worried About Charity fundraiser in the Last Year: Not on file  . Ran Out of Food in the Last Year: Not on file  Transportation Needs:   . Lack of Transportation (Medical): Not on file  . Lack of Transportation (Non-Medical): Not on file  Physical Activity:   . Days of Exercise per Week: Not on file  . Minutes of Exercise per Session: Not on file  Stress:   . Feeling of Stress : Not on file  Social Connections:   . Frequency of Communication with Friends and Family: Not on file  . Frequency of Social  Gatherings with Friends and Family: Not on file  . Attends Religious Services: Not on file  . Active Member of Clubs or Organizations: Not on file  . Attends Archivist Meetings: Not on file  . Marital Status: Not on file   Allergies  Allergen Reactions  . Demerol [Meperidine] Other (See Comments)    Low bp, passes out   Family History  Problem Relation Age of Onset  . Lupus Mother   . Cancer Father 16       thymic cancer  . COPD Father   . Heart disease Father        CHF  . Asthma Father   . Cancer Sister   . Breast cancer Sister 79       "best kind", lumpectomy and radiation  . Dermatomyositis Daughter 81       juvenile form  . Irritable bowel syndrome Daughter   . Colon cancer Maternal Grandmother        80's  . Cancer Maternal Grandmother   . Hypertension Paternal Grandmother   . Hyperlipidemia Paternal Grandmother   . Thyroid disease Paternal Grandmother        had goiter removed  . Stroke Paternal Grandmother 81  . Hypertension Paternal Grandfather   . Hyperlipidemia Paternal Grandfather   . Heart disease Paternal Grandfather        MI first in his 21's  . Stroke Paternal Grandfather   . Cancer Paternal Uncle        started on tonsils (smoker)  . Breast cancer Paternal Aunt 98  . Breast cancer Cousin 67  . Breast cancer Cousin 87       (the mother of the 54 y/o cousin with br ca  . Thyroid cancer Cousin   . Diabetes Neg Hx   . Ovarian cancer Neg Hx     Current Outpatient Medications (Endocrine & Metabolic):  .  levothyroxine (SYNTHROID, LEVOTHROID) 75 MCG tablet, Take 75 mcg by mouth daily before breakfast.  Current Outpatient Medications (Cardiovascular):  .  rosuvastatin (CRESTOR) 10 MG tablet, TAKE 1 TABLET BY MOUTH EVERY DAY  Current Outpatient Medications (Respiratory):  .  fexofenadine (ALLEGRA) 180 MG tablet, Take 180 mg by mouth daily. .  montelukast (SINGULAIR) 10 MG tablet, TAKE 1 TABLET BY MOUTH EVERYDAY AT BEDTIME .  Triamcinolone  Acetonide (NASACORT AQ NA), Place 2 sprays into the nose daily.  Current Outpatient Medications (Analgesics):  .  aspirin 81 MG tablet, Take 81 mg by mouth daily.   Current Outpatient Medications (Other):  Marland Kitchen  ALPRAZolam (XANAX) 0.5 MG tablet, Take 0.5-1 tablets (0.25-0.5 mg total) by mouth 3 (three) times daily  as needed for anxiety or sleep. Marland Kitchen  buPROPion (WELLBUTRIN XL) 150 MG 24 hr tablet, Take 1 tablet (150 mg total) by mouth daily. .  Calcium 250 MG CAPS, Take 2 capsules by mouth daily. .  Cholecalciferol (VITAMIN D3) LIQD, Take 2,000 Int'l Units by mouth daily.  .  citalopram (CELEXA) 40 MG tablet, Take 1 tablet (40 mg total) by mouth daily. .  Halcinonide (HALOG) 0.1 % CREA, Apply 1 application topically daily. .  Magnesium 500 MG TABS, Take 1 tablet by mouth daily. Marland Kitchen  MILK THISTLE PO, Take 1 capsule by mouth daily. .  Misc Natural Products (TART CHERRY ADVANCED PO), Take 1 capsule by mouth daily. .  Multiple Vitamins-Minerals (MULTIVITAMIN WITH MINERALS) tablet, Take 1 tablet by mouth daily. .  NON FORMULARY, Take 2 capsules by mouth daily. .  NON FORMULARY, Take 2 capsules by mouth daily. .  Omega-3 Fatty Acids (FISH OIL) 1000 MG CPDR, Take 2,000 mg by mouth daily. Marland Kitchen  PHYTOSTEROL ESTERS PO, Take 2 tablets by mouth daily. .  valACYclovir (VALTREX) 1000 MG tablet, TAKE 2 TABLETS AT ONSET OF COLD SORE. REPEAT ONCE IN 12 HOURS (4 PILLS/COURSE) .  Vitamin D, Ergocalciferol, (DRISDOL) 1.25 MG (50000 UNIT) CAPS capsule, Take 1 capsule (50,000 Units total) by mouth every 7 (seven) days.   Reviewed prior external information including notes and imaging from  primary care provider As well as notes that were available from care everywhere and other healthcare systems.  Past medical history, social, surgical and family history all reviewed in electronic medical record.  No pertanent information unless stated regarding to the chief complaint.   Review of Systems:  No headache, visual  changes, nausea, vomiting, diarrhea, constipation, dizziness, abdominal pain, skin rash, fevers, chills, night sweats, weight loss, swollen lymph nodes, body aches, joint swelling, chest pain, shortness of breath, mood changes. POSITIVE muscle aches  Objective  Blood pressure 120/80, pulse 79, height 5\' 6"  (1.676 m), weight 165 lb (74.8 kg), last menstrual period 11/18/2008, SpO2 97 %.   General: No apparent distress alert and oriented x3 mood and affect normal, dressed appropriately.  HEENT: Pupils equal, extraocular movements intact  Respiratory: Patient's speak in full sentences and does not appear short of breath  Cardiovascular: No lower extremity edema, non tender, no erythema  Antalgic gait. Right ankle exam shows significant decrease in swelling.  Still mild pain over the anterior lateral talar dome area.  Good range of motion with some mild tightness of the posterior cord.  Limited musculoskeletal ultrasound was performed and interpreted by Lyndal Pulley  Limited ultrasound of patient's right ankle shows that the effusion is significantly smaller than previously.  The irregularity over the talar dome is still visualized but does have a soft callus formation surrounding the area.  This does show interval healing.  Increasing in neovascularization also noted Impression: Interval healing of talar dome fracture   Impression and Recommendations:     The above documentation has been reviewed and is accurate and complete Lyndal Pulley, DO

## 2020-09-25 NOTE — Patient Instructions (Signed)

## 2020-09-26 ENCOUNTER — Encounter: Payer: Self-pay | Admitting: Dermatology

## 2020-09-26 ENCOUNTER — Encounter: Payer: Self-pay | Admitting: Family Medicine

## 2020-09-26 ENCOUNTER — Other Ambulatory Visit: Payer: Self-pay

## 2020-09-26 ENCOUNTER — Telehealth: Payer: Self-pay | Admitting: Family Medicine

## 2020-09-26 DIAGNOSIS — S99911A Unspecified injury of right ankle, initial encounter: Secondary | ICD-10-CM

## 2020-09-26 DIAGNOSIS — S92144D Nondisplaced dome fracture of right talus, subsequent encounter for fracture with routine healing: Secondary | ICD-10-CM

## 2020-09-26 NOTE — Progress Notes (Signed)
   Follow-Up Visit   Subjective  Rachel Juarez is a 64 y.o. female who presents for the following: Skin Problem (Patient here today for two spots one behind the right knee and the other on the left lower shin x years.  Per patient they do bleed when she shaves, they are getting larger per patient.).  Growths Location: Legs Duration:  Quality: Larger Associated Signs/Symptoms: Modifying Factors: Bleed with shaving Severity:  Timing: Context:   Objective  Well appearing patient in no apparent distress; mood and affect are within normal limits.  A focused examination was performed including Legs.. Relevant physical exam findings are noted in the Assessment and Plan.   Assessment & Plan    Neoplasm of uncertain behavior of skin (2) Left Lower Leg - Anterior  Skin / nail biopsy Type of biopsy: tangential   Informed consent: discussed and consent obtained   Timeout: patient name, date of birth, surgical site, and procedure verified   Procedure prep:  Patient was prepped and draped in usual sterile fashion (Non sterile) Prep type:  Chlorhexidine Anesthesia: the lesion was anesthetized in a standard fashion   Anesthetic:  1% lidocaine w/ epinephrine 1-100,000 local infiltration Instrument used: flexible razor blade   Hemostasis achieved with: electrodesiccation   Outcome: patient tolerated procedure well   Post-procedure details: sterile dressing applied and wound care instructions given   Dressing type: petrolatum and pressure dressing   Additional details:  Curet after biopsy  Specimen 1 - Surgical pathology Differential Diagnosis: Wart vs SCC Check Margins: No Curet and cautery  Right Upper Calf  Skin / nail biopsy Type of biopsy: tangential   Informed consent: discussed and consent obtained   Timeout: patient name, date of birth, surgical site, and procedure verified   Procedure prep:  Patient was prepped and draped in usual sterile fashion (Non sterile) Prep type:   Chlorhexidine Anesthesia: the lesion was anesthetized in a standard fashion   Anesthetic:  1% lidocaine w/ epinephrine 1-100,000 local infiltration Instrument used: flexible razor blade   Hemostasis achieved with: electrodesiccation   Outcome: patient tolerated procedure well   Post-procedure details: sterile dressing applied and wound care instructions given   Dressing type: bandage and petrolatum   Additional details:  Curet after biopsy  Specimen 2 - Surgical pathology Differential Diagnosis: R/O Wart vs SCC Check Margins: No Curet and cautery  Dermatitis Right Upper Back  Use topical cream that was given as a sample today and call back in 4-6 weeks with an update.  Halcinonide (HALOG) 0.1 % CREA - Right Upper Back     I, Lavonna Monarch, MD, have reviewed all documentation for this visit.  The documentation on 09/26/20 for the exam, diagnosis, procedures, and orders are all accurate and complete.

## 2020-09-26 NOTE — Telephone Encounter (Signed)
Ray from Radiology called and wanted to give you a routine report on pt. He said you can call him back tomorrow his number is [phone]

## 2020-09-27 NOTE — Telephone Encounter (Signed)
Called back, we did not order this-Dr.Z Tamala Julian did.

## 2020-10-01 ENCOUNTER — Ambulatory Visit (INDEPENDENT_AMBULATORY_CARE_PROVIDER_SITE_OTHER): Payer: 59

## 2020-10-01 ENCOUNTER — Other Ambulatory Visit: Payer: Self-pay

## 2020-10-01 DIAGNOSIS — S92154A Nondisplaced avulsion fracture (chip fracture) of right talus, initial encounter for closed fracture: Secondary | ICD-10-CM | POA: Diagnosis not present

## 2020-10-01 DIAGNOSIS — S92124A Nondisplaced fracture of body of right talus, initial encounter for closed fracture: Secondary | ICD-10-CM

## 2020-10-01 DIAGNOSIS — S99911A Unspecified injury of right ankle, initial encounter: Secondary | ICD-10-CM

## 2020-10-03 ENCOUNTER — Encounter: Payer: Self-pay | Admitting: Family Medicine

## 2020-10-04 ENCOUNTER — Other Ambulatory Visit: Payer: Self-pay

## 2020-10-04 ENCOUNTER — Other Ambulatory Visit: Payer: 59

## 2020-10-04 DIAGNOSIS — Z5181 Encounter for therapeutic drug level monitoring: Secondary | ICD-10-CM

## 2020-10-04 DIAGNOSIS — E7800 Pure hypercholesterolemia, unspecified: Secondary | ICD-10-CM

## 2020-10-05 ENCOUNTER — Encounter: Payer: Self-pay | Admitting: Family Medicine

## 2020-10-05 DIAGNOSIS — E7800 Pure hypercholesterolemia, unspecified: Secondary | ICD-10-CM

## 2020-10-05 LAB — HEPATIC FUNCTION PANEL
ALT: 37 IU/L — ABNORMAL HIGH (ref 0–32)
AST: 25 IU/L (ref 0–40)
Albumin: 4.8 g/dL (ref 3.8–4.8)
Alkaline Phosphatase: 86 IU/L (ref 44–121)
Bilirubin Total: 0.4 mg/dL (ref 0.0–1.2)
Bilirubin, Direct: 0.15 mg/dL (ref 0.00–0.40)
Total Protein: 6.8 g/dL (ref 6.0–8.5)

## 2020-10-05 LAB — LIPID PANEL
Chol/HDL Ratio: 2.2 ratio (ref 0.0–4.4)
Cholesterol, Total: 202 mg/dL — ABNORMAL HIGH (ref 100–199)
HDL: 91 mg/dL (ref >39)
LDL Chol Calc (NIH): 97 mg/dL (ref 0–99)
Triglycerides: 78 mg/dL (ref 0–149)
VLDL Cholesterol Cal: 14 mg/dL (ref 5–40)

## 2020-10-05 MED ORDER — ROSUVASTATIN CALCIUM 10 MG PO TABS: 10.0000 mg | ORAL_TABLET | Freq: Every day | ORAL | 1 refills | Status: DC

## 2020-10-09 ENCOUNTER — Ambulatory Visit (INDEPENDENT_AMBULATORY_CARE_PROVIDER_SITE_OTHER): Payer: 59 | Admitting: Family Medicine

## 2020-10-09 ENCOUNTER — Other Ambulatory Visit: Payer: Self-pay

## 2020-10-09 ENCOUNTER — Encounter: Payer: Self-pay | Admitting: Family Medicine

## 2020-10-09 DIAGNOSIS — S92144D Nondisplaced dome fracture of right talus, subsequent encounter for fracture with routine healing: Secondary | ICD-10-CM

## 2020-10-09 NOTE — Progress Notes (Signed)
[facility] [address] [place] Phone: [phone] Subjective:   I [name] am serving as a Education administrator for Dr. Hulan Saas.  This visit occurred during the SARS-CoV-2 public health emergency.  Safety protocols were in place, including screening questions prior to the visit, additional usage of staff PPE, and extensive cleaning of exam room while observing appropriate contact time as indicated for disinfecting solutions.   I'm seeing this patient by the request  of:  Rita Ohara, MD  CC: foot and ankle pain follow up   [id number]   09/25/2020 Patient is making good progress at this time.  I do see good callus formation forming and patient effusion is improved but not completely gone of the ankle joint.  X-rays do show some of the healing as well.  We discussed the cam walker out of the house but then a rigid soled shoe or the possibility of recovery sandals.  Icing regimen noted.  Patient will start with range of motion exercises follow-up again in 2-3 weeks   Update 10/09/2020 Rachel Juarez is a 64 y.o. female coming in with complaint of right foot, talar dome fracture. Patient states her foot is doing ok. Foot is making progress.  Patient states that now she seems to do well.  Has been wearing a recovery sandal instead of a cam walker and having no significant pain.  Not even taking any pain medication.  Continues to use the cam walker out of the house.  No new symptoms.  Patient continues to have abnormal findings on x-ray as well as ultrasound and was sent for an MRI MRI was independently visualized by me.  Patient was found to have a nondisplaced fracture of the posterior medial talus as well as a complete tear of the ATFL and the avulsion fracture of the distal talus.     Past Medical History:  Diagnosis Date   Allergy    Anxiety    Depression, major, in remission ([place]) 2006   when daughter dx'd with autoimmune disorder    Family history of breast cancer    Family history of cancer tonsil    Family history of thyroid cancer    Generalized anxiety disorder    generalized, and related to mammograms and flying   Glaucoma suspect    sees Dr. Katy Fitch, has all been okay, sees him qoyr   Herpes labialis    Hypercholesterolemia    high HDL, borderline LDL   Hypothyroidism    treated by Dr. Chalmers Cater   Multinodular goiter    followed by Dr. Chalmers Cater; u/s q2 yr   PPD positive 1988   treated x 6 mos of INH   Seasonal allergies    takes singulair year-round; worse in the Fall   No past surgical history on file. Social History   Socioeconomic History   Marital status: Married    Spouse name: Not on file   Number of children: Not on file   Years of education: Not on file   Highest education level: Not on file  Occupational History   Occupation: yoga teacher  Tobacco Use   Smoking status: Never Smoker   Smokeless tobacco: Never Used  Vaping Use   Vaping Use: Never used  Substance and Sexual Activity   Alcohol use: Yes    Alcohol/week: 0.0 standard drinks    Comment: 1 vodka soda daily at 6pm (1 shot), 2 on weekends (7-10/wk)   Drug use: No   Sexual  activity: Yes    Partners: Male    Birth control/protection: Post-menopausal  Other Topics Concern   Not on file  Social History Narrative   Pre-COVID, taught yoga at multiple places.    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   Lives with her husband, and she has a daughter--graduated from [employer], lives in [place], in school at [employer], and will then be applying for nursing school  (comes home for her IVIG treatments still)   1 dog (Malti-poo)   Volunteers at the [employer]:    Difficulty of Paying Living Expenses: Not on file  Food Insecurity:    Worried About Charity fundraiser in the Last Year: Not on file     [place] in the Last Year: Not on file  Transportation Needs:    Lack of Transportation (Medical): Not on file   Lack of Transportation (Non-Medical): Not on file  Physical Activity:    Days of Exercise per Week: Not on file   Minutes of Exercise per Session: Not on file  Stress:    Feeling of Stress : Not on file  Social Connections:    Frequency of Communication with Friends and Family: Not on file   Frequency of Social Gatherings with Friends and Family: Not on file   Attends Religious Services: Not on file   Active Member of Clubs or Organizations: Not on file   Attends Archivist Meetings: Not on file   Marital Status: Not on file   Allergies  Allergen Reactions   Demerol [Meperidine] Other (See Comments)    Low bp, passes out   Family History  Problem Relation Age of Onset   Lupus Mother    Cancer Father 58       thymic cancer   COPD Father    Heart disease Father        CHF   Asthma Father    Cancer Sister    Breast cancer Sister 28       "best kind", lumpectomy and radiation   Dermatomyositis Daughter 64       juvenile form   Irritable bowel syndrome Daughter    Colon cancer Maternal Grandmother        80's   Cancer Maternal Grandmother    Hypertension Paternal Grandmother    Hyperlipidemia Paternal Grandmother    Thyroid disease Paternal Grandmother        had goiter removed   Stroke Paternal Grandmother 24   Hypertension Paternal Grandfather    Hyperlipidemia Paternal Grandfather    Heart disease Paternal Grandfather        MI first in his 68's   Stroke Paternal Grandfather    Cancer Paternal Uncle        started on tonsils (smoker)   Breast cancer Paternal Aunt 36   Breast cancer Cousin 31   Breast cancer Cousin 71       (the mother of the 27 y/o cousin with br ca   Thyroid cancer Cousin    Diabetes Neg Hx    Ovarian cancer Neg Hx     Current Outpatient Medications (Endocrine &  Metabolic):    levothyroxine (SYNTHROID, LEVOTHROID) 75 MCG tablet, Take 75 mcg by mouth daily before breakfast.  Current Outpatient Medications (Cardiovascular):    rosuvastatin (CRESTOR) 10 MG tablet, Take 1 tablet (10 mg  total) by mouth daily.  Current Outpatient Medications (Respiratory):    fexofenadine (ALLEGRA) 180 MG tablet, Take 180 mg by mouth daily.   montelukast (SINGULAIR) 10 MG tablet, TAKE 1 TABLET BY MOUTH EVERYDAY AT BEDTIME   Triamcinolone Acetonide (NASACORT AQ NA), Place 2 sprays into the nose daily.  Current Outpatient Medications (Analgesics):    aspirin 81 MG tablet, Take 81 mg by mouth daily.   Current Outpatient Medications (Other):    ALPRAZolam (XANAX) 0.5 MG tablet, Take 0.5-1 tablets (0.25-0.5 mg total) by mouth 3 (three) times daily as needed for anxiety or sleep.   buPROPion (WELLBUTRIN XL) 150 MG 24 hr tablet, Take 1 tablet (150 mg total) by mouth daily.   Calcium 250 MG CAPS, Take 2 capsules by mouth daily.   Cholecalciferol (VITAMIN D3) LIQD, Take 2,000 Int'l Units by mouth daily.    citalopram (CELEXA) 40 MG tablet, Take 1 tablet (40 mg total) by mouth daily.   Halcinonide (HALOG) 0.1 % CREA, Apply 1 application topically daily.   Magnesium 500 MG TABS, Take 1 tablet by mouth daily.   MILK THISTLE PO, Take 1 capsule by mouth daily.   Misc Natural Products (TART CHERRY ADVANCED PO), Take 1 capsule by mouth daily.   Multiple Vitamins-Minerals (MULTIVITAMIN WITH MINERALS) tablet, Take 1 tablet by mouth daily.   NON FORMULARY, Take 2 capsules by mouth daily.   NON FORMULARY, Take 2 capsules by mouth daily.   Omega-3 Fatty Acids (FISH OIL) 1000 MG CPDR, Take 2,000 mg by mouth daily.   PHYTOSTEROL ESTERS PO, Take 2 tablets by mouth daily.   valACYclovir (VALTREX) 1000 MG tablet, TAKE 2 TABLETS AT ONSET OF COLD SORE. REPEAT ONCE IN 12 HOURS (4 PILLS/COURSE)   Vitamin D, Ergocalciferol, (DRISDOL) 1.25 MG (50000 UNIT) CAPS capsule, Take 1  capsule (50,000 Units total) by mouth every 7 (seven) days.   Reviewed prior external information including notes and imaging from  primary care provider As well as notes that were available from care everywhere and other healthcare systems.  Past medical history, social, surgical and family history all reviewed in electronic medical record.  No pertanent information unless stated regarding to the chief complaint.   Review of Systems:  No headache, visual changes, nausea, vomiting, diarrhea, constipation, dizziness, abdominal pain, skin rash, fevers, chills, night sweats, weight loss, swollen lymph nodes, body aches, joint swelling, chest pain, shortness of breath, mood changes. POSITIVE muscle aches  Objective  Blood pressure 114/72, pulse 72, height 5\' 6"  (1.676 m), weight 167 lb (75.8 kg), last menstrual period 11/18/2008, SpO2 96 %.   General: No apparent distress alert and oriented x3 mood and affect normal, dressed appropriately.  HEENT: Pupils equal, extraocular movements intact  Respiratory: Patient's speak in full sentences and does not appear short of breath  Cardiovascular: No lower extremity edema, non tender, no erythema  Neuro: Cranial nerves II through XII are intact, neurovascularly intact in all extremities with 2+ DTRs and 2+ pulses.  Patient is actually ambulating very well. Right ankle no longer has tenderness over the anterior talar dome.  Patient has improvement in range of motion.  Mild gapping of the ATFL but is noted no.  No pain over the Achilles.  Mild pain over the peroneal's with palpation.    Impression and Recommendations:     The above documentation has been reviewed and is accurate and complete Lyndal Pulley, DO

## 2020-10-09 NOTE — Patient Instructions (Addendum)
Good to see you  You are making great progress.  Boot still another 2 weeks Start the exercises in 1 week and only do them 3 times a week  Can switch to rigid sole shoe in 2 weeks ouut of the house, if severe pain or swelling then go back into the boot.  Heel lift in the other shoe See me again in 4 weeks

## 2020-10-09 NOTE — Assessment & Plan Note (Signed)
Patient seems to be making significant improvement. Did show the fractures but seems to be nondisplaced.  Patient has good range of motion with no effusion noted today.  We will continue the cam walker for another 2 weeks.  Patient has already been transitioning to a shoe in the house.  Patient feels like she is making progress.  Follow-up again in 4 weeks otherwise home exercises given but if patient has a setback she knows to go back to the cam walker and will consider formal physical therapy

## 2020-10-14 ENCOUNTER — Other Ambulatory Visit: Payer: 59

## 2020-10-29 NOTE — Addendum Note (Signed)
Addended by: Lavonna Monarch on: 10/29/2020 10:19 AM   Modules accepted: Level of Service

## 2020-11-06 ENCOUNTER — Other Ambulatory Visit: Payer: Self-pay | Admitting: Family Medicine

## 2020-11-06 ENCOUNTER — Telehealth: Payer: Self-pay | Admitting: Family Medicine

## 2020-11-06 DIAGNOSIS — F411 Generalized anxiety disorder: Secondary | ICD-10-CM

## 2020-11-06 DIAGNOSIS — F321 Major depressive disorder, single episode, moderate: Secondary | ICD-10-CM

## 2020-11-06 DIAGNOSIS — J302 Other seasonal allergic rhinitis: Secondary | ICD-10-CM

## 2020-11-06 DIAGNOSIS — E7800 Pure hypercholesterolemia, unspecified: Secondary | ICD-10-CM

## 2020-11-06 MED ORDER — CITALOPRAM HYDROBROMIDE 40 MG PO TABS: 40.0000 mg | ORAL_TABLET | Freq: Every day | ORAL | 0 refills | Status: DC

## 2020-11-06 MED ORDER — BUPROPION HCL ER (XL) 150 MG PO TB24: 150.0000 mg | ORAL_TABLET | Freq: Every day | ORAL | 0 refills | Status: DC

## 2020-11-06 MED ORDER — ROSUVASTATIN CALCIUM 10 MG PO TABS: 10.0000 mg | ORAL_TABLET | Freq: Every day | ORAL | 0 refills | Status: DC

## 2020-11-06 MED ORDER — MONTELUKAST SODIUM 10 MG PO TABS: ORAL_TABLET | ORAL | 0 refills | Status: DC

## 2020-11-06 NOTE — Telephone Encounter (Signed)
Sent in meds for 3 days

## 2020-11-06 NOTE — Telephone Encounter (Signed)
Okay to send these

## 2020-11-06 NOTE — Telephone Encounter (Signed)
Pt called and states that she in [place] and has lost her RX at the hotel and wants to know if you can send her 3 days worth of her RX she needs singular, crestor, celexa, and wellbutrin   Please send to the [facility]  [address] [place] [address]

## 2020-11-07 ENCOUNTER — Other Ambulatory Visit: Payer: Self-pay | Admitting: Family Medicine

## 2020-11-07 DIAGNOSIS — B001 Herpesviral vesicular dermatitis: Secondary | ICD-10-CM

## 2020-11-07 DIAGNOSIS — F419 Anxiety disorder, unspecified: Secondary | ICD-10-CM

## 2020-11-07 MED ORDER — ALPRAZOLAM 0.5 MG PO TABS
0.2500 mg | ORAL_TABLET | Freq: Three times a day (TID) | ORAL | 0 refills | Status: DC | PRN
Start: 2020-11-07 — End: 2021-08-24

## 2020-11-07 MED ORDER — VALACYCLOVIR HCL 1 G PO TABS
ORAL_TABLET | ORAL | 0 refills | Status: DC
Start: 2020-11-07 — End: 2021-07-05

## 2020-11-07 NOTE — Telephone Encounter (Signed)
[facility]  Is requesting to fill pt xanax and valtrex. Please advise [facility]

## 2020-11-13 NOTE — Progress Notes (Signed)
[facility] [address] [place] Phone: [phone] Subjective:   I Rachel Juarez am serving as a Education administrator for Dr. Hulan Saas.  This visit occurred during the SARS-CoV-2 public health emergency.  Safety protocols were in place, including screening questions prior to the visit, additional usage of staff PPE, and extensive cleaning of exam room while observing appropriate contact time as indicated for disinfecting solutions.   I'm seeing this patient by the request  of:  Rita Ohara, MD  CC: Ankle pain follow-up  [id number]   10/09/2020 Patient seems to be making significant improvement. Did show the fractures but seems to be nondisplaced.  Patient has good range of motion with no effusion noted today.  We will continue the cam walker for another 2 weeks.  Patient has already been transitioning to a shoe in the house.  Patient feels like she is making progress.  Follow-up again in 4 weeks otherwise home exercises given but if patient has a setback she knows to go back to the cam walker and will consider formal physical therapy  Update 11/14/2020 Rachel Juarez is a 64 y.o. female coming in with complaint of right talar dome fracture. Patient states the foot is doing ok. Still notices pain and believes she tweaked it doing the exercises last week.    MRI results  IMPRESSION: Acute mid tarsal joint sprain with associated avulsion fracture of the dorsal margin of the distal talus.  Nondisplaced and incomplete fracture through the posteromedial talus is also identified.  Complete tear of the ATFL from the fibula.  Longitudinal split tear of the peroneus brevis.  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  Past Medical History:  Diagnosis Date  . Allergy   . Anxiety   . Depression, major, in remission ([place]) 2006   when daughter dx'd with autoimmune disorder  . Family history of breast cancer   . Family history of cancer  tonsil   . Family history of thyroid cancer   . Generalized anxiety disorder    generalized, and related to mammograms and flying  . Glaucoma suspect    sees Dr. Katy Fitch, has all been okay, sees him qoyr  . Herpes labialis   . Hypercholesterolemia    high HDL, borderline LDL  . Hypothyroidism    treated by Dr. Chalmers Cater  . Multinodular goiter    followed by Dr. Chalmers Cater; u/s q2 yr  . 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 Seasonal allergies    takes singulair year-round; worse in the Fall   No past surgical history on file. Social History   Socioeconomic History  . Marital status: Married    Spouse name: Not on file  . Number of children: Not on file  . Years of education: Not on file  . Highest education level: Not on file  Occupational History  . Occupation: Risk manager  Tobacco Use  . Smoking status: Never Smoker  . Smokeless tobacco: Never Used  Vaping Use  . Vaping Use: Never used  Substance and Sexual Activity  . Alcohol use: Yes    Alcohol/week: 0.0 standard drinks    Comment: 1 vodka soda daily at 6pm (1 shot), 2 on weekends (7-10/wk)  . Drug use: No  . Sexual activity: Yes    Partners: Male    Birth control/protection: Post-menopausal  Other Topics Concern  . Not on file  Social History Narrative   Pre-COVID, taught yoga at multiple places.  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   Lives with her husband, and she has a daughter--graduated from [employer], lives in [place], in school at [employer], and will then be applying for nursing school  (comes home for her IVIG treatments still)   1 dog (Malti-poo)   Volunteers at the [place] [employer]: Not on file  Food Insecurity: Not on file  Transportation Needs: Not on file  Physical Activity: Not on file  Stress: Not on file  Social Connections: Not on file   Allergies  Allergen Reactions  .  Demerol [Meperidine] Other (See Comments)    Low bp, passes out   Family History  Problem Relation Age of Onset  . Lupus Mother   . Cancer Father 72       thymic cancer  . COPD Father   . Heart disease Father        CHF  . Asthma Father   . Cancer Sister   . Breast cancer Sister 27       "best kind", lumpectomy and radiation  . Dermatomyositis Daughter 4       juvenile form  . Irritable bowel syndrome Daughter   . Colon cancer Maternal Grandmother        80's  . Cancer Maternal Grandmother   . Hypertension Paternal Grandmother   . Hyperlipidemia Paternal Grandmother   . Thyroid disease Paternal Grandmother        had goiter removed  . Stroke Paternal Grandmother 81  . Hypertension Paternal Grandfather   . Hyperlipidemia Paternal Grandfather   . Heart disease Paternal Grandfather        MI first in his 43's  . Stroke Paternal Grandfather   . Cancer Paternal Uncle        started on tonsils (smoker)  . Breast cancer Paternal Aunt 66  . Breast cancer Cousin 23  . Breast cancer Cousin 4       (the mother of the 86 y/o cousin with br ca  . Thyroid cancer Cousin   . Diabetes Neg Hx   . Ovarian cancer Neg Hx     Current Outpatient Medications (Endocrine & Metabolic):  .  levothyroxine (SYNTHROID, LEVOTHROID) 75 MCG tablet, Take 75 mcg by mouth daily before breakfast.  Current Outpatient Medications (Cardiovascular):  .  rosuvastatin (CRESTOR) 10 MG tablet, Take 1 tablet (10 mg total) by mouth daily.  Current Outpatient Medications (Respiratory):  .  fexofenadine (ALLEGRA) 180 MG tablet, Take 180 mg by mouth daily. .  montelukast (SINGULAIR) 10 MG tablet, TAKE 1 TABLET BY MOUTH EVERYDAY AT BEDTIME .  Triamcinolone Acetonide (NASACORT AQ NA), Place 2 sprays into the nose daily.  Current Outpatient Medications (Analgesics):  .  aspirin 81 MG tablet, Take 81 mg by mouth daily.   Current Outpatient Medications (Other):  Marland Kitchen  ALPRAZolam (XANAX) 0.5 MG tablet, Take 0.5-1  tablets (0.25-0.5 mg total) by mouth 3 (three) times daily as needed for anxiety or sleep. Marland Kitchen  buPROPion (WELLBUTRIN XL) 150 MG 24 hr tablet, Take 1 tablet (150 mg total) by mouth daily. .  Calcium 250 MG CAPS, Take 2 capsules by mouth daily. .  Cholecalciferol (VITAMIN D3) LIQD, Take 2,000 Int'l Units by mouth daily.  .  citalopram (CELEXA) 40 MG tablet, Take 1 tablet (40 mg total) by mouth daily. .  Halcinonide (HALOG) 0.1 % CREA, Apply 1  application topically daily. .  Magnesium 500 MG TABS, Take 1 tablet by mouth daily. Marland Kitchen  MILK THISTLE PO, Take 1 capsule by mouth daily. .  Misc Natural Products (TART CHERRY ADVANCED PO), Take 1 capsule by mouth daily. .  Multiple Vitamins-Minerals (MULTIVITAMIN WITH MINERALS) tablet, Take 1 tablet by mouth daily. .  NON FORMULARY, Take 2 capsules by mouth daily. .  NON FORMULARY, Take 2 capsules by mouth daily. .  Omega-3 Fatty Acids (FISH OIL) 1000 MG CPDR, Take 2,000 mg by mouth daily. Marland Kitchen  PHYTOSTEROL ESTERS PO, Take 2 tablets by mouth daily. .  valACYclovir (VALTREX) 1000 MG tablet, TAKE 2 TABLETS AT ONSET OF COLD SORE. REPEAT ONCE IN 12 HOURS (4 PILLS/COURSE) .  Vitamin D, Ergocalciferol, (DRISDOL) 1.25 MG (50000 UNIT) CAPS capsule, Take 1 capsule (50,000 Units total) by mouth every 7 (seven) days.   Reviewed prior external information including notes and imaging from  primary care provider As well as notes that were available from care everywhere and other healthcare systems.  Past medical history, social, surgical and family history all reviewed in electronic medical record.  No pertanent information unless stated regarding to the chief complaint.   Review of Systems:  No headache, visual changes, nausea, vomiting, diarrhea, constipation, dizziness, abdominal pain, skin rash, fevers, chills, night sweats, weight loss, swollen lymph nodes, body aches, joint swelling, chest pain, shortness of breath, mood changes. POSITIVE muscle aches  Objective   Blood pressure 118/70, pulse (!) 102, height 5\' 6"  (1.676 m), weight 166 lb (75.3 kg), last menstrual period 11/18/2008, SpO2 95 %.   General: No apparent distress alert and oriented x3 mood and affect normal, dressed appropriately.  HEENT: Pupils equal, extraocular movements intact  Respiratory: Patient's speak in full sentences and does not appear short of breath  Ankle: Right ankle exam shows that patient does not have any true effusion noted.  Very mild positive anterior drawer.  Patient has mild pain over the peroneal tendons but otherwise unremarkable.  Still has some lack of 5 degrees of dorsiflexion of the foot but full plantarflexion.  Neurovascularly intact distally  MSK US performed of: Right ankle This study was ordered, performed, and interpreted by Charlann Boxer D.O.  Foot/Ankle:   All structures visualized.   Talar dome chronic changes noted but no acute tear) no significant effusion of the joint space Patient's lateral malleolus has what appears to be an avulsion fracture from the ATFL previously seen now that the hypoechoic changes are gone.  Patient does have scarring noted of the peroneal tendon that corresponds with patient's previous MRI.  IMPRESSION: Interval improvement again.     Impression and Recommendations:     The above documentation has been reviewed and is accurate and complete Lyndal Pulley, DO

## 2020-11-14 ENCOUNTER — Other Ambulatory Visit: Payer: Self-pay

## 2020-11-14 ENCOUNTER — Ambulatory Visit (INDEPENDENT_AMBULATORY_CARE_PROVIDER_SITE_OTHER): Payer: 59 | Admitting: Family Medicine

## 2020-11-14 ENCOUNTER — Encounter: Payer: Self-pay | Admitting: Family Medicine

## 2020-11-14 ENCOUNTER — Ambulatory Visit: Payer: Self-pay

## 2020-11-14 VITALS — BP 118/70 | HR 102 | Ht 66.0 in | Wt 166.0 lb

## 2020-11-14 DIAGNOSIS — M25571 Pain in right ankle and joints of right foot: Secondary | ICD-10-CM | POA: Diagnosis not present

## 2020-11-14 DIAGNOSIS — S92144D Nondisplaced dome fracture of right talus, subsequent encounter for fracture with routine healing: Secondary | ICD-10-CM

## 2020-11-14 MED ORDER — VITAMIN D (ERGOCALCIFEROL) 1.25 MG (50000 UNIT) PO CAPS: 50000.0000 [IU] | ORAL_CAPSULE | ORAL | 0 refills | Status: DC

## 2020-11-14 NOTE — Assessment & Plan Note (Signed)
Patient does have the talar dome fracture but seems to be doing well.  No significant effusion of the ankle noted.  Patient does have some very mild anterior drawer test noted.  Patient will start with formal physical therapy.  We discussed again about the potential for other things such as referring to orthopedic surgery but patient is making improvement and would like to avoid any type of surgical intervention.  We will have patient start with physical therapy see me again 6 to 8 weeks

## 2020-11-14 NOTE — Patient Instructions (Addendum)
PT [facility] PT for ankle Frog splint at night See me again in 7-8 weeks

## 2020-12-11 ENCOUNTER — Other Ambulatory Visit: Payer: Self-pay | Admitting: Family Medicine

## 2020-12-11 DIAGNOSIS — J302 Other seasonal allergic rhinitis: Secondary | ICD-10-CM

## 2020-12-31 NOTE — Progress Notes (Deleted)
Rachel Juarez [facility] Phone: [phone] Subjective:    I'm seeing this patient by the request  of:  Rita Ohara, MD  CC:   PIR:[id number]   11/14/2020 Patient does have the talar dome fracture but seems to be doing well.  No significant effusion of the ankle noted.  Patient does have some very mild anterior drawer test noted.  Patient will start with formal physical therapy.  We discussed again about the potential for other things such as referring to orthopedic surgery but patient is making improvement and would like to avoid any type of surgical intervention.  We will have patient start with physical therapy see me again 6 to 8 weeks  Update 01/01/2021 Unique Sillas is a 65 y.o. female coming in with complaint of right ankle pain. Began PT at [facility] on 12/04/2020. Patient states       Past Medical History:  Diagnosis Date  . Allergy   . Anxiety   . Depression, major, in remission ([place]) 2006   when daughter dx'd with autoimmune disorder  . Family history of breast cancer   . Family history of cancer tonsil   . Family history of thyroid cancer   . Generalized anxiety disorder    generalized, and related to mammograms and flying  . Glaucoma suspect    sees Dr. Katy Fitch, has all been okay, sees him qoyr  . Herpes labialis   . Hypercholesterolemia    high HDL, borderline LDL  . Hypothyroidism    treated by Dr. Chalmers Cater  . Multinodular goiter    followed by Dr. Chalmers Cater; u/s q2 yr  . 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 Seasonal allergies    takes singulair year-round; worse in the Fall   No past surgical history on file. Social History   Socioeconomic History  . Marital status: Married    Spouse name: Not on file  . Number of children: Not on file  . Years of education: Not on file  . Highest education level: Not on file  Occupational History  . Occupation: Risk manager  Tobacco Use  . Smoking status:  Never Smoker  . Smokeless tobacco: Never Used  Vaping Use  . Vaping Use: Never used  Substance and Sexual Activity  . Alcohol use: Yes    Alcohol/week: 0.0 standard drinks    Comment: 1 vodka soda daily at 6pm (1 shot), 2 on weekends (7-10/wk)  . Drug use: No  . Sexual activity: Yes    Partners: Male    Birth control/protection: Post-menopausal  Other Topics Concern  . Not on file  Social History Narrative   Pre-COVID, taught yoga at multiple places.    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   Lives with her husband, and she has a daughter--graduated from [employer], lives in [place], in school at [employer], and will then be applying for nursing school  (comes home for her IVIG treatments still)   1 dog (Malti-poo)   Volunteers at the [place] [employer] Strain: Not on file  Food Insecurity: Not on file  Transportation Needs: Not on file  Physical Activity: Not on file  Stress: Not on file  Social Connections: Not on file   Allergies  Allergen Reactions  . Demerol [Meperidine] Other (See Comments)    Low  bp, passes out   Family History  Problem Relation Age of Onset  . Lupus Mother   . Cancer Father 47       thymic cancer  . COPD Father   . Heart disease Father        CHF  . Asthma Father   . Cancer Sister   . Breast cancer Sister 33       "best kind", lumpectomy and radiation  . Dermatomyositis Daughter 18       juvenile form  . Irritable bowel syndrome Daughter   . Colon cancer Maternal Grandmother        80's  . Cancer Maternal Grandmother   . Hypertension Paternal Grandmother   . Hyperlipidemia Paternal Grandmother   . Thyroid disease Paternal Grandmother        had goiter removed  . Stroke Paternal Grandmother 81  . Hypertension Paternal Grandfather   . Hyperlipidemia Paternal Grandfather   . Heart disease Paternal Grandfather        MI first in his  86's  . Stroke Paternal Grandfather   . Cancer Paternal Uncle        started on tonsils (smoker)  . Breast cancer Paternal Aunt 5  . Breast cancer Cousin 66  . Breast cancer Cousin 64       (the mother of the 47 y/o cousin with br ca  . Thyroid cancer Cousin   . Diabetes Neg Hx   . Ovarian cancer Neg Hx     Current Outpatient Medications (Endocrine & Metabolic):  .  levothyroxine (SYNTHROID, LEVOTHROID) 75 MCG tablet, Take 75 mcg by mouth daily before breakfast.  Current Outpatient Medications (Cardiovascular):  .  rosuvastatin (CRESTOR) 10 MG tablet, Take 1 tablet (10 mg total) by mouth daily.  Current Outpatient Medications (Respiratory):  .  fexofenadine (ALLEGRA) 180 MG tablet, Take 180 mg by mouth daily. .  montelukast (SINGULAIR) 10 MG tablet, TAKE 1 TABLET BY MOUTH EVERYDAY AT BEDTIME .  Triamcinolone Acetonide (NASACORT AQ NA), Place 2 sprays into the nose daily.  Current Outpatient Medications (Analgesics):  .  aspirin 81 MG tablet, Take 81 mg by mouth daily.   Current Outpatient Medications (Other):  Marland Kitchen  ALPRAZolam (XANAX) 0.5 MG tablet, Take 0.5-1 tablets (0.25-0.5 mg total) by mouth 3 (three) times daily as needed for anxiety or sleep. Marland Kitchen  buPROPion (WELLBUTRIN XL) 150 MG 24 hr tablet, Take 1 tablet (150 mg total) by mouth daily. .  Calcium 250 MG CAPS, Take 2 capsules by mouth daily. .  Cholecalciferol (VITAMIN D3) LIQD, Take 2,000 Int'l Units by mouth daily.  .  citalopram (CELEXA) 40 MG tablet, Take 1 tablet (40 mg total) by mouth daily. .  Halcinonide (HALOG) 0.1 % CREA, Apply 1 application topically daily. .  Magnesium 500 MG TABS, Take 1 tablet by mouth daily. Marland Kitchen  MILK THISTLE PO, Take 1 capsule by mouth daily. .  Misc Natural Products (TART CHERRY ADVANCED PO), Take 1 capsule by mouth daily. .  Multiple Vitamins-Minerals (MULTIVITAMIN WITH MINERALS) tablet, Take 1 tablet by mouth daily. .  NON FORMULARY, Take 2 capsules by mouth daily. .  NON FORMULARY, Take 2  capsules by mouth daily. .  Omega-3 Fatty Acids (FISH OIL) 1000 MG CPDR, Take 2,000 mg by mouth daily. Marland Kitchen  PHYTOSTEROL ESTERS PO, Take 2 tablets by mouth daily. .  valACYclovir (VALTREX) 1000 MG tablet, TAKE 2 TABLETS AT ONSET OF COLD SORE. REPEAT ONCE IN 12 HOURS (4 PILLS/COURSE) .  Vitamin D, Ergocalciferol, (DRISDOL) 1.25 MG (50000 UNIT) CAPS capsule, Take 1 capsule (50,000 Units total) by mouth every 7 (seven) days.   Reviewed prior external information including notes and imaging from  primary care provider As well as notes that were available from care everywhere and other healthcare systems.  Past medical history, social, surgical and family history all reviewed in electronic medical record.  No pertanent information unless stated regarding to the chief complaint.   Review of Systems:  No headache, visual changes, nausea, vomiting, diarrhea, constipation, dizziness, abdominal pain, skin rash, fevers, chills, night sweats, weight loss, swollen lymph nodes, body aches, joint swelling, chest pain, shortness of breath, mood changes. POSITIVE muscle aches  Objective  Last menstrual period 11/18/2008.   General: No apparent distress alert and oriented x3 mood and affect normal, dressed appropriately.  HEENT: Pupils equal, extraocular movements intact  Respiratory: Patient's speak in full sentences and does not appear short of breath  Cardiovascular: No lower extremity edema, non tender, no erythema  Gait normal with good balance and coordination.  MSK:  Non tender with full range of motion and good stability and symmetric strength and tone of shoulders, elbows, wrist, hip, knee and ankles bilaterally.     Impression and Recommendations:     The above documentation has been reviewed and is accurate and complete Lyndal Pulley, DO

## 2021-01-01 ENCOUNTER — Ambulatory Visit: Payer: 59 | Admitting: Family Medicine

## 2021-01-09 ENCOUNTER — Other Ambulatory Visit: Payer: Self-pay | Admitting: Family Medicine

## 2021-01-09 NOTE — Progress Notes (Signed)
[facility] Phone: [phone] Subjective:   Rachel Juarez, am serving as a scribe for Dr. Hulan Saas. This visit occurred during the SARS-CoV-2 public health emergency.  Safety protocols were in place, including screening questions prior to the visit, additional usage of staff PPE, and extensive cleaning of exam room while observing appropriate contact time as indicated for disinfecting solutions.   I'm seeing this patient by the request  of:  Rita Ohara, MD  CC: Right foot pain follow-up  EPP:[id number]   11/14/2020 Patient does have the talar dome fracture but seems to be doing well.  Juarez significant effusion of the ankle noted.  Patient does have some very mild anterior drawer test noted.  Patient will start with formal physical therapy.  We discussed again about the potential for other things such as referring to orthopedic surgery but patient is making improvement and would like to avoid any type of surgical intervention.  We will have patient start with physical therapy see me again 6 to 8 weeks  Update 01/10/2021 Rachel Juarez is a 65 y.o. female coming in with complaint of right foot pain. Has been doing PT with Barbaraann Barthel. Patient states that she has intermittent pain on lateral ankle that radiates up into the knee. Physical therapy has been helping somewhat. Is sore after PT. Overall is better than she has been.  Patient states having Juarez pain at all with daily activities, has not noticed any significant swelling.  MRI of the foot and ankle taking November date 2021 showed that patient did have a bone contusion but also an avulsion fracture of the dorsal margin of the distal talus, complete tear of the ATFL noted.    Past Medical History:  Diagnosis Date  . Allergy   . Anxiety   . Depression, major, in remission ([place]) 2006   when daughter dx'd with autoimmune disorder  . Family history of breast  cancer   . Family history of cancer tonsil   . Family history of thyroid cancer   . Generalized anxiety disorder    generalized, and related to mammograms and flying  . Glaucoma suspect    sees Dr. Katy Fitch, has all been okay, sees him qoyr  . Herpes labialis   . Hypercholesterolemia    high HDL, borderline LDL  . Hypothyroidism    treated by Dr. Chalmers Cater  . Multinodular goiter    followed by Dr. Chalmers Cater; u/s q2 yr  . 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 Seasonal allergies    takes singulair year-round; worse in the Fall   Juarez past surgical history on file. Social History   Socioeconomic History  . Marital status: Married    Spouse name: Not on file  . Number of children: Not on file  . Years of education: Not on file  . Highest education level: Not on file  Occupational History  . Occupation: Risk manager  Tobacco Use  . Smoking status: Never Smoker  . Smokeless tobacco: Never Used  Vaping Use  . Vaping Use: Never used  Substance and Sexual Activity  . Alcohol use: Yes    Alcohol/week: 0.0 standard drinks    Comment: 1 vodka soda daily at 6pm (1 shot), 2 on weekends (7-10/wk)  . Drug use: Juarez  . Sexual activity: Yes    Partners: Male    Birth control/protection: Post-menopausal  Other Topics Concern  . Not  on file  Social History Narrative   Pre-COVID, taught yoga at multiple places.    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   Lives with her husband, and she has a daughter--graduated from [employer], lives in [place], in school at [employer], and will then be applying for nursing school  (comes home for her IVIG treatments still)   1 dog (Malti-poo)   Volunteers at the [place] Strain: Not on file  Food Insecurity: Not on file  Transportation Needs: Not on file  Physical Activity: Not on file  Stress: Not on file  Social Connections: Not on file    Allergies  Allergen Reactions  . Demerol [Meperidine] Other (See Comments)    Low bp, passes out   Family History  Problem Relation Age of Onset  . Lupus Mother   . Cancer Father 3       thymic cancer  . COPD Father   . Heart disease Father        CHF  . Asthma Father   . Cancer Sister   . Breast cancer Sister 86       "best kind", lumpectomy and radiation  . Dermatomyositis Daughter 19       juvenile form  . Irritable bowel syndrome Daughter   . Colon cancer Maternal Grandmother        80's  . Cancer Maternal Grandmother   . Hypertension Paternal Grandmother   . Hyperlipidemia Paternal Grandmother   . Thyroid disease Paternal Grandmother        had goiter removed  . Stroke Paternal Grandmother 81  . Hypertension Paternal Grandfather   . Hyperlipidemia Paternal Grandfather   . Heart disease Paternal Grandfather        MI first in his 75's  . Stroke Paternal Grandfather   . Cancer Paternal Uncle        started on tonsils (smoker)  . Breast cancer Paternal Aunt 30  . Breast cancer Cousin 61  . Breast cancer Cousin 45       (the mother of the 20 y/o cousin with br ca  . Thyroid cancer Cousin   . Diabetes Neg Hx   . Ovarian cancer Neg Hx     Current Outpatient Medications (Endocrine & Metabolic):  .  levothyroxine (SYNTHROID, LEVOTHROID) 75 MCG tablet, Take 75 mcg by mouth daily before breakfast.  Current Outpatient Medications (Cardiovascular):  .  rosuvastatin (CRESTOR) 10 MG tablet, Take 1 tablet (10 mg total) by mouth daily.  Current Outpatient Medications (Respiratory):  .  fexofenadine (ALLEGRA) 180 MG tablet, Take 180 mg by mouth daily. .  montelukast (SINGULAIR) 10 MG tablet, TAKE 1 TABLET BY MOUTH EVERYDAY AT BEDTIME .  Triamcinolone Acetonide (NASACORT AQ NA), Place 2 sprays into the nose daily.  Current Outpatient Medications (Analgesics):  .  aspirin 81 MG tablet, Take 81 mg by mouth daily.   Current Outpatient Medications (Other):  Marland Kitchen   ALPRAZolam (XANAX) 0.5 MG tablet, Take 0.5-1 tablets (0.25-0.5 mg total) by mouth 3 (three) times daily as needed for anxiety or sleep. Marland Kitchen  buPROPion (WELLBUTRIN XL) 150 MG 24 hr tablet, Take 1 tablet (150 mg total) by mouth daily. .  Calcium 250 MG CAPS, Take 2 capsules by mouth daily. .  Cholecalciferol (VITAMIN D3) LIQD, Take 2,000 Int'l Units by mouth daily.  .  citalopram (CELEXA) 40 MG tablet, Take  1 tablet (40 mg total) by mouth daily. .  Halcinonide (HALOG) 0.1 % CREA, Apply 1 application topically daily. .  Magnesium 500 MG TABS, Take 1 tablet by mouth daily. Marland Kitchen  MILK THISTLE PO, Take 1 capsule by mouth daily. .  Misc Natural Products (TART CHERRY ADVANCED PO), Take 1 capsule by mouth daily. .  Multiple Vitamins-Minerals (MULTIVITAMIN WITH MINERALS) tablet, Take 1 tablet by mouth daily. .  NON FORMULARY, Take 2 capsules by mouth daily. .  NON FORMULARY, Take 2 capsules by mouth daily. .  Omega-3 Fatty Acids (FISH OIL) 1000 MG CPDR, Take 2,000 mg by mouth daily. Marland Kitchen  PHYTOSTEROL ESTERS PO, Take 2 tablets by mouth daily. .  valACYclovir (VALTREX) 1000 MG tablet, TAKE 2 TABLETS AT ONSET OF COLD SORE. REPEAT ONCE IN 12 HOURS (4 PILLS/COURSE) .  Vitamin D, Ergocalciferol, (DRISDOL) 1.25 MG (50000 UNIT) CAPS capsule, TAKE 1 CAPSULE (50,000 UNITS TOTAL) BY MOUTH EVERY 7 (SEVEN) DAYS   Reviewed prior external information including notes and imaging from  primary care provider As well as notes that were available from care everywhere and other healthcare systems.  Past medical history, social, surgical and family history all reviewed in electronic medical record.  Juarez pertanent information unless stated regarding to the chief complaint.   Review of Systems:  Juarez headache, visual changes, nausea, vomiting, diarrhea, constipation, dizziness, abdominal pain, skin rash, fevers, chills, night sweats, weight loss, swollen lymph nodes, body aches, joint swelling, chest pain, shortness of breath, mood  changes. POSITIVE muscle aches  Objective  Blood pressure 122/74, pulse 92, height 5\' 6"  (1.676 m), weight 166 lb (75.3 kg), last menstrual period 11/18/2008, SpO2 96 %.   General: Juarez apparent distress alert and oriented x3 mood and affect normal, dressed appropriately.  HEENT: Pupils equal, extraocular movements intact  Respiratory: Patient's speak in full sentences and does not appear short of breath  Cardiovascular: Juarez lower extremity edema, non tender, Juarez erythema  Gait normal with good balance and coordination.  MSK: Right ankle exam shows the patient has Juarez significant swelling.  Still lacks some of the dorsi flexion noted.  Patient though is neurovascularly intact.  With full plantarflexion patient does have some pain over the talar dome anteriorly.  Limited musculoskeletal ultrasound was performed and interpreted Lyndal Pulley  Limited ultrasound of patient's ankle shows some mild narrowing of the ankle mortise.  Patient styloid down and still has calcific changes noted that is consistent with the avulsion fracture versus a callus formation that is healing.  Still does not appear to be full healing at this time.  Significant decrease in hypoechoic changes of the ankle joint.  Impression: Tyler fracture with interval healing but still with callus formation noted.    Impression and Recommendations:     The above documentation has been reviewed and is accurate and complete Lyndal Pulley, DO

## 2021-01-10 ENCOUNTER — Encounter: Payer: Self-pay | Admitting: Family Medicine

## 2021-01-10 ENCOUNTER — Other Ambulatory Visit: Payer: Self-pay

## 2021-01-10 ENCOUNTER — Ambulatory Visit: Payer: Self-pay

## 2021-01-10 ENCOUNTER — Ambulatory Visit: Payer: 59 | Admitting: Family Medicine

## 2021-01-10 VITALS — BP 122/74 | HR 92 | Ht 66.0 in | Wt 166.0 lb

## 2021-01-10 DIAGNOSIS — M2550 Pain in unspecified joint: Secondary | ICD-10-CM

## 2021-01-10 DIAGNOSIS — S92144D Nondisplaced dome fracture of right talus, subsequent encounter for fracture with routine healing: Secondary | ICD-10-CM

## 2021-01-10 DIAGNOSIS — M79671 Pain in right foot: Secondary | ICD-10-CM | POA: Diagnosis not present

## 2021-01-10 LAB — COMPREHENSIVE METABOLIC PANEL
ALT: 29 U/L (ref 0–35)
AST: 21 U/L (ref 0–37)
Albumin: 4.4 g/dL (ref 3.5–5.2)
Alkaline Phosphatase: 70 U/L (ref 39–117)
BUN: 13 mg/dL (ref 6–23)
CO2: 28 mEq/L (ref 19–32)
Calcium: 9.5 mg/dL (ref 8.4–10.5)
Chloride: 103 mEq/L (ref 96–112)
Creatinine, Ser: 0.8 mg/dL (ref 0.40–1.20)
GFR: 78.59 mL/min (ref >60.00)
Glucose, Bld: 89 mg/dL (ref 70–99)
Potassium: 4.8 mEq/L (ref 3.5–5.1)
Sodium: 139 mEq/L (ref 135–145)
Total Bilirubin: 0.5 mg/dL (ref 0.2–1.2)
Total Protein: 6.8 g/dL (ref 6.0–8.3)

## 2021-01-10 LAB — CBC WITH DIFFERENTIAL/PLATELET
Basophils Absolute: 0 10*3/uL (ref 0.0–0.1)
Basophils Relative: 0.9 % (ref 0.0–3.0)
Eosinophils Absolute: 0.2 10*3/uL (ref 0.0–0.7)
Eosinophils Relative: 5 % (ref 0.0–5.0)
HCT: 41.6 % (ref 36.0–46.0)
Hemoglobin: 14.3 g/dL (ref 12.0–15.0)
Lymphocytes Relative: 28.8 % (ref 12.0–46.0)
Lymphs Abs: 1.3 10*3/uL (ref 0.7–4.0)
MCHC: 34.3 g/dL (ref 30.0–36.0)
MCV: 100.9 fl — ABNORMAL HIGH (ref 78.0–100.0)
Monocytes Absolute: 0.4 10*3/uL (ref 0.1–1.0)
Monocytes Relative: 9.5 % (ref 3.0–12.0)
Neutro Abs: 2.5 10*3/uL (ref 1.4–7.7)
Neutrophils Relative %: 55.8 % (ref 43.0–77.0)
Platelets: 222 10*3/uL (ref 150.0–400.0)
RBC: 4.12 Mil/uL (ref 3.87–5.11)
RDW: 13.4 % (ref 11.5–15.5)
WBC: 4.5 10*3/uL (ref 4.0–10.5)

## 2021-01-10 LAB — SEDIMENTATION RATE: Sed Rate: 4 mm/hr (ref 0–30)

## 2021-01-10 LAB — TSH: TSH: 1.31 u[IU]/mL (ref 0.35–4.50)

## 2021-01-10 LAB — VITAMIN D 25 HYDROXY (VIT D DEFICIENCY, FRACTURES): VITD: 61.86 ng/mL (ref 30.00–100.00)

## 2021-01-10 NOTE — Patient Instructions (Addendum)
We will get bone stimulator and they will call you Labs today Work with PT See me again in 2-3 months

## 2021-01-10 NOTE — Assessment & Plan Note (Signed)
Patient has an MRI showing the talar dome fracture.  Ultrasound today shows the patient still has a bony callus in this area but does not seem to be completely healed at this time.  We will see with patient potentially being able to get a bone stimulator now with this being greater than 3 months since the injury.  I think that this could help with it.  Patient was started all other conservative therapy including formal physical therapy.  Able to do daily activities with no pain and not taking anything for pain.  Still has some limited dorsiflexion.  Follow-up again in 2 months

## 2021-01-12 LAB — CALCIUM, IONIZED: Calcium, Ion: 5.17 mg/dL (ref 4.8–5.6)

## 2021-01-12 LAB — PTH, INTACT AND CALCIUM
Calcium: 9.5 mg/dL (ref 8.6–10.4)
PTH: 41 pg/mL (ref 14–64)

## 2021-01-18 ENCOUNTER — Other Ambulatory Visit: Payer: Self-pay

## 2021-01-18 ENCOUNTER — Ambulatory Visit: Payer: Self-pay

## 2021-01-18 ENCOUNTER — Encounter: Payer: Self-pay | Admitting: Family Medicine

## 2021-01-18 ENCOUNTER — Ambulatory Visit: Payer: 59 | Admitting: Family Medicine

## 2021-01-18 ENCOUNTER — Ambulatory Visit (INDEPENDENT_AMBULATORY_CARE_PROVIDER_SITE_OTHER): Payer: 59

## 2021-01-18 VITALS — BP 92/64 | HR 85 | Ht 66.0 in | Wt 171.0 lb

## 2021-01-18 DIAGNOSIS — M79644 Pain in right finger(s): Secondary | ICD-10-CM

## 2021-01-18 DIAGNOSIS — S62524A Nondisplaced fracture of distal phalanx of right thumb, initial encounter for closed fracture: Secondary | ICD-10-CM | POA: Diagnosis not present

## 2021-01-18 NOTE — Patient Instructions (Signed)
Thank you for coming in today.  Take it easy.   Go ahead and order a size 5.5 and 5 stax splint now.   Recheck in 2 weeks.  Bring both splints to the next visit.   Let me know if you are not feeling well.   OTC medicines for pain are ok.

## 2021-01-18 NOTE — Progress Notes (Signed)
   I, Wendy Poet, LAT, ATC, am serving as scribe for Dr. Lynne Leader.  Rachel Juarez is a 65 y.o. female who presents to [place] at [facility] today for R thumb pain since Tuesday when she slammed her thumb in the car door.  She was last seen by Dr. Tamala Julian on 01/10/21 for R foot pain.  Since then, pt reports R thumb pain that began on Tuesday after slamming her thumb in her car door jamb.  She locates her pain to her R distal phalanx.  Radiating pain: No R thumb swelling: yes at the distal phalanx Aggravating factors: pressure to her R distal thumb Treatments tried: ice; Advil   Pertinent review of systems: No fevers or chills  Relevant historical information: Depression.  Talar dome fracture   Exam:  BP 92/64 (BP Location: Left Arm, Patient Position: Sitting, Cuff Size: Normal)   Pulse 85   Ht 5\' 6"  (1.676 m)   Wt 171 lb (77.6 kg)   LMP 11/18/2008   SpO2 97%   BMI 27.60 kg/m  General: Well Developed, well nourished, and in no acute distress.   MSK: Right thumb subungual hematoma present.  Large hematoma volar aspect of the thumb. Decreased motion IP joint.  Tender palpation. Intact strength to extension and flexion IP joint.  No ulnar or radial deviation IP joint.    Lab and Radiology Results  X-ray images right thumb obtained today personally independently interpreted. Linear fracture through the distal phalanx not involving the joint.  Tuft type fracture.  Fracture from the tip of the distal phalanx to the near the base of the distal phalanx Await formal radiology review     Assessment and Plan: 65 y.o. female with right tuff fracture thumb.  Treat with Stax splint.  Return in 2 weeks for recheck reevaluation. Will not decompress the subungual hematoma as that would potentially cause an open fracture.  Recommend prescription strength NSAIDs.  PDMP not reviewed this encounter. Orders Placed This Encounter  Procedures  . DG Finger  Thumb Right    Standing Status:   Future    Number of Occurrences:   1    Standing Expiration Date:   01/18/2022    Order Specific Question:   Reason for Exam (SYMPTOM  OR DIAGNOSIS REQUIRED)    Answer:   eval poss fx right thumb    Order Specific Question:   Preferred imaging location?    Answer:   [facility]   No orders of the defined types were placed in this encounter.    Discussed warning signs or symptoms. Please see discharge instructions. Patient expresses understanding.   The above documentation has been reviewed and is accurate and complete Lynne Leader, M.D.

## 2021-01-19 NOTE — Progress Notes (Signed)
X-ray right thumb shows fracture in the thumb like we discussed in clinic.

## 2021-01-22 ENCOUNTER — Telehealth: Payer: Self-pay

## 2021-01-22 NOTE — Telephone Encounter (Signed)
Patient was put in a Stax splint for her thumb and was told she needed a smaller splint and told she could find one on [place] or at a medical supply store. [employer] is a two week out delivery and she cannot find one at any supply store. Patient wondering if she could just get one from [place] and pick it up here.

## 2021-01-23 ENCOUNTER — Ambulatory Visit: Payer: 59 | Admitting: Physical Therapy

## 2021-01-23 ENCOUNTER — Encounter: Payer: Self-pay | Admitting: Dermatology

## 2021-01-23 ENCOUNTER — Ambulatory Visit: Payer: 59 | Admitting: Dermatology

## 2021-01-23 VITALS — Ht 66.0 in | Wt 171.0 lb

## 2021-01-23 DIAGNOSIS — L821 Other seborrheic keratosis: Secondary | ICD-10-CM

## 2021-01-23 DIAGNOSIS — S62511A Displaced fracture of proximal phalanx of right thumb, initial encounter for closed fracture: Secondary | ICD-10-CM | POA: Diagnosis not present

## 2021-01-23 DIAGNOSIS — L820 Inflamed seborrheic keratosis: Secondary | ICD-10-CM | POA: Diagnosis not present

## 2021-01-23 DIAGNOSIS — D485 Neoplasm of uncertain behavior of skin: Secondary | ICD-10-CM

## 2021-01-23 DIAGNOSIS — Z1283 Encounter for screening for malignant neoplasm of skin: Secondary | ICD-10-CM

## 2021-01-23 DIAGNOSIS — D1801 Hemangioma of skin and subcutaneous tissue: Secondary | ICD-10-CM | POA: Diagnosis not present

## 2021-01-23 DIAGNOSIS — L918 Other hypertrophic disorders of the skin: Secondary | ICD-10-CM | POA: Diagnosis not present

## 2021-01-23 DIAGNOSIS — S62524A Nondisplaced fracture of distal phalanx of right thumb, initial encounter for closed fracture: Secondary | ICD-10-CM

## 2021-01-23 DIAGNOSIS — T148XXA Other injury of unspecified body region, initial encounter: Secondary | ICD-10-CM

## 2021-01-23 NOTE — Telephone Encounter (Signed)
Yes she can schedule a nurse visit effectively to have a new STAX splint

## 2021-01-23 NOTE — Telephone Encounter (Signed)
Patient has been added to the schedule for today at 12:30.

## 2021-01-23 NOTE — Progress Notes (Signed)
Pt came in to check on the option of switching to a smaller Stax splint for her R distal phalanx thumb fracture.  She was initially placed in a size 6 splint but now that her swelling has decreased, that splint seems a bit loose.  Pt fitted for a size 5.5 and that pt likes the fit and prefers this size over the initial size 6 splint.  Pt then asks about a golfing class that she signed up for that is to begin on March 8th.  We discuss what the class entails and I advise pt to try some things at home first in terms of putting and swinging the club to determine if these things cause any pain.  If not, then she can give the class a try on March 8th.  If her practice trials at home do cause pain, then she should hold off on the golf class.  Advise pt that I will discuss this with Dr. Georgina Snell and if anything changes, I will call her.  Pt is to f/u w/ Dr. Georgina Snell on 02/01/21.

## 2021-01-23 NOTE — Patient Instructions (Signed)

## 2021-01-29 ENCOUNTER — Encounter: Payer: Self-pay | Admitting: Family Medicine

## 2021-01-30 ENCOUNTER — Encounter: Payer: Self-pay | Admitting: Physical Therapy

## 2021-01-31 NOTE — Progress Notes (Signed)
   I, Wendy Poet, LAT, ATC, am serving as scribe for Dr. Lynne Leader.  Rachel Juarez is a 65 y.o. female who presents to [place] at [facility] today for f/u of R distal thumb fx she sustained on 01/16/21 when she slammed her thumb in the car door.  She was last seen by Dr. Georgina Snell on 01/18/21 and was placed in a Stax splint (size 6).  She was later provided a smaller, size 5.5 Stax splint on 01/23/21.  Since then, pt reports that the size 5.5 splint was too small so has gone back to the size 6.  She states that her R thumb con't to improve but feels that the nail is her biggest issue.  She notes that the skin at the base of the thumbnail is a little bit red.  She notes is not particularly tender but she is worried that she may be getting an infection.  Diagnostic imaging: R thumb XR- 01/18/21   Pertinent review of systems: No fevers or chills  Relevant historical information: History of depression   Exam:  BP 104/78 (BP Location: Right Arm, Patient Position: Sitting, Cuff Size: Normal)   Pulse 88   Ht 5\' 6"  (1.676 m)   Wt 169 lb 12.8 oz (77 kg)   LMP 11/18/2008   SpO2 97%   BMI 27.41 kg/m  General: Well Developed, well nourished, and in no acute distress.   MSK: Right thumb slightly swollen.  Subungual hematoma present.  Mild redness skin base of the thumbnail.  Not particularly tender to palpation.  No fluctuance present.    Lab and Radiology Results  X-ray images right thumb obtained today personally and independently interpreted Nondisplaced fracture through the distal phalanx longitudinally.  No significant changes compared to prior x-ray. Await formal radiology review    Assessment and Plan: 65 y.o. female with tuft fracture with subungual hematoma.  No paronychia at this time.  Plan for continued immobilization with Stax splint.  Prescribed doxycycline with instructions on when to start taking should patient develop paronychia.  Recheck in 1  month.   PDMP not reviewed this encounter. Orders Placed This Encounter  Procedures  . DG Finger Thumb Right    Standing Status:   Future    Number of Occurrences:   1    Standing Expiration Date:   03/04/2021    Order Specific Question:   Reason for Exam (SYMPTOM  OR DIAGNOSIS REQUIRED)    Answer:   R thumb pain    Order Specific Question:   Preferred imaging location?    Answer:   [facility]   Meds ordered this encounter  Medications  . doxycycline (VIBRA-TABS) 100 MG tablet    Sig: Take 1 tablet (100 mg total) by mouth 2 (two) times daily for 7 days.    Dispense:  14 tablet    Refill:  0     Discussed warning signs or symptoms. Please see discharge instructions. Patient expresses understanding.   The above documentation has been reviewed and is accurate and complete Lynne Leader, M.D.

## 2021-02-01 ENCOUNTER — Ambulatory Visit (INDEPENDENT_AMBULATORY_CARE_PROVIDER_SITE_OTHER): Payer: 59

## 2021-02-01 ENCOUNTER — Other Ambulatory Visit: Payer: Self-pay

## 2021-02-01 ENCOUNTER — Encounter: Payer: Self-pay | Admitting: Family Medicine

## 2021-02-01 ENCOUNTER — Encounter: Payer: Self-pay | Admitting: Dermatology

## 2021-02-01 ENCOUNTER — Ambulatory Visit: Payer: 59 | Admitting: Family Medicine

## 2021-02-01 VITALS — BP 104/78 | HR 88 | Ht 66.0 in | Wt 169.8 lb

## 2021-02-01 DIAGNOSIS — S62524A Nondisplaced fracture of distal phalanx of right thumb, initial encounter for closed fracture: Secondary | ICD-10-CM

## 2021-02-01 MED ORDER — DOXYCYCLINE HYCLATE 100 MG PO TABS: 100.0000 mg | ORAL_TABLET | Freq: Two times a day (BID) | ORAL | 0 refills | Status: AC

## 2021-02-01 NOTE — Patient Instructions (Signed)
Thank you for coming in today.  Continue STAX splint.  Recheck in 1 month.  I do not anticipate xrays in 1 month if you are improving.   If you think you are getting an infection let me know and start the doxycycline antibiotic.  DO not take with calcium or iron. OK to take with a little food without dairy.   Use sunscreen with doxycycline because it increases your risk of sunburn.   I think you will do fine.

## 2021-02-01 NOTE — Progress Notes (Signed)
Follow-Up Visit   Subjective  Rachel Juarez is a 65 y.o. female who presents for the following: Annual Exam (Full body skin check. Risen dark spot on right crease on groin x years, changing shape, itches the last 6 months./Spot on right thigh that bleeds when shaving, x years, per patient spot has continued to get larger.).  General skin check, several areas on right upper leg have changed Location:  Duration:  Quality:  Associated Signs/Symptoms: Modifying Factors:  Severity:  Timing: Context:   Objective  Well appearing patient in no apparent distress; mood and affect are within normal limits. Objective  Chest - Medial [facility]): Full body skin check: No atypical moles .  Objective  Left Lower Leg - Posterior, Mid Back, Right Lower Leg - Posterior: Noninflamed 4 to 6 mm brown flattopped textured papules  Objective  Chest - Medial (Center), Right Upper Back: Back, abdomen, mid chest: Multiple 2 to 3 mm smooth red papules  Objective  Right Thumb Proximal Nail Fold: Previous ER visit healing shut finger in car door  Objective  Suprasternal Area: Pedunculated 2 mm papules: Tags on neck no treatment  Objective  Right Medial Thigh: 8 mm inflamed pinkbrown papule, I SK versus SCCA.  Objective  Right Thigh Superior: Inflamed pink-brown 6 mm crust, favor I SK over SCCA.  Objective  Right Thigh Inferior: Slightly inflamed 5 mm red papule, traumatized angioma.   A full examination was performed including scalp, head, eyes, ears, nose, lips, neck, chest, axillae, abdomen, back, buttocks, bilateral upper extremities, bilateral lower extremities, hands, feet, fingers, toes, fingernails, and toenails. All findings within normal limits unless otherwise noted below.  Areas beneath undergarments not examined.   Assessment & Plan    Screening exam for skin cancer Chest - Medial [facility])  Annual skin check, encouraged to self examine twice annually.  Continued ultraviolet  protection.  Seborrheic keratosis (3) Left Lower Leg - Posterior; Right Lower Leg - Posterior; Mid Back  Intervention not indicated unless clinically change.  Cherry angioma (2) Chest - Medial [facility]); Right Upper Back  No treatment necessary.  Fracture Right Thumb Proximal Nail Fold  Skin tag Suprasternal Area  Leave be  Neoplasm of uncertain behavior of skin (3) Right Medial Thigh  Skin / nail biopsy Type of biopsy: tangential   Informed consent: discussed and consent obtained   Timeout: patient name, date of birth, surgical site, and procedure verified   Anesthesia: the lesion was anesthetized in a standard fashion   Anesthetic:  1% lidocaine w/ epinephrine 1-100,000 local infiltration Instrument used: flexible razor blade   Hemostasis achieved with: aluminum chloride and electrodesiccation   Outcome: patient tolerated procedure well   Post-procedure details: wound care instructions given    Specimen 1 - Surgical pathology Differential Diagnosis: atypia  Check Margins: No  Right Thigh Superior  Skin / nail biopsy Type of biopsy: tangential   Informed consent: discussed and consent obtained   Timeout: patient name, date of birth, surgical site, and procedure verified   Procedure prep:  Patient was prepped and draped in usual sterile fashion (Non sterile) Prep type:  Chlorhexidine Anesthesia: the lesion was anesthetized in a standard fashion   Anesthetic:  1% lidocaine w/ epinephrine 1-100,000 local infiltration Instrument used: flexible razor blade   Outcome: patient tolerated procedure well   Post-procedure details: wound care instructions given    Specimen 2 - Surgical pathology Differential Diagnosis: seb k  Check Margins: No  Right Thigh Inferior  Skin / nail biopsy Type  of biopsy: tangential   Informed consent: discussed and consent obtained   Timeout: patient name, date of birth, surgical site, and procedure verified   Procedure prep:  Patient  was prepped and draped in usual sterile fashion (Non sterile) Prep type:  Chlorhexidine Anesthesia: the lesion was anesthetized in a standard fashion   Anesthetic:  1% lidocaine w/ epinephrine 1-100,000 local infiltration Instrument used: flexible razor blade   Outcome: patient tolerated procedure well   Post-procedure details: wound care instructions given    Specimen 3 - Surgical pathology Differential Diagnosis: angioma  Check Margins: No     I, Lavonna Monarch, MD, have reviewed all documentation for this visit.  The documentation on 02/01/21 for the exam, diagnosis, procedures, and orders are all accurate and complete.

## 2021-02-02 NOTE — Progress Notes (Signed)
Fracture of the thumb with similar appearance to previous.  It is still broken but nondisplaced.

## 2021-02-08 ENCOUNTER — Other Ambulatory Visit: Payer: Self-pay | Admitting: Family Medicine

## 2021-02-08 DIAGNOSIS — F321 Major depressive disorder, single episode, moderate: Secondary | ICD-10-CM

## 2021-02-08 DIAGNOSIS — F411 Generalized anxiety disorder: Secondary | ICD-10-CM

## 2021-02-08 NOTE — Telephone Encounter (Signed)
Is this okay to refill? 

## 2021-02-16 LAB — HM MAMMOGRAPHY

## 2021-02-19 ENCOUNTER — Encounter: Payer: Self-pay | Admitting: *Deleted

## 2021-02-20 ENCOUNTER — Encounter: Payer: Self-pay | Admitting: Family Medicine

## 2021-02-21 ENCOUNTER — Encounter: Payer: Self-pay | Admitting: Family Medicine

## 2021-02-21 NOTE — Progress Notes (Signed)
Chief Complaint  Patient presents with  . Rash    In cracks in lips x 2 weeks. Took 2 doses of valtrex. Needs refill of wellbutrin-has CPE in 2 weeks.     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 However, the cold sores can migrate off my lip to my face, right off the corner of my mouth or onto my jaw area. I have that right now on both corners of my mouth and it is unsightly and itchy and stings sometimes. I assume it is related to the cold sores because the cold sore on the lip happens first. Is there anything you can prescribe topically to help it heal? I have tried Vaseline, Bag Balm and Benadryl cream. I also tried another round of the Valacyclovir."  She reports she has been getting cold sores more frequently than in the past, needing valtrex once every 6 weeks. She tends to get the rash at the corners of her mouth after she gets an [place]. It is itchy.  She reports it has significantly improved since she sent the message (and almost canceled appointment).     PMH, PSH, SH reviewed  Outpatient Encounter Medications as of 02/22/2021  Medication Sig Note  . aspirin 81 MG tablet Take 81 mg by mouth daily.   . Calcium 250 MG CAPS Take 2 capsules by mouth daily. 01/26/2020: Takes 1 calcium tablet daily (unsure of dose)  . citalopram (CELEXA) 40 MG tablet TAKE 1 TABLET BY MOUTH EVERY DAY   . fexofenadine (ALLEGRA) 180 MG tablet Take 180 mg by mouth daily.   . hydrocortisone 2.5 % ointment Apply topically 2 (two) times daily.   Marland Kitchen levothyroxine (SYNTHROID, LEVOTHROID) 75 MCG tablet Take 75 mcg by mouth daily before breakfast.   . Magnesium 500 MG TABS Take 1 tablet by mouth daily.   Marland Kitchen MILK THISTLE PO Take 1 capsule by mouth daily.   . Misc Natural Products (TART CHERRY ADVANCED PO) Take 1 capsule by mouth daily.   . montelukast (SINGULAIR) 10 MG tablet TAKE 1 TABLET BY MOUTH EVERYDAY AT BEDTIME   .  Multiple Vitamins-Minerals (MULTIVITAMIN WITH MINERALS) tablet Take 1 tablet by mouth daily.   . mupirocin ointment (BACTROBAN) 2 % Apply 1 application topically 2 (two) times daily.   . NON FORMULARY Take 2 capsules by mouth daily. 01/26/2020: DoTerra On Guard  . NON FORMULARY Take 2 capsules by mouth daily. 01/26/2020: DoTerra Zendocrine  . Omega-3 Fatty Acids (FISH OIL) 1000 MG CPDR Take 2,000 mg by mouth daily.   . rosuvastatin (CRESTOR) 10 MG tablet Take 1 tablet (10 mg total) by mouth daily.   . Triamcinolone Acetonide (NASACORT AQ NA) Place 2 sprays into the nose daily.   . vitamin C (ASCORBIC ACID) 500 MG tablet Take 500 mg by mouth daily.   . Vitamin D, Ergocalciferol, (DRISDOL) 1.25 MG (50000 UNIT) CAPS capsule TAKE 1 CAPSULE (50,000 UNITS TOTAL) BY MOUTH EVERY 7 (SEVEN) DAYS   . [DISCONTINUED] buPROPion (WELLBUTRIN XL) 150 MG 24 hr tablet Take 1 tablet (150 mg total) by mouth daily.   . [DISCONTINUED] PHYTOSTEROL ESTERS PO Take 2 tablets by mouth daily. 08/02/2020: 2 capsules with meals (4-6/day)  . ALPRAZolam (XANAX) 0.5 MG tablet Take 0.5-1 tablets (0.25-0.5 mg total) by mouth 3 (three) times daily as needed for anxiety or sleep. (Patient not taking: Reported on 02/22/2021)   . buPROPion (WELLBUTRIN XL) 150  MG 24 hr tablet Take 1 tablet (150 mg total) by mouth daily.   . Cholecalciferol (VITAMIN D3) LIQD Take 2,000 Int'l Units by mouth daily. (Patient not taking: Reported on 02/22/2021) 08/02/2020: Stopped taking this in 12/2019, only taking MVI  . valACYclovir (VALTREX) 1000 MG tablet TAKE 2 TABLETS AT ONSET OF COLD SORE. REPEAT ONCE IN 12 HOURS (4 PILLS/COURSE) (Patient not taking: Reported on 02/22/2021)   . [DISCONTINUED] Halcinonide (HALOG) 0.1 % CREA Apply 1 application topically daily.    No facility-administered encounter medications on file as of 02/22/2021.   (NOT using hydrocortisone or bactroban prior to today)  Allergies  Allergen Reactions  . Demerol [Meperidine] Other (See  Comments)    Low bp, passes out    ROS: no fever, chills, URI symptoms or other concerns, except as noted in HPI  PHYSICAL EXAM:  BP 110/78   Pulse 80   Temp 98 F (36.7 C) (Tympanic)   Ht 5\' 6"  (1.676 m)   Wt 167 lb 6.4 oz (75.9 kg)   LMP 11/18/2008   BMI 27.02 kg/m   Well-appearing, pleasant female, in good spirits. She is alert and oriented.  She is frequently using her tongue to lick the corners of her mouth during visit.  Erythema and slightly scaly at the corners of both sides of her mouth. Not involving  just the skin fold, but extending superiorly also.  ASSESSMENT/PLAN:  Angular cheilitis - Plan: hydrocortisone 2.5 % ointment, mupirocin ointment (BACTROBAN) 2 %  Depression, major, single episode, moderate (HCC) - cont current meds and counseling. Declines changes at this time - Plan: buPROPion (WELLBUTRIN XL) 150 MG 24 hr tablet  Advised how to use HC 2.5% and bactroban BID (mix together vs using separately, both BID). If not effective, will add nystatin to cover fungal infection.  We discussed possibility of considering daily use of Valtrex for prevention ([phone] mg daily)  Wellbutrin refilled--didn't have enough to last until her next visit.  F/u as scheduled.

## 2021-02-22 ENCOUNTER — Encounter: Payer: Self-pay | Admitting: Family Medicine

## 2021-02-22 ENCOUNTER — Ambulatory Visit (INDEPENDENT_AMBULATORY_CARE_PROVIDER_SITE_OTHER): Payer: 59 | Admitting: Family Medicine

## 2021-02-22 ENCOUNTER — Other Ambulatory Visit: Payer: Self-pay

## 2021-02-22 VITALS — BP 110/78 | HR 80 | Temp 98.0°F | Ht 66.0 in | Wt 167.4 lb

## 2021-02-22 DIAGNOSIS — K130 Diseases of lips: Secondary | ICD-10-CM | POA: Diagnosis not present

## 2021-02-22 DIAGNOSIS — F321 Major depressive disorder, single episode, moderate: Secondary | ICD-10-CM | POA: Diagnosis not present

## 2021-02-22 MED ORDER — MUPIROCIN 2 % EX OINT: 1.0000 "application " | TOPICAL_OINTMENT | Freq: Two times a day (BID) | CUTANEOUS | 0 refills | Status: AC

## 2021-02-22 MED ORDER — HYDROCORTISONE 2.5 % EX OINT
TOPICAL_OINTMENT | Freq: Two times a day (BID) | CUTANEOUS | 0 refills | Status: DC
Start: 2021-02-22 — End: 2024-06-17

## 2021-02-22 MED ORDER — BUPROPION HCL ER (XL) 150 MG PO TB24: 150.0000 mg | ORAL_TABLET | Freq: Every day | ORAL | 0 refills | Status: DC

## 2021-02-22 NOTE — Patient Instructions (Signed)
Apply the two medications to the affected area near your mouth twice daily until better.  (either apply together, mixing in your hand before applying, or separately) If it is isn't improving, then there may be a fungal component, and we should add an antifungal (prescription Nystatin vs over-the-counter antifungal).

## 2021-02-28 IMAGING — DX DG ANKLE COMPLETE 3+V*R*
3 series · 3 of 3 positions shown · non-contrast
Comparison: X-ray right ankle 09/11/2020, ultrasound extremity 1[REDACTED].

CLINICAL DATA: Right ankle pain, follow-up fracture

EXAM:
RIGHT ANKLE - COMPLETE 3+ VIEW

[ankle ap]
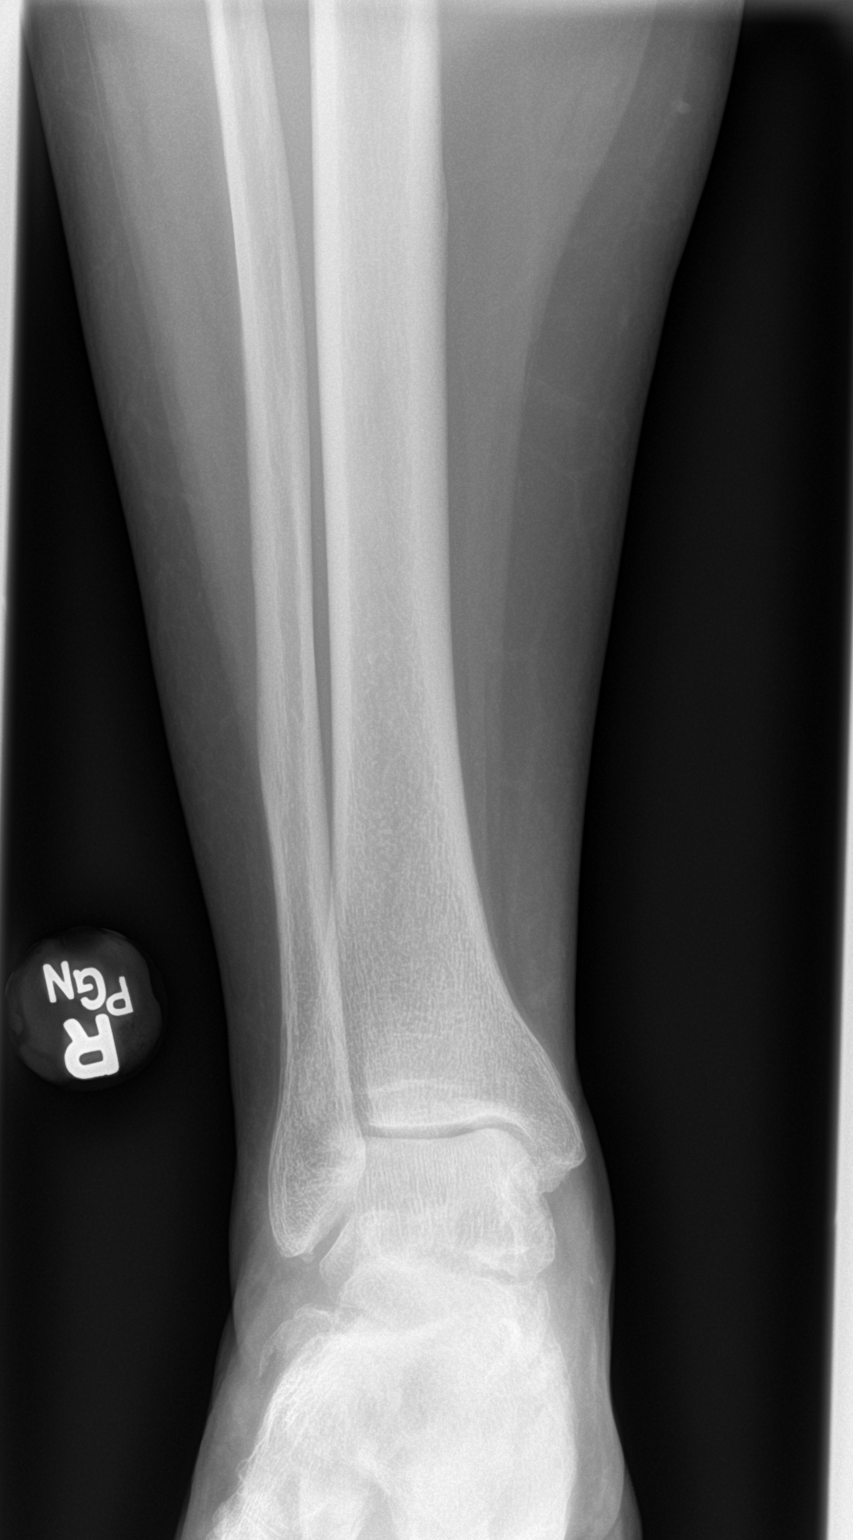

[ankle obl]
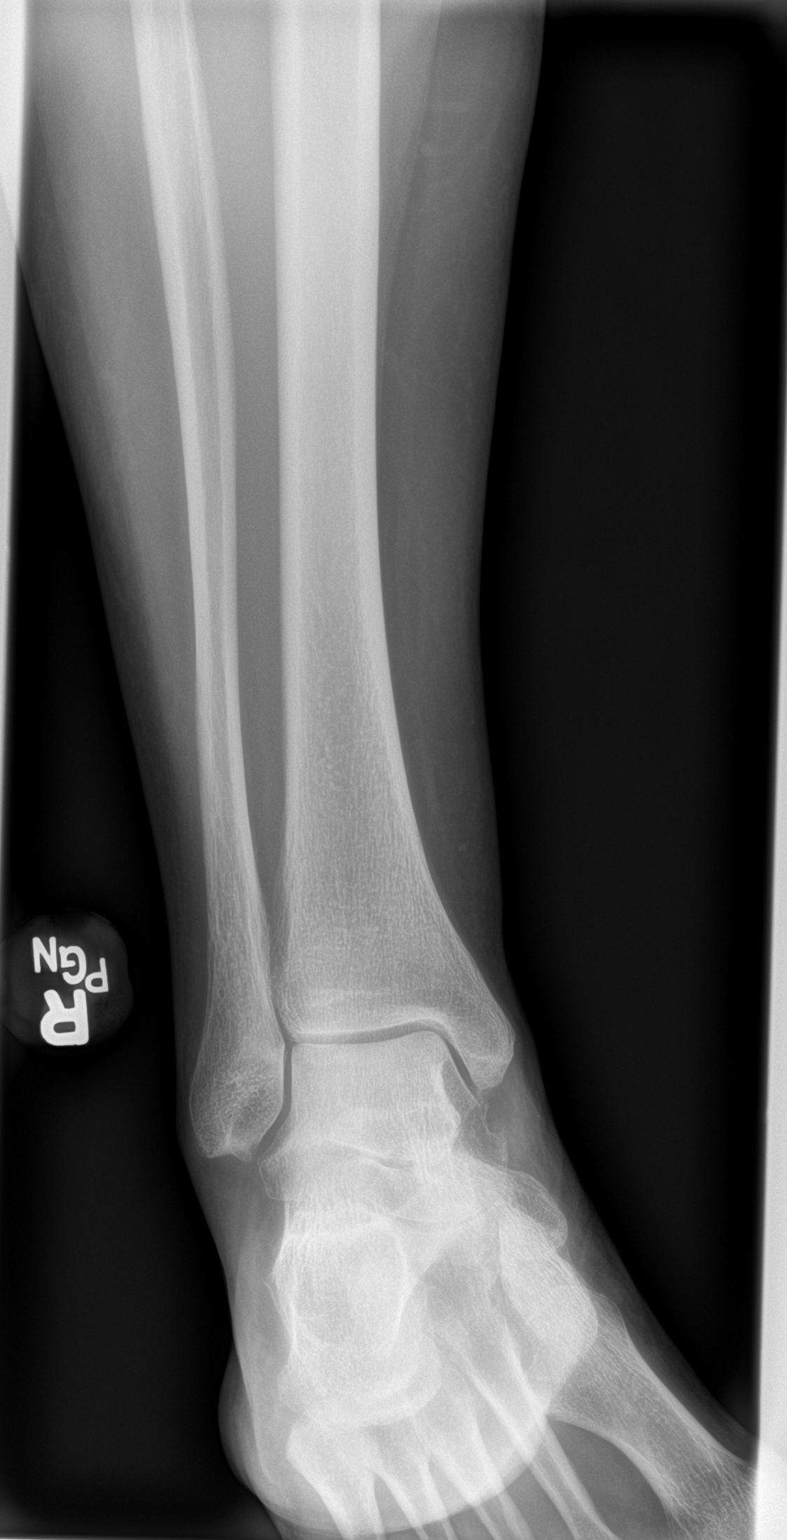

[ankle lat]
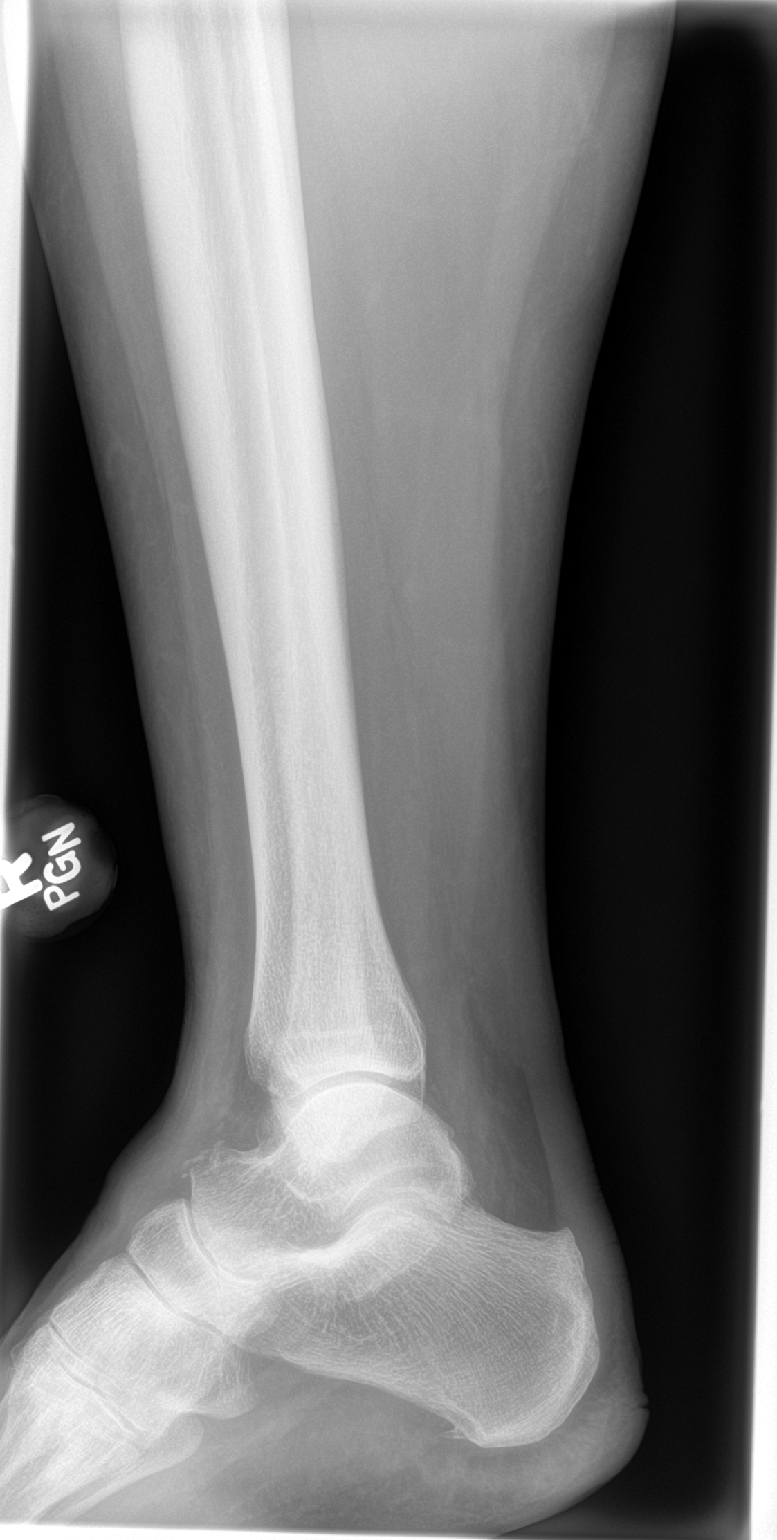

[3 of 3 positions shown; findings below may reference images not displayed]

FINDINGS: Redemonstration of a subacute talar head fracture.

Redemonstration of a 1.6 cm fracture fragment along the lateral foot
likely arising from the lateral process of the talus.

Persistent associated subcutaneus soft tissue edema.
IMPRESSION: 1. Healing subacute talar head fracture that likely extends to the
neck of the talus.
2. Redemonstration of a 1.6 cm fracture fragment likely arising from
a lateral process of the talus fracture. Consider CT noncontrast of
the right ankle for further evaluation.
3. Persistent but improved subcutaneous tissue edema.

These results will be called to the ordering clinician or
representative by the Radiologist Assistant, and communication
documented in the PACS or [REDACTED].

## 2021-03-02 NOTE — Progress Notes (Signed)
   I, Peterson Lombard, LAT, ATC acting as a scribe for Lynne Leader, MD.  Rachel Juarez is a 65 y.o. female who presents to [place] at [facility] today for f/u of R distal thumb fx she sustained on 01/16/21 when she slammed her thumb in the car door. Pt was last seen by Dr. Georgina Snell on 02/01/21  and was advised to cont immobilization w/ Stax splint and was prescribed doxycycline to start taking is paronychia developed. Today, pt reports the nail is bothering her. Pt admits to being non-compliant about wearing the stax splint.   Dx imaging: 02/01/21 R thumb XR  01/18/21 R thumb XR  Pertinent review of systems: No fevers or chills  Relevant historical information: History of depression.  History of goiter.   Exam:  Ht 5' 5.5" (1.664 m)   Wt 169 lb 3.2 oz (76.7 kg)   LMP 11/18/2008   BMI 27.73 kg/m  General: Well Developed, well nourished, and in no acute distress.   MSK: Right thumb subungual hematoma partially dystrophic nail present.  Thumb otherwise normal-appearing Thumb nontender with normal motion and strength.    Lab and Radiology Results  X-ray images right thumb obtained today personally and independently interpreted  No fracture visible Await formal radiology review    Assessment and Plan: 65 y.o. female with right thumb fracture now resolved.  Resume activity as tolerated.  The nail will eventually normalize.  Recheck back as needed.   PDMP not reviewed this encounter. Orders Placed This Encounter  Procedures  . DG Finger Thumb Right    Standing Status:   Future    Number of Occurrences:   1    Standing Expiration Date:   03/05/2022    Order Specific Question:   Reason for Exam (SYMPTOM  OR DIAGNOSIS REQUIRED)    Answer:   thumb fracture    Order Specific Question:   Preferred imaging location?    Answer:   [facility]   No orders of the defined types were placed in this encounter.    Discussed warning signs or symptoms.  Please see discharge instructions. Patient expresses understanding.   The above documentation has been reviewed and is accurate and complete Lynne Leader, M.D.

## 2021-03-04 ENCOUNTER — Other Ambulatory Visit: Payer: Self-pay | Admitting: Family Medicine

## 2021-03-04 DIAGNOSIS — J302 Other seasonal allergic rhinitis: Secondary | ICD-10-CM

## 2021-03-04 NOTE — Progress Notes (Signed)
Chief Complaint  Patient presents with  . Annual Exam    Fasting annual exam with pap. Will schedule NV for Tdap. Sees Dr.Lyles for eye exams. Could not give urine today. No new concerns.     Rachel Juarez is a 65 y.o. female who presents for a complete physical.  She had labs done in February, ordered by Dr. Charlann Boxer, see below.  Patient was seen recently with angular cheilitis. She reports it cleared up completely shortly after using prescribed medications (2.5% hydrocortisone and mupirocin ointments).  She has been seeing Sports Medicine.  She had fallen down 2 stairs in October 2021, had talar dome fracture and complete tear of ATFL (MRI 09/2020).   She is getting PT from Dr. Leo Grosser and has f/u with Dr. Tamala Julian next week. She denies pain, just working on strengthening the ankle. More recently she injured her R thumb in car door, sustaining a tuft fracture and subungual hematoma on 01/16/21. She has f/u with Dr. Georgina Snell this afternoon. She reports she hasn't been very compliant with wearing the splint. Nail seems to be growing out, just slight discomfort.  Vitamin D deficiency--Last level was 61.86 in February 2022.  This was ordered by Dr. Tamala Julian, who suggested she take 2000-4000 IU (after completing rx vitamin D, which she has been taking while talar dome fracture is healing).  At one point her level was high (126), using too many drops of D3.  She stopped using the drops in 12/2019, took MVI only and level was 44.5 in 07/2020. Prior to the fracture she was taking MVI daily only.  Depression:  She continues on citalopram 34m and Wellbutrin XL 1552mdaily (Wellbutrin was added in 08/2019), as well as counseling. Much of her stress/anxiety has been related to [place] and her husband hadn't been on the same page, but were  "coexisting" fine. 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 At that visit (07/2020), PHQ-9 score was 5, GAD-7 score was 10.  Today she reports-- No longer feeling depressed, but there is not a day where she isn't anxious about something, not "at ease". Not debilitating, but is "constant anxiety", thinks it has likely been like this for much of her life.  [facility] wore [facility]. Forced herself to go, enjoyed it, but would have been just as happy watching it on [employer] + at home. She is happy being at home. Needed xanax the week before her mammogram and another dose that night.  Hyperlipidemia: At her last visit (07/2020) she reported eating a lot of french fries, but fewer eggs. She was eating more cheese--"I get down and I eat cheese" She was taking phytosterols 2 capsules with meals, taking 4-6/day.  Her LDL was up to 185.  She was then started on rosuvastatin 1035maily, and recheck in November was very good.  She is tolerating rosuvastatin without side effects. Current diet:  Eating less cheese, red meat once a week. French fries 1-2x/week, no other fried foods. No eggs.   Recent Labs'[]' Expand by Default       Lab Results  Component Value Date   CHOL 284 (H) 07/26/2020   HDL 86 07/26/2020   LDLCALC 185 (H) 07/26/2020   TRIG  81 07/26/2020   CHOLHDL 3.3 07/26/2020     Lab Results  Component Value Date   CHOL 202 (H) 10/04/2020   HDL 91 10/04/2020   LDLCALC 97 10/04/2020   TRIG 78 10/04/2020   CHOLHDL 2.2 10/04/2020    Hypothyroidism/multinodular goiter--under the care of Dr. Chalmers Cater, seen last week. She hasn't needed any med changes in years. No notesreceived from Dr. Chalmers Cater, sees her once yearly.  Denies any thyroid symptoms, compliant with medications. Planning for another ultrasound and DEXA scan next year.  Herpes labialis--uses Valtrex prn. Last filled in  10/2020, #28. Having more outbreaks than usual for her, about once a month. Medication is effective, still has some at home.  Allergies: They are year-round but also has seasonal component in spring and fall. She takes singulair daily, and nasal steroid (nasacort)and antihistamine (allegra).  Still has some itchy eyes, some sneezing.  Denies sinus pain.  Macrocytosis.previously hadB12 in the330 range. MCV was at 100, prev up to 104 per last records. Pt recalls seeing hematologist in the past. No work-up was needed. She denies symptoms of anemia, denies numbness, tingling or memory concerns. She takes MVI daily. Labs checked by Dr. Tamala Julian in February, see below.   Immunization History  Administered Date(s) Administered  . Influenza,inj,Quad PF,6+ Mos 08/30/2015, 08/21/2020  . Influenza-Unspecified 07/30/2017, 08/24/2018, 08/20/2019  . Moderna Sars-Covid-2 Vaccination 12/07/2019, 01/05/2020, 09/21/2020, 02/28/2021  . Tdap 02/12/2011  . Zoster Recombinat (Shingrix) 03/06/2018, 05/08/2018   She got 2nd COVID booster last week. Last Pap smear:12/2015, normal, no high risk HPV detected Last mammogram: 01/2021 Last colonoscopy: 05/2018 Dr. Watt Climes, hyperplastic polyps. Repeat 10 years. She did hemoccult testing sent to her by her insurance in 01/2021. Last DEXA: 07/2020 T-1.9 L fem neck (improved at R hip, spine and L hip stable) Dentist: twice a year Ophtho: Dr. Katy Fitch, yearly [place] trainer 2x/week, weights and HIIT, brisk walking once a week (3 miles), plus dog walks (frequent stops); gardens daily.  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  Hepatitis C screening--previously donated bloodregularly   PMH, PSH, SH and FH were reviewed and updated Negative genetic testing 10/2019 She continues to drink 2 alcoholic beverages/day, and reports she is unwilling to "give this up" or cut back.   Outpatient Encounter Medications as of 03/05/2021  Medication Sig Note  .  buPROPion (WELLBUTRIN XL) 150 MG 24 hr tablet Take 1 tablet (150 mg total) by mouth daily.   . Calcium 250 MG CAPS Take 2 capsules by mouth daily. 01/26/2020: Takes 1 calcium tablet daily (unsure of dose)  . citalopram (CELEXA) 40 MG tablet TAKE 1 TABLET BY MOUTH EVERY DAY   . fexofenadine (ALLEGRA) 180 MG tablet Take 180 mg by mouth daily.   Marland Kitchen levothyroxine (SYNTHROID, LEVOTHROID) 75 MCG tablet Take 75 mcg by mouth daily before breakfast.   . Magnesium 500 MG TABS Take 1 tablet by mouth daily.   Marland Kitchen MILK THISTLE PO Take 1 capsule by mouth daily.   . Misc Natural Products (TART CHERRY ADVANCED PO) Take 1 capsule by mouth daily.   . montelukast (SINGULAIR) 10 MG tablet TAKE 1 TABLET BY MOUTH EVERYDAY AT BEDTIME   . Multiple Vitamins-Minerals (MULTIVITAMIN WITH MINERALS) tablet Take 1 tablet by mouth daily.   . NON FORMULARY Take 2 capsules by mouth daily. 01/26/2020: DoTerra On Guard  . NON FORMULARY Take 2 capsules by mouth daily. 01/26/2020: DoTerra Zendocrine  . Omega-3 Fatty Acids (FISH OIL) 1000 MG CPDR Take 2,000 mg by mouth daily.   Marland Kitchen  rosuvastatin (CRESTOR) 10 MG tablet Take 1 tablet (10 mg total) by mouth daily.   . Triamcinolone Acetonide (NASACORT AQ NA) Place 2 sprays into the nose daily.   . vitamin C (ASCORBIC ACID) 500 MG tablet Take 500 mg by mouth daily.   . Vitamin D, Ergocalciferol, (DRISDOL) 1.25 MG (50000 UNIT) CAPS capsule TAKE 1 CAPSULE (50,000 UNITS TOTAL) BY MOUTH EVERY 7 (SEVEN) DAYS   . [DISCONTINUED] aspirin 81 MG tablet Take 81 mg by mouth daily.   Marland Kitchen ALPRAZolam (XANAX) 0.5 MG tablet Take 0.5-1 tablets (0.25-0.5 mg total) by mouth 3 (three) times daily as needed for anxiety or sleep. (Patient not taking: No sig reported)   . hydrocortisone 2.5 % ointment Apply topically 2 (two) times daily. (Patient not taking: Reported on 03/05/2021)   . mupirocin ointment (BACTROBAN) 2 % Apply 1 application topically 2 (two) times daily. (Patient not taking: Reported on 03/05/2021)   .  valACYclovir (VALTREX) 1000 MG tablet TAKE 2 TABLETS AT ONSET OF COLD SORE. REPEAT ONCE IN 12 HOURS (4 PILLS/COURSE) (Patient not taking: No sig reported)   . [DISCONTINUED] Cholecalciferol (VITAMIN D3) LIQD Take 2,000 Int'l Units by mouth daily. (Patient not taking: Reported on 02/22/2021) 08/02/2020: Stopped taking this in 12/2019, only taking MVI   No facility-administered encounter medications on file as of 03/05/2021.   Allergies  Allergen Reactions  . Demerol [Meperidine] Other (See Comments)    Low bp, passes out    ROS: The patient denies, fever, weight changes, headaches, vision changes, decreased hearing, ear pain, sore throat, breast concerns, chest pain, palpitations, dizziness, syncope, dyspnea on exertion, cough, swelling, nausea, vomiting, diarrhea, constipation, abdominal pain, melena, hematochezia, indigestion/heartburn, hematuria, incontinence, dysuria, vaginal bleeding, discharge, odor or itch, genital lesions, joint pains, numbness, tingling, weakness, tremor, suspicious skin lesions, abnormal bleeding or enlarged lymph nodes. +easy bruising from aspirin. Pain with intercourse, vaginal dryness; lubricants help. No hot flashes (rare, mild) Some pain and triggering in R 3rd finger, doing a lot better, not needing brace.  R thumb feels better.  R ankle/foot is much better (working on strengthening with PT) Allergies, controlled, some itchy eyes Depression resolved; anxiety per HPI   PHYSICAL EXAM:  BP 108/60   Pulse 72   Ht 5' 5.5" (1.664 m)   Wt 166 lb 9.6 oz (75.6 kg)   LMP 11/18/2008   BMI 27.30 kg/m    Wt Readings from Last 3 Encounters:  02/22/21 167 lb 6.4 oz (75.9 kg)  02/01/21 169 lb 12.8 oz (77 kg)  01/30/21 171 lb (77.6 kg)    General Appearance:  Alert, cooperative, no distress, appears stated age.   Head:  Normocephalic, without obvious abnormality, atraumatic  Eyes:  PERRL, conjunctiva/corneas clear, EOM's intact, fundi benign  Ears:    Cerumen  is present bilaterally, narrow canals.  TM's not well visualized.  Nose: Not examined, wearing mask due to COVID-19 pandemic  Throat: Not examined, wearing mask due to COVID-19 pandemic  Neck: Supple, no lymphadenopathy; thyroid:mildly enlarged, nodular, more prominent on the right;no carotid bruit or JVD  Back:  Spine nontender, no curvature, ROM normal, no CVA tenderness  Lungs:  Clear to auscultation bilaterally without wheezes, rales or ronchi; respirations unlabored  Chest Wall:  No tenderness or deformity  Heart:  Regular rate and rhythm, S1 and S2 normal, no murmur, rub or gallop  Breast Exam:  No dimpling, nipple discharge or inversion. No axillary lymphadenopathy. She has some fibroglandular changes bilaterally, more prominent at L UOQ, only slightly  tender.   Abdomen:  Soft, non-tender, nondistended, normoactive bowel sounds,  no masses, no hepatosplenomegaly  Genitalia:  Normal external genitalia without lesions; mild atrophic changes. BUS and vagina normal;nocervical lesions or cervical motion tenderness. No abnormal vaginal discharge. Uterus and adnexa not enlarged, nontender, no masses.Papperformed  Rectal:  Normal tone, no masses or tenderness; guaiac negative stool  Extremities: No clubbing, cyanosis or edema  Pulses: 2+ and symmetric all extremities  Skin: Skin color, texture, turgor normal, no rashes or lesions. Skin folds at either side of mouth are now normal without any redness/rash. R thumb--there is subungual hematoma/bruising, dystrophic nail.  Surrounding skin is normal  Lymph nodes: Cervical, supraclavicular, inguinal and axillary nodes normal  Neurologic: Normal strength, sensation and gait; reflexes 2+ and symmetric throughout      Psych: Normal mood, affect, hygiene and grooming  PHQ-9 score of  6 (chronic trouble falling asleep, and trouble concentrating with reading only) GAD-7 score of  13   Recent labs reviewed:   Chemistry      Component Value Date/Time   NA 139 01/10/2021 1136   NA 140 01/19/2020 0811   K 4.8 01/10/2021 1136   CL 103 01/10/2021 1136   CO2 28 01/10/2021 1136   BUN 13 01/10/2021 1136   BUN 13 01/19/2020 0811   CREATININE 0.80 01/10/2021 1136   CREATININE 0.81 01/13/2017 0830      Component Value Date/Time   CALCIUM 9.5 01/10/2021 1136   CALCIUM 9.5 01/10/2021 1136   ALKPHOS 70 01/10/2021 1136   AST 21 01/10/2021 1136   ALT 29 01/10/2021 1136   BILITOT 0.5 01/10/2021 1136   BILITOT 0.4 10/04/2020 0850     Flu 89 Vitamin D-OH 61.86 Normal PTH, ionized calcium ESR 4  Lab Results  Component Value Date   TSH 1.31 01/10/2021    Lab Results  Component Value Date   WBC 4.5 01/10/2021   HGB 14.3 01/10/2021   HCT 41.6 01/10/2021   MCV 100.9 (H) 01/10/2021   PLT 222.0 01/10/2021    ASSESSMENT/PLAN:  Annual physical exam - Plan: Cytology - PAP([place])  Depression, major, single episode, moderate ([place]) - Pretty much in remission (sleep issues and concentration may not be related to depression); consider trial off Wellbutrin  Generalized anxiety disorder - Not well controlled on current regimen.  Encouraged further counseling. Can consider starting Buspar, discussed in detail  Multinodular goiter - stable, recently saw Dr. Chalmers Cater, no changes. [place] next year  Herpes labialis - not having frequently enough for preventative use, but we discussed this. Cont prn  Vitamin D deficiency - reviewed that she likely can go back to just MVI after completing rx (prev at goal on this dose)  Medication monitoring encounter  Seasonal allergies - controlled with current regimen. Discussed OTC drops to use if needed - Plan: montelukast (SINGULAIR) 10 MG tablet  Pure hypercholesterolemia - continue rosuvastatin, low cholesterol diet - Plan: Lipid panel, rosuvastatin (CRESTOR) 10 MG tablet   Due for Tdap, but recently got COVID booster.  To  schedule NV for Tdap for 2 weeks (or more) from date of COVID booster.  Pap today Discussed stopping aspirin  Anxiety--continue therapy.  Discussed possibility of Buspar, if felt necessary. Could add to current regimen, then possibly stop wellburin since depression not as much of an issue, vs stopping wellbutrin first.  We discussed these things, no decisions made.  Can continue working with therapist and let us know what they decide.  We discussed new reports/recommendations regarding  alcohol intake (that they are NOT seeing cardiovascular benefit from "modest" alcohol intake, that 2 glasses is above "modest", and potentially harmful). She is not agreeable to making any changes to this.  We did discuss perhaps mentioning this to her therapist, as this may be one of her coping skills for her anxiety.  Discussed monthly self breast exams and yearly mammograms; at least 30 minutes of aerobic activity at least 5 days/week, weight-bearing exercise at least 2x/wk; proper sunscreen use reviewed; healthy diet, including goals of calcium and vitamin D intake and alcohol recommendations (less than or equal to 1 drink/day) reviewed; regular seatbelt use; changing batteries in smoke detectors. Immunization recommendations discussed, Tdap due, can't be given today, within 2 weeks of getting COVID booster, to schedule NV.Continue yearly flu shots.[id number] at age 54. Colonoscopy UTD, due again 05/2028 (can consider Cologuard at that time instead)   F/u 1 year for CPE, sooner if needed related to moods or other issues.  I spent 70 minutes dedicated to the care of this patient, including pre-visit review of records, face to face time, post-visit ordering of testing and documentation. Significant amount of time spent counseling with regard to anxiety, alcohol, cholesterol, medications not related to wellness issues.

## 2021-03-04 NOTE — Patient Instructions (Addendum)
  HEALTH MAINTENANCE RECOMMENDATIONS:  It is recommended that you get at least 30 minutes of aerobic exercise at least 5 days/week (for weight loss, you may need as much as 60-90 minutes). This can be any activity that gets your heart rate up. This can be divided in 10-15 minute intervals if needed, but try and build up your endurance at least once a week.  Weight bearing exercise is also recommended twice weekly.  Eat a healthy diet with lots of vegetables, fruits and fiber.  "Colorful" foods have a lot of vitamins (ie green vegetables, tomatoes, red peppers, etc).  Limit sweet tea, regular sodas and alcoholic beverages, all of which has a lot of calories and sugar.  Up to 1 alcoholic drink daily may be beneficial for women (unless trying to lose weight, watch sugars).  Drink a lot of water.  Calcium recommendations are 1200-1500 mg daily (1500 mg for postmenopausal women or women without ovaries), and vitamin D 1000 IU daily.  This should be obtained from diet and/or supplements (vitamins), and calcium should not be taken all at once, but in divided doses.  Monthly self breast exams and yearly mammograms for women over the age of 72 is recommended.  Sunscreen of at least SPF 30 should be used on all sun-exposed parts of the skin when outside between the hours of 10 am and 4 pm (not just when at beach or pool, but even with exercise, golf, tennis, and yard work!)  Use a sunscreen that says "broad spectrum" so it covers both UVA and UVB rays, and make sure to reapply every 1-2 hours.  Remember to change the batteries in your smoke detectors when changing your clock times in the spring and fall. Carbon monoxide detectors are recommended for your home.  Use your seat belt every time you are in a car, and please drive safely and not be distracted with cell phones and texting while driving.  You are due for a tetanus booster (TdaP).  We usually like to separate vaccines by at least 2 weeks (or give them  on the same day).  Since you recently had your COVID booster, you will need to return at a later date (nurse visit) to get your TdaP.  You may stop the daily aspirin.  You can try eye drops for allergies (pataday, zaditor, etc)

## 2021-03-05 ENCOUNTER — Other Ambulatory Visit: Payer: Self-pay

## 2021-03-05 ENCOUNTER — Encounter: Payer: Self-pay | Admitting: Family Medicine

## 2021-03-05 ENCOUNTER — Ambulatory Visit (INDEPENDENT_AMBULATORY_CARE_PROVIDER_SITE_OTHER): Payer: 59 | Admitting: Family Medicine

## 2021-03-05 ENCOUNTER — Other Ambulatory Visit (HOSPITAL_COMMUNITY)
Admission: RE | Admit: 2021-03-05 | Discharge: 2021-03-05 | Disposition: A | Discharge status: Home / Self Care | Payer: 59 | Source: Ambulatory Visit | Attending: Family Medicine | Admitting: Family Medicine

## 2021-03-05 ENCOUNTER — Ambulatory Visit (INDEPENDENT_AMBULATORY_CARE_PROVIDER_SITE_OTHER): Payer: 59

## 2021-03-05 VITALS — BP 108/60 | HR 72 | Ht 65.5 in | Wt 166.6 lb

## 2021-03-05 VITALS — Ht 65.5 in | Wt 169.2 lb

## 2021-03-05 DIAGNOSIS — Z0000 Encounter for general adult medical examination without abnormal findings: Secondary | ICD-10-CM | POA: Diagnosis present

## 2021-03-05 DIAGNOSIS — M79644 Pain in right finger(s): Secondary | ICD-10-CM

## 2021-03-05 DIAGNOSIS — F411 Generalized anxiety disorder: Secondary | ICD-10-CM

## 2021-03-05 DIAGNOSIS — F321 Major depressive disorder, single episode, moderate: Secondary | ICD-10-CM

## 2021-03-05 DIAGNOSIS — B001 Herpesviral vesicular dermatitis: Secondary | ICD-10-CM

## 2021-03-05 DIAGNOSIS — Z5181 Encounter for therapeutic drug level monitoring: Secondary | ICD-10-CM

## 2021-03-05 DIAGNOSIS — S62524D Nondisplaced fracture of distal phalanx of right thumb, subsequent encounter for fracture with routine healing: Secondary | ICD-10-CM | POA: Diagnosis not present

## 2021-03-05 DIAGNOSIS — J302 Other seasonal allergic rhinitis: Secondary | ICD-10-CM

## 2021-03-05 DIAGNOSIS — E042 Nontoxic multinodular goiter: Secondary | ICD-10-CM | POA: Diagnosis not present

## 2021-03-05 DIAGNOSIS — E7800 Pure hypercholesterolemia, unspecified: Secondary | ICD-10-CM

## 2021-03-05 DIAGNOSIS — E559 Vitamin D deficiency, unspecified: Secondary | ICD-10-CM

## 2021-03-05 MED ORDER — MONTELUKAST SODIUM 10 MG PO TABS: 10.0000 mg | ORAL_TABLET | Freq: Every day | ORAL | 3 refills | Status: DC

## 2021-03-05 NOTE — Patient Instructions (Signed)
Thank you for coming in today.  Get xray.   Recheck as needed.   The fingernail will take a while to fall off and regrow.   I am here for you for this or other issues.

## 2021-03-06 LAB — LIPID PANEL
Chol/HDL Ratio: 2.3 ratio (ref 0.0–4.4)
Cholesterol, Total: 194 mg/dL (ref 100–199)
HDL: 85 mg/dL (ref >39)
LDL Chol Calc (NIH): 98 mg/dL (ref 0–99)
Triglycerides: 61 mg/dL (ref 0–149)
VLDL Cholesterol Cal: 11 mg/dL (ref 5–40)

## 2021-03-06 MED ORDER — ROSUVASTATIN CALCIUM 10 MG PO TABS: 10.0000 mg | ORAL_TABLET | Freq: Every day | ORAL | 3 refills | Status: DC

## 2021-03-06 NOTE — Progress Notes (Signed)
X-ray right thumb shows that the fracture has healed

## 2021-03-07 LAB — CYTOLOGY - PAP
Comment: NEGATIVE
Diagnosis: NEGATIVE
High risk HPV: NEGATIVE

## 2021-03-08 NOTE — Progress Notes (Signed)
[facility] [address] [place] Phone: [phone] Subjective:   I Rachel Juarez am serving as a Education administrator for Dr. Hulan Saas.  This visit occurred during the SARS-CoV-2 public health emergency.  Safety protocols were in place, including screening questions prior to the visit, additional usage of staff PPE, and extensive cleaning of exam room while observing appropriate contact time as indicated for disinfecting solutions.   I'm seeing this patient by the request  of:  Rita Ohara, MD  CC: Right foot pain  FGH:[id number]   2/16/2022Patient has an MRI showing the talar dome fracture.  Ultrasound today shows the patient still has a bony callus in this area but does not seem to be completely healed at this time.  We will see with patient potentially being able to get a bone stimulator now with this being greater than 3 months since the injury.  I think that this could help with it.  Patient was started all other conservative therapy including formal physical therapy.  Able to do daily activities with no pain and not taking anything for pain.  Still has some limited dorsiflexion.  Follow-up again in 2 months  Update 03/13/2021 Rachel Juarez is a 65 y.o. female coming in with complaint of right foot pain. Has been seeing Dr. Georgina Snell for R thumb fracture that has healed as noted on 03/05/2021. Patient states she is not 100% and wants to know if the bone has healed.  Patient states that she is able to workout on a fairly regular basis now.     Past Medical History:  Diagnosis Date  . Allergy   . Anxiety   . Depression, major, in remission ([place]) 2006   when daughter dx'd with autoimmune disorder  . Family history of breast cancer   . Family history of cancer tonsil   . Family history of thyroid cancer   . Generalized anxiety disorder    generalized, and related to mammograms and flying  . Glaucoma suspect    sees Dr. Katy Fitch, has all been  okay, sees him qoyr  . Herpes labialis   . Hypercholesterolemia    high HDL, borderline LDL  . Hypothyroidism    treated by Dr. Chalmers Cater  . Multinodular goiter    followed by Dr. Chalmers Cater; u/s q2 yr  . 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 Seasonal allergies    takes singulair year-round; worse in the Fall   Past Surgical History:  Procedure Laterality Date  . CATARACT EXTRACTION, BILATERAL Bilateral June/July 2021   Dr. Gillian Scarce   Social History   Socioeconomic History  . Marital status: Married    Spouse name: Not on file  . Number of children: Not on file  . Years of education: Not on file  . Highest education level: Not on file  Occupational History  . Occupation: Risk manager  Tobacco Use  . Smoking status: Never Smoker  . Smokeless tobacco: Never Used  Vaping Use  . Vaping Use: Never used  Substance and Sexual Activity  . Alcohol use: Yes    Alcohol/week: 0.0 standard drinks    Comment: 2 Vodka sodas/day  . Drug use: No  . Sexual activity: Yes    Partners: Male    Birth control/protection: Post-menopausal  Other Topics Concern  . Not on file  Social History Narrative   Pre-COVID, taught yoga at multiple places.    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  Engaged to be married. (comes home for her IVIG treatments still)   1 dog (Malti-poo)   Volunteers at the [employer] Strain: Not on file  Food Insecurity: Not on file  Transportation Needs: Not on file  Physical Activity: Not on file  Stress: Not on file  Social Connections: Not on file   Allergies  Allergen Reactions  . Demerol [Meperidine] Other (See Comments)    Low bp, passes out   Family History  Problem Relation Age of Onset  . Lupus Mother   . Cancer Father 76       thymic cancer  .  COPD Father   . Heart disease Father        CHF  . Asthma Father   . Cancer Sister   . Breast cancer Sister 31       "best kind", lumpectomy and radiation  . Dermatomyositis Daughter 76       juvenile form  . Irritable bowel syndrome Daughter   . Colon cancer Maternal Grandmother        80's  . Cancer Maternal Grandmother   . Hypertension Paternal Grandmother   . Hyperlipidemia Paternal Grandmother   . Thyroid disease Paternal Grandmother        had goiter removed  . Stroke Paternal Grandmother 81  . Hypertension Paternal Grandfather   . Hyperlipidemia Paternal Grandfather   . Heart disease Paternal Grandfather        MI first in his 53's  . Stroke Paternal Grandfather   . Cancer Paternal Uncle        started on tonsils (smoker)  . Breast cancer Paternal Aunt 34  . Breast cancer Cousin 47  . Breast cancer Cousin 44       (the mother of the 51 y/o cousin with br ca  . Thyroid cancer Cousin   . Diabetes Neg Hx   . Ovarian cancer Neg Hx     Current Outpatient Medications (Endocrine & Metabolic):  .  levothyroxine (SYNTHROID, LEVOTHROID) 75 MCG tablet, Take 75 mcg by mouth daily before breakfast.  Current Outpatient Medications (Cardiovascular):  .  rosuvastatin (CRESTOR) 10 MG tablet, Take 1 tablet (10 mg total) by mouth daily.  Current Outpatient Medications (Respiratory):  .  fexofenadine (ALLEGRA) 180 MG tablet, Take 180 mg by mouth daily. .  montelukast (SINGULAIR) 10 MG tablet, Take 1 tablet (10 mg total) by mouth at bedtime. .  Triamcinolone Acetonide (NASACORT AQ NA), Place 2 sprays into the nose daily.    Current Outpatient Medications (Other):  Marland Kitchen  ALPRAZolam (XANAX) 0.5 MG tablet, Take 0.5-1 tablets (0.25-0.5 mg total) by mouth 3 (three) times daily as needed for anxiety or sleep. Marland Kitchen  buPROPion (WELLBUTRIN XL) 150 MG 24 hr tablet, Take 1 tablet (150 mg total) by mouth daily. .  Calcium 250 MG CAPS, Take 2 capsules by mouth daily. .  citalopram (CELEXA) 40 MG  tablet, TAKE 1 TABLET BY MOUTH EVERY DAY .  hydrocortisone 2.5 % ointment, Apply topically 2 (two) times daily. .  Magnesium 500 MG TABS, Take 1 tablet by mouth daily. Marland Kitchen  MILK THISTLE PO, Take 1 capsule by mouth daily. .  Misc Natural Products (TART CHERRY ADVANCED PO), Take 1 capsule by mouth daily. .  Multiple Vitamins-Minerals (  MULTIVITAMIN WITH MINERALS) tablet, Take 1 tablet by mouth daily. .  mupirocin ointment (BACTROBAN) 2 %, Apply 1 application topically 2 (two) times daily. .  NON FORMULARY, Take 2 capsules by mouth daily. .  NON FORMULARY, Take 2 capsules by mouth daily. .  Omega-3 Fatty Acids (FISH OIL) 1000 MG CPDR, Take 2,000 mg by mouth daily. .  valACYclovir (VALTREX) 1000 MG tablet, TAKE 2 TABLETS AT ONSET OF COLD SORE. REPEAT ONCE IN 12 HOURS (4 PILLS/COURSE) .  vitamin C (ASCORBIC ACID) 500 MG tablet, Take 500 mg by mouth daily. .  Vitamin D, Ergocalciferol, (DRISDOL) 1.25 MG (50000 UNIT) CAPS capsule, TAKE 1 CAPSULE (50,000 UNITS TOTAL) BY MOUTH EVERY 7 (SEVEN) DAYS   Reviewed prior external information including notes and imaging from  primary care provider As well as notes that were available from care everywhere and other healthcare systems.  Past medical history, social, surgical and family history all reviewed in electronic medical record.  No pertanent information unless stated regarding to the chief complaint.   Review of Systems:  No headache, visual changes, nausea, vomiting, diarrhea, constipation, dizziness, abdominal pain, skin rash, fevers, chills, night sweats, weight loss, swollen lymph nodes, body aches, joint swelling, chest pain, shortness of breath, mood changes. POSITIVE muscle aches  Objective  Blood pressure 120/82, pulse 67, height 5' 5.5" (1.664 m), weight 170 lb (77.1 kg), last menstrual period 11/18/2008, SpO2 97 %.   General: No apparent distress alert and oriented x3 mood and affect normal, dressed appropriately.  HEENT: Pupils equal,  extraocular movements intact  Respiratory: Patient's speak in full sentences and does not appear short of breath  Cardiovascular: No lower extremity edema, non tender, no erythema  Gait normal with good balance and coordination.  MSK: Right ankle does show the patient lacks the last 10 degrees of extension compared to her contralateral side.  Patient ATFL though it does appear to be intact mostly.  Minorly tender over the talar dome.  Limited musculoskeletal ultrasound was performed and interpreted by Lyndal Pulley  Limited ultrasound still does not show the ATFL.  Patient does have the avulsion fracture still noted of the distal talus seems to be making some improvement.  Patient does have good callus formation noted. Impression: Interval changes of the talar dome    Impression and Recommendations:     The above documentation has been reviewed and is accurate and complete Lyndal Pulley, DO

## 2021-03-13 ENCOUNTER — Ambulatory Visit (INDEPENDENT_AMBULATORY_CARE_PROVIDER_SITE_OTHER): Payer: 59 | Admitting: Family Medicine

## 2021-03-13 ENCOUNTER — Encounter: Payer: Self-pay | Admitting: Family Medicine

## 2021-03-13 ENCOUNTER — Ambulatory Visit: Payer: Self-pay

## 2021-03-13 ENCOUNTER — Other Ambulatory Visit: Payer: Self-pay

## 2021-03-13 ENCOUNTER — Telehealth: Payer: Self-pay | Admitting: Licensed Clinical Social Worker

## 2021-03-13 VITALS — BP 120/82 | HR 67 | Ht 65.5 in | Wt 170.0 lb

## 2021-03-13 DIAGNOSIS — S92144D Nondisplaced dome fracture of right talus, subsequent encounter for fracture with routine healing: Secondary | ICD-10-CM

## 2021-03-13 DIAGNOSIS — M79671 Pain in right foot: Secondary | ICD-10-CM

## 2021-03-13 DIAGNOSIS — F411 Generalized anxiety disorder: Secondary | ICD-10-CM

## 2021-03-13 MED ORDER — BUSPIRONE HCL 15 MG PO TABS: 7.5000 mg | ORAL_TABLET | Freq: Two times a day (BID) | ORAL | 0 refills | Status: DC

## 2021-03-13 NOTE — Assessment & Plan Note (Signed)
Patient does have more of a talar dome fracture that seems to be nearly completely healed with some posttraumatic arthritis.  Patient at this point continues to describe some instability but is able to work out with a trainer without any significant difficulty and not taking medications for it.  Patient is doing the bone stimulator and encouraged her to do it for another month.  Discussed icing regimen and home exercises.  Discussed avoiding certain activities.  Follow-up with me again in 2 to 3 months

## 2021-03-13 NOTE — Patient Instructions (Addendum)
Good to see you Looking great overall small chip you should reabsorb and some arthritis Continue being active Worsening pain give me a call Use bone stimulator for another month See me again in 2-3 months or see me when you need me

## 2021-03-13 NOTE — Telephone Encounter (Signed)
Rachel Juarez, who I saw in 2020 and had negative genetic testing, called to request a referral to the high risk clinic. I put this in for her today.

## 2021-03-14 ENCOUNTER — Telehealth: Payer: Self-pay | Admitting: Hematology and Oncology

## 2021-03-14 NOTE — Telephone Encounter (Signed)
Received a new pt referral for Rachel Juarez to be seen in the high risk breast clinic. Rachel Juarez returned my call and has been scheduled to see Dr. Chryl Heck on 4/28 at 11:20am. Pt aware to arrive 20 minutes early.

## 2021-03-16 ENCOUNTER — Other Ambulatory Visit: Payer: Self-pay

## 2021-03-16 ENCOUNTER — Other Ambulatory Visit (INDEPENDENT_AMBULATORY_CARE_PROVIDER_SITE_OTHER): Payer: 59

## 2021-03-16 DIAGNOSIS — Z23 Encounter for immunization: Secondary | ICD-10-CM

## 2021-03-22 ENCOUNTER — Inpatient Hospital Stay: Payer: 59 | Attending: Hematology and Oncology | Admitting: Hematology and Oncology

## 2021-03-22 ENCOUNTER — Telehealth: Payer: Self-pay | Admitting: Family Medicine

## 2021-03-22 ENCOUNTER — Telehealth: Payer: Self-pay | Admitting: Hematology and Oncology

## 2021-03-22 ENCOUNTER — Other Ambulatory Visit: Payer: Self-pay

## 2021-03-22 ENCOUNTER — Encounter: Payer: Self-pay | Admitting: Hematology and Oncology

## 2021-03-22 VITALS — BP 116/83 | HR 82 | Temp 97.6°F | Resp 16 | Ht 60.5 in | Wt 170.1 lb

## 2021-03-22 DIAGNOSIS — Z780 Asymptomatic menopausal state: Secondary | ICD-10-CM | POA: Insufficient documentation

## 2021-03-22 DIAGNOSIS — F419 Anxiety disorder, unspecified: Secondary | ICD-10-CM

## 2021-03-22 DIAGNOSIS — Z79810 Long term (current) use of selective estrogen receptor modulators (SERMs): Secondary | ICD-10-CM | POA: Diagnosis present

## 2021-03-22 DIAGNOSIS — Z808 Family history of malignant neoplasm of other organs or systems: Secondary | ICD-10-CM | POA: Diagnosis not present

## 2021-03-22 DIAGNOSIS — Z803 Family history of malignant neoplasm of breast: Secondary | ICD-10-CM

## 2021-03-22 DIAGNOSIS — Z9189 Other specified personal risk factors, not elsewhere classified: Secondary | ICD-10-CM

## 2021-03-22 MED ORDER — TAMOXIFEN CITRATE 20 MG PO TABS: 20.0000 mg | ORAL_TABLET | Freq: Every day | ORAL | 0 refills | Status: DC

## 2021-03-22 NOTE — Progress Notes (Signed)
[facility] CONSULT NOTE  Patient Care Team: Rita Ohara, MD as PCP - General (Family Medicine)  CHIEF COMPLAINTS/PURPOSE OF CONSULTATION:  Increased life time risk of breast cancer.  ASSESSMENT & PLAN:   This is a very pleasant 65 year old female patient with no significant past medical history except for anxiety referred to high risk breast cancer clinic given her increased lifetime risk of breast cancer.  Patient met with our genetic counselor about 2 years ago, had genetic testing which was negative for any pathogenic mutations.  She most recently received a letter which stated that she might qualify for MRI breast cancer screening hence made this appointment. Last mammogram done in March 2022 BI-RADS 1.  We have discussed that based on Tyer Cusick models production, her lifetime risk is around 24% and she would qualify for MRI as well as mammogram screening.  We would recommend staggering the MRIs and mammograms.  She understands that MRIs are highly sensitive but not specific and may lead to additional biopsies and the long-term risk of gadolinium is unknown at this time with repeated MRIs.  She is willing to proceed with MRI screening and this has been ordered for September 2022.  We have also calculated her 5-year risk of breast cancer which is about 4.7% based on Gail's model.  Given this information we have discussed about tamoxifen prevention.  We have discussed mechanism of action of tamoxifen, adverse effects of tamoxifen including but not limited to postmenopausal symptoms such as hot flashes, mood changes, increased risk of DVT/PE, questionable risk of cardiovascular events, increased risk of endometrial cancers and the benefit of tamoxifen would be improvement in bone density.  She is willing to try tamoxifen.  She had family members who were on tamoxifen and they tolerated it well.  We have discussed that tamoxifen did not necessarily prolong survival but decreased risk  of ER positive breast cancers in ipsilateral and contralateral breast.  Typical duration of tamoxifen use is 5 years and the dose is 20 mg daily.  Tamoxifen has been prescribed.  However there is a major interaction with Wellbutrin hence have recommended her to not start tamoxifen unless we hear back from her PCP about changing Wellbutrin to another medication.  There is also minor interaction with Celexa but no dose modification needed, both these drugs are known to cause QT prolongation and the level of interaction is B  She was encouraged to return to clinic in 8 weeks for tamoxifen toxicity check. She is appreciative of all the recommendations.  All her questions were answered to the best of my knowledge.  HISTORY OF PRESENTING ILLNESS:   Rachel Juarez 65 y.o. female is here because of increased life time risk of breast cancer to High risk breast cancer clinic.  This is a very pleasant 65 year old female patient with a past medical history significant for major depression, hypothyroidism referred to high risk breast cancer clinic because of her increased lifetime risk of breast cancer. Ms. [name] was seen back in 2020 by our genetics team given her family history of cancer and had genetic testing which did not reveal any evidence of pathogenic mutations.  She however recently received a letter which stated that her lifetime risk of breast cancer is greater than 20% and there is suggestion to consider MRI screening and hence requested a referral to high risk breast cancer clinic.  She is understandably anxious about the possibility of having breast cancer. She was hoping to do any modifications to  her lifestyle and consider chemoprevention if she qualifies for it.  She remembers having 2 breast biopsies in the past which were benign. She is otherwise healthy, active and works as a Psychologist, occupational in [employer]. Rest of the pertinent 10 point ROS reviewed and negative.  Age of menarche:  57 First live birth at age 74 Oral contraceptive pill use for about 30 years.  No use of HRT.  She is postmenopausal.  She had her last mammogram in March 2022 family history significant for breast cancer in maternal aunt was diagnosed in her mid 48s, maternal cousin diagnosed at age of 58 and a cousin's daughter who was diagnosed with breast cancer at the age of 16.  Maternal grandmother had colon cancer in her 21s.  Father had thymic cancer.  Another paternal aunt had breast cancer at 37 and is currently living. Rest of the pertinent 10 point ROS reviewed and negative except for some anxiety which is situational.  REVIEW OF SYSTEMS:   Constitutional: Denies fevers, chills or abnormal night sweats Eyes: Denies blurriness of vision, double vision or watery eyes Ears, nose, mouth, throat, and face: Denies mucositis or sore throat Respiratory: Denies cough, dyspnea or wheezes Cardiovascular: Denies palpitation, chest discomfort or lower extremity swelling Gastrointestinal:  Denies nausea, heartburn or change in bowel habits Skin: Denies abnormal skin rashes Lymphatics: Denies new lymphadenopathy or easy bruising Neurological:Denies numbness, tingling or new weaknesses Behavioral/Psych: Mood is stable, no new changes  All other systems were reviewed with the patient and are negative.  MEDICAL HISTORY:  Past Medical History:  Diagnosis Date  . Allergy   . Anxiety   . Depression, major, in remission ([place]) 2006   when daughter dx'd with autoimmune disorder  . Family history of breast cancer   . Family history of cancer tonsil   . Family history of thyroid cancer   . Generalized anxiety disorder    generalized, and related to mammograms and flying  . Glaucoma suspect    sees Dr. Katy Fitch, has all been okay, sees him qoyr  . Herpes labialis   . Hypercholesterolemia    high HDL, borderline LDL  . Hypothyroidism    treated by Dr. Chalmers Cater  . Multinodular goiter    followed by Dr. Chalmers Cater; u/s q2  yr  . 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 Seasonal allergies    takes singulair year-round; worse in the Fall    SURGICAL HISTORY: Past Surgical History:  Procedure Laterality Date  . CATARACT EXTRACTION, BILATERAL Bilateral June/July 2021   Dr. Gillian Scarce    SOCIAL HISTORY: Social History   Socioeconomic History  . Marital status: Married    Spouse name: Not on file  . Number of children: Not on file  . Years of education: Not on file  . Highest education level: Not on file  Occupational History  . Occupation: Risk manager  Tobacco Use  . Smoking status: Never Smoker  . Smokeless tobacco: Never Used  Vaping Use  . Vaping Use: Never used  Substance and Sexual Activity  . Alcohol use: Yes    Alcohol/week: 0.0 standard drinks    Comment: 2 Vodka sodas/day  . Drug use: No  . Sexual activity: Yes    Partners: Male    Birth control/protection: Post-menopausal  Other Topics Concern  . Not on file  Social History Narrative   Pre-COVID, taught yoga at multiple places.    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  Engaged to be married. (comes home for her IVIG treatments still)   1 dog (Malti-poo)   Volunteers at the [employer] Strain: Not on file  Food Insecurity: Not on file  Transportation Needs: Not on file  Physical Activity: Not on file  Stress: Not on file  Social Connections: Not on file  Intimate Partner Violence: Not on file    FAMILY HISTORY: Family History  Problem Relation Age of Onset  . Lupus Mother   . Cancer Father 12       thymic cancer  . COPD Father   . Heart disease Father        CHF  . Asthma Father   . Cancer Sister   . Breast cancer Sister 27       "best kind", lumpectomy and radiation  . Dermatomyositis Daughter 39        juvenile form  . Irritable bowel syndrome Daughter   . Colon cancer Maternal Grandmother        80's  . Cancer Maternal Grandmother   . Hypertension Paternal Grandmother   . Hyperlipidemia Paternal Grandmother   . Thyroid disease Paternal Grandmother        had goiter removed  . Stroke Paternal Grandmother 81  . Hypertension Paternal Grandfather   . Hyperlipidemia Paternal Grandfather   . Heart disease Paternal Grandfather        MI first in his 5's  . Stroke Paternal Grandfather   . Cancer Paternal Uncle        started on tonsils (smoker)  . Breast cancer Paternal Aunt 58  . Breast cancer Cousin 88  . Breast cancer Cousin 62       (the mother of the 62 y/o cousin with br ca  . Thyroid cancer Cousin   . Diabetes Neg Hx   . Ovarian cancer Neg Hx     ALLERGIES:  is allergic to demerol [meperidine].  MEDICATIONS:  Current Outpatient Medications  Medication Sig Dispense Refill  . ALPRAZolam (XANAX) 0.5 MG tablet Take 0.5-1 tablets (0.25-0.5 mg total) by mouth 3 (three) times daily as needed for anxiety or sleep. 15 tablet 0  . buPROPion (WELLBUTRIN XL) 150 MG 24 hr tablet Take 1 tablet (150 mg total) by mouth daily. 90 tablet 0  . busPIRone (BUSPAR) 15 MG tablet Take 0.5 tablets (7.5 mg total) by mouth 2 (two) times daily. 60 tablet 0  . Calcium 250 MG CAPS Take 2 capsules by mouth daily.    . citalopram (CELEXA) 40 MG tablet TAKE 1 TABLET BY MOUTH EVERY DAY 90 tablet 0  . fexofenadine (ALLEGRA) 180 MG tablet Take 180 mg by mouth daily.    . hydrocortisone 2.5 % ointment Apply topically 2 (two) times daily. 30 g 0  . levothyroxine (SYNTHROID, LEVOTHROID) 75 MCG tablet Take 75 mcg by mouth daily before breakfast.    . Magnesium 500 MG TABS Take 1 tablet by mouth daily.    Marland Kitchen MILK THISTLE PO Take 1 capsule by mouth daily.    . Misc Natural Products (TART CHERRY ADVANCED PO) Take 1 capsule by mouth daily.    . montelukast (SINGULAIR) 10 MG tablet Take 1 tablet (10 mg total) by  mouth at bedtime. 90 tablet 3  . Multiple Vitamins-Minerals (MULTIVITAMIN WITH  MINERALS) tablet Take 1 tablet by mouth daily.    . mupirocin ointment (BACTROBAN) 2 % Apply 1 application topically 2 (two) times daily. 22 g 0  . NON FORMULARY Take 2 capsules by mouth daily.    . NON FORMULARY Take 2 capsules by mouth daily.    . Omega-3 Fatty Acids (FISH OIL) 1000 MG CPDR Take 2,000 mg by mouth daily.    . rosuvastatin (CRESTOR) 10 MG tablet Take 1 tablet (10 mg total) by mouth daily. 90 tablet 3  . Triamcinolone Acetonide (NASACORT AQ NA) Place 2 sprays into the nose daily.    . valACYclovir (VALTREX) 1000 MG tablet TAKE 2 TABLETS AT ONSET OF COLD SORE. REPEAT ONCE IN 12 HOURS (4 PILLS/COURSE) 28 tablet 0  . vitamin C (ASCORBIC ACID) 500 MG tablet Take 500 mg by mouth daily.    . Vitamin D, Ergocalciferol, (DRISDOL) 1.25 MG (50000 UNIT) CAPS capsule TAKE 1 CAPSULE (50,000 UNITS TOTAL) BY MOUTH EVERY 7 (SEVEN) DAYS (Patient not taking: Reported on 03/22/2021) 12 capsule 0   No current facility-administered medications for this visit.     PHYSICAL EXAMINATION: ECOG PERFORMANCE STATUS: 0 - Asymptomatic  Vitals:   03/22/21 1112  BP: 116/83  Pulse: 82  Resp: 16  Temp: 97.6 F (36.4 C)  SpO2: 99%   Filed Weights   03/22/21 1112  Weight: 170 lb 1.6 oz (77.2 kg)    GENERAL:alert, no distress and comfortable SKIN: skin color, texture, turgor are normal, no rashes or significant lesions EYES: normal, conjunctiva are pink and non-injected, sclera clear OROPHARYNX:no exudate, no erythema and lips, buccal mucosa, and tongue normal  NECK: supple, thyroid normal size, non-tender, without nodularity LYMPH:  no palpable lymphadenopathy in the cervical, axillary or inguinal LUNGS: clear to auscultation and percussion with normal breathing effort HEART: regular rate & rhythm and no murmurs and no lower extremity edema ABDOMEN:abdomen soft, non-tender and normal bowel sounds Musculoskeletal:no  cyanosis of digits and no clubbing  PSYCH: alert & oriented x 3 with fluent speech NEURO: no focal motor/sensory deficits  LABORATORY DATA:  I have reviewed the data as listed Lab Results  Component Value Date   WBC 4.5 01/10/2021   HGB 14.3 01/10/2021   HCT 41.6 01/10/2021   MCV 100.9 (H) 01/10/2021   PLT 222.0 01/10/2021     Chemistry      Component Value Date/Time   NA 139 01/10/2021 1136   NA 140 01/19/2020 0811   K 4.8 01/10/2021 1136   CL 103 01/10/2021 1136   CO2 28 01/10/2021 1136   BUN 13 01/10/2021 1136   BUN 13 01/19/2020 0811   CREATININE 0.80 01/10/2021 1136   CREATININE 0.81 01/13/2017 0830      Component Value Date/Time   CALCIUM 9.5 01/10/2021 1136   CALCIUM 9.5 01/10/2021 1136   ALKPHOS 70 01/10/2021 1136   AST 21 01/10/2021 1136   ALT 29 01/10/2021 1136   BILITOT 0.5 01/10/2021 1136   BILITOT 0.4 10/04/2020 0850       RADIOGRAPHIC STUDIES: I have personally reviewed the radiological images as listed and agreed with the findings in the report. DG Finger Thumb Right  Result Date: 03/06/2021 CLINICAL DATA:  Follow-up fracture EXAM: RIGHT THUMB 2+V COMPARISON:  02/01/2021, 01/18/2019 FINDINGS: Interval healing of nondisplaced fracture of the tuft of the distal phalanx. No appreciable residual fracture lucency. Mild thumb interphalangeal joint arthrosis. Soft tissues are unremarkable. IMPRESSION: Interval healing of nondisplaced fracture of the tuft of the distal phalanx  of the right thumb. No appreciable residual fracture lucency. Electronically Signed   By: Eddie Candle M.D.   On: 03/06/2021 14:45    All questions were answered. The patient knows to call the clinic with any problems, questions or concerns. I spent 60 minutes in the care of this patient including H and P, review of records, counseling and coordination of care.     Benay Pike, MD 03/22/2021 11:26 AM

## 2021-03-22 NOTE — Telephone Encounter (Signed)
Called pt to discuss stopping the Wellbutrin, given the interaction with Tamoxifen that is going to be started to reduce risk for breast cancer.  It was started in 08/2019 (added to citalopram for depression). Depression is much better now (still dealing with anxiety).   We discussed how to stop, and to look for any symptoms of worsening depression after being off. To seek out psychiatrist for med management if worsening depression.  Most recently, Buspar was added for anxiety. She has been on Buspar for 1-2 weeks, now up to 1/2 tab BID. Tolerating it well. She was anxious about seeing the oncologist, but finally went.  All questions answered.

## 2021-03-22 NOTE — Telephone Encounter (Signed)
Scheduled per los. Confirmed appt. Declined printout  

## 2021-03-23 ENCOUNTER — Telehealth: Payer: Self-pay

## 2021-03-23 NOTE — Telephone Encounter (Signed)
LVM for patient to call back regarding medications per Dr Rob Hickman instruction. Will follow-up.

## 2021-03-23 NOTE — Telephone Encounter (Signed)
Spoke with patient regarding wellbutrin/tamoxifen interactions per Dr Chryl Heck. Patient stated that she and Dr Tomi Bamberger had made a plan for weaning wellbutrin and then starting tamoxifen.

## 2021-04-05 ENCOUNTER — Other Ambulatory Visit: Payer: Self-pay | Admitting: Family Medicine

## 2021-04-05 DIAGNOSIS — F411 Generalized anxiety disorder: Secondary | ICD-10-CM

## 2021-04-05 NOTE — Telephone Encounter (Signed)
Patient did request this refill, I called.

## 2021-04-05 NOTE — Telephone Encounter (Signed)
RF sent for 90d.  She was supposed to schedule a follow-up after starting buspar.  She didn't, but we spoke on the phone after I got a message from specialist (and I know she is tolerating this). We did make some changes to her meds.  I'd like to see her in 6 weeks (can be virtual, if preferred).  Please schedule med check for her in 6 weeks. Thanks

## 2021-04-06 ENCOUNTER — Telehealth: Payer: 59 | Admitting: Medical

## 2021-04-06 ENCOUNTER — Encounter: Payer: Self-pay | Admitting: Medical

## 2021-04-06 ENCOUNTER — Encounter: Payer: Self-pay | Admitting: Family Medicine

## 2021-04-06 VITALS — Ht 66.0 in | Wt 170.0 lb

## 2021-04-06 DIAGNOSIS — U071 COVID-19: Secondary | ICD-10-CM | POA: Diagnosis not present

## 2021-04-06 DIAGNOSIS — R059 Cough, unspecified: Secondary | ICD-10-CM

## 2021-04-06 MED ORDER — EMERGEN-C IMMUNE PLUS PO PACK: 1.0000 | PACK | Freq: Two times a day (BID) | ORAL | 0 refills | Status: DC

## 2021-04-06 MED ORDER — BENZONATATE 200 MG PO CAPS: 200.0000 mg | ORAL_CAPSULE | Freq: Three times a day (TID) | ORAL | 0 refills | Status: DC | PRN

## 2021-04-06 MED ORDER — PAXLOVID 20 X 150 MG & 10 X 100MG PO TBPK: 3.0000 | ORAL_TABLET | Freq: Two times a day (BID) | ORAL | 0 refills | Status: AC

## 2021-04-06 NOTE — Patient Instructions (Signed)
General recommendations: I recommend you rest, hydrate well with water and clear fluids throughout the day.   You can use Tylenol for pain or fever You can use over the counter Tessalon Perles for cough as discussed. You can use over the counter Emetrol for nausea.     She is very concerned about getting worse.  She is over 62 in would like to use packs livid.  Her risk factors include overweight and over age 65.  She specifically would like to try Paxlovid.  We discussed the medication proper use, risk and benefits of medication.  If you are having trouble breathing, if you are very weak, have high fever 103 or higher consistently despite Tylenol, or uncontrollable nausea and vomiting, then call or go to the emergency department.    Covid symptoms such as fatigue and cough can linger over 2 weeks, even after the initial fever, aches, chills, and other initial symptoms.   Self Quarantine: The CDC, Centers for Disease Control has recommended a self quarantine of 5 days from the start of your illness until you are symptom-free including at least 24 hours of no symptoms including no fever, no shortness of breath, and no body aches and chills, by day 5 before returning to work or general contact with the public.  What does self quarantine mean: avoiding contact with people as much as possible.   Particularly in your house, isolate your self from others in a separate room, wear a mask when possible in the room, particularly if coughing a lot.   Have others bring food, water, medications, etc., to your door, but avoid direct contact with your household contacts during this time to avoid spreading the infection to them.   If you have a separate bathroom and living quarters during the next 2 weeks away from others, that would be preferable.    If you can't completely isolate, then wear a mask, wash hands frequently with soap and water for at least 15 seconds, minimize close contact with others, and have a  friend or family member check regularly from a distance to make sure you are not getting seriously worse.     You should not be going out in public, should not be going to stores, to work or other public places until all your symptoms have resolved and at least 5 days + 24 hours of no symptoms at all have transpired.   Ideally you should avoid contact with others for a full 5 days if possible.  One of the goals is to limit spread to high risk people; people that are older and elderly, people with multiple health issues like diabetes, heart disease, lung disease, and anybody that has weakened immune systems such as people with cancer or on immunosuppressive therapy.

## 2021-04-06 NOTE — Progress Notes (Signed)
Subjective:     Patient ID: Rachel Juarez, female   DOB: 1956/04/09, 65 y.o.   MRN: [id number]  This visit type was conducted due to national recommendations for restrictions regarding the COVID-19 Pandemic (e.g. social distancing) in an effort to limit this patient's exposure and mitigate transmission in our community.  Due to their co-morbid illnesses, this patient is at least at moderate risk for complications without adequate follow up.  This format is felt to be most appropriate for this patient at this time.    Documentation for virtual audio and video telecommunications through [place] encounter:  The patient was located at home. The provider was located in the office. The patient did consent to this visit and is aware of possible charges through their insurance for this visit.  The other persons participating in this telemedicine service were none. Time spent on call was 20 minutes and in review of previous records 20 minutes total.  This virtual service is not related to other E/M service within previous 7 days.   HPI Chief Complaint  Patient presents with  . Covid Positive    Symptoms sore throat, drainage, headache, cough started last night. Tested positive this morning    Virtual consult for covid illness. Tested positive for covid this morning with home test . She notes variety of symptoms, cough, headache, scratchy throat, lots of drainage, overall feels yucky.   Symptoms began overnight.  No loss of smell or taste.  No nausea, no vomiting, no diarrhea.  No SOB or wheezy.  Has mild fever.  Coughing quite a bit.  At times has cough fits.   Using nothing for symptoms yet.  No hx/o asthma or lung disease.  Nonsmoker.  No other aggravating or relieving factors. No other complaint.   Past Medical History:  Diagnosis Date  . Allergy   . Anxiety   . Depression, major, in remission ([place]) 2006   when daughter dx'd with autoimmune disorder  . Family history of breast  cancer   . Family history of cancer tonsil   . Family history of thyroid cancer   . Generalized anxiety disorder    generalized, and related to mammograms and flying  . Glaucoma suspect    sees Dr. Katy Fitch, has all been okay, sees him qoyr  . Herpes labialis   . Hypercholesterolemia    high HDL, borderline LDL  . Hypothyroidism    treated by Dr. Chalmers Cater  . Multinodular goiter    followed by Dr. Chalmers Cater; u/s q2 yr  . 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 Seasonal allergies    takes singulair year-round; worse in the Fall   Current Outpatient Medications on File Prior to Visit  Medication Sig Dispense Refill  . ALPRAZolam (XANAX) 0.5 MG tablet Take 0.5-1 tablets (0.25-0.5 mg total) by mouth 3 (three) times daily as needed for anxiety or sleep. 15 tablet 0  . busPIRone (BUSPAR) 15 MG tablet TAKE 0.5 TABLETS (7.5 MG TOTAL) BY MOUTH 2 (TWO) TIMES DAILY. 90 tablet 0  . Calcium 250 MG CAPS Take 2 capsules by mouth daily.    . citalopram (CELEXA) 40 MG tablet TAKE 1 TABLET BY MOUTH EVERY DAY 90 tablet 0  . fexofenadine (ALLEGRA) 180 MG tablet Take 180 mg by mouth daily.    . hydrocortisone 2.5 % ointment Apply topically 2 (two) times daily. 30 g 0  . levothyroxine (SYNTHROID, LEVOTHROID) 75 MCG tablet Take 75 mcg by mouth daily before  breakfast.    . Magnesium 500 MG TABS Take 1 tablet by mouth daily.    Marland Kitchen MILK THISTLE PO Take 1 capsule by mouth daily.    . Misc Natural Products (TART CHERRY ADVANCED PO) Take 1 capsule by mouth daily.    . montelukast (SINGULAIR) 10 MG tablet Take 1 tablet (10 mg total) by mouth at bedtime. 90 tablet 3  . Multiple Vitamins-Minerals (MULTIVITAMIN WITH MINERALS) tablet Take 1 tablet by mouth daily.    . mupirocin ointment (BACTROBAN) 2 % Apply 1 application topically 2 (two) times daily. 22 g 0  . NON FORMULARY Take 2 capsules by mouth daily.    . NON FORMULARY Take 2 capsules by mouth daily.    . Omega-3 Fatty Acids (FISH OIL) 1000 MG CPDR Take 2,000 mg  by mouth daily.    . rosuvastatin (CRESTOR) 10 MG tablet Take 1 tablet (10 mg total) by mouth daily. 90 tablet 3  . Triamcinolone Acetonide (NASACORT AQ NA) Place 2 sprays into the nose daily.    . valACYclovir (VALTREX) 1000 MG tablet TAKE 2 TABLETS AT ONSET OF COLD SORE. REPEAT ONCE IN 12 HOURS (4 PILLS/COURSE) 28 tablet 0  . vitamin C (ASCORBIC ACID) 500 MG tablet Take 500 mg by mouth daily.    Marland Kitchen buPROPion (WELLBUTRIN XL) 150 MG 24 hr tablet Take 1 tablet (150 mg total) by mouth daily. (Patient not taking: Reported on 04/06/2021) 90 tablet 0  . tamoxifen (NOLVADEX) 20 MG tablet Take 1 tablet (20 mg total) by mouth daily. (Patient not taking: Reported on 04/06/2021) 30 tablet 0  . Vitamin D, Ergocalciferol, (DRISDOL) 1.25 MG (50000 UNIT) CAPS capsule TAKE 1 CAPSULE (50,000 UNITS TOTAL) BY MOUTH EVERY 7 (SEVEN) DAYS (Patient not taking: No sig reported) 12 capsule 0   No current facility-administered medications on file prior to visit.    Review of Systems As in subjective      Objective:   Physical Exam Due to coronavirus pandemic stay at home measures, patient visit was virtual and they were not examined in person.    Ht 5\' 6"  (1.676 m)   Wt 170 lb (77.1 kg)   LMP 11/18/2008   BMI 27.44 kg/m   Wt Readings from Last 3 Encounters:  04/06/21 170 lb (77.1 kg)  03/22/21 170 lb 1.6 oz (77.2 kg)  03/13/21 170 lb (77.1 kg)    Gen: wd,nad No labored breathing or wheezing       Assessment:     Encounter Diagnoses  Name Primary?  . COVID-19 virus infection Yes  . Cough        Plan:       We discussed her diagnoses, potential for complications, overall timeframe to expect improvement, we discussed measures below.  General recommendations: I recommend you rest, hydrate well with water and clear fluids throughout the day.   You can use Tylenol for pain or fever You can use over the counter Tessalon Perles for cough as discussed. You can use over the counter Emetrol for  nausea.     She is very concerned about getting worse.  She is over 62 in would like to use packs livid.  Her risk factors include overweight and over age 21.  She specifically would like to try Paxlovid.  We discussed the medication proper use, risk and benefits of medication.  If you are having trouble breathing, if you are very weak, have high fever 103 or higher consistently despite Tylenol, or uncontrollable nausea and  vomiting, then call or go to the emergency department.    Covid symptoms such as fatigue and cough can linger over 2 weeks, even after the initial fever, aches, chills, and other initial symptoms.   Self Quarantine: The CDC, Centers for Disease Control has recommended a self quarantine of 5 days from the start of your illness until you are symptom-free including at least 24 hours of no symptoms including no fever, no shortness of breath, and no body aches and chills, by day 5 before returning to work or general contact with the public.  What does self quarantine mean: avoiding contact with people as much as possible.   Particularly in your house, isolate your self from others in a separate room, wear a mask when possible in the room, particularly if coughing a lot.   Have others bring food, water, medications, etc., to your door, but avoid direct contact with your household contacts during this time to avoid spreading the infection to them.   If you have a separate bathroom and living quarters during the next 2 weeks away from others, that would be preferable.    If you can't completely isolate, then wear a mask, wash hands frequently with soap and water for at least 15 seconds, minimize close contact with others, and have a friend or family member check regularly from a distance to make sure you are not getting seriously worse.     You should not be going out in public, should not be going to stores, to work or other public places until all your symptoms have resolved and at least  5 days + 24 hours of no symptoms at all have transpired.   Ideally you should avoid contact with others for a full 5 days if possible.  One of the goals is to limit spread to high risk people; people that are older and elderly, people with multiple health issues like diabetes, heart disease, lung disease, and anybody that has weakened immune systems such as people with cancer or on immunosuppressive therapy.    [place] was seen today for covid positive.  Diagnoses and all orders for this visit:  COVID-19 virus infection  Cough  Other orders -     Nirmatrelvir & Ritonavir (PAXLOVID) 20 x 150 MG & 10 x 100MG  TBPK; Take 3 tablets by mouth in the morning and at bedtime for 5 days. -     Multiple Vitamins-Minerals (EMERGEN-C IMMUNE PLUS) PACK; Take 1 tablet by mouth 2 (two) times daily. -     benzonatate (TESSALON) 200 MG capsule; Take 1 capsule (200 mg total) by mouth 3 (three) times daily as needed for cough.  f/u prn

## 2021-04-12 ENCOUNTER — Encounter: Payer: Self-pay | Admitting: Hematology and Oncology

## 2021-04-14 ENCOUNTER — Other Ambulatory Visit: Payer: Self-pay | Admitting: Family Medicine

## 2021-04-14 ENCOUNTER — Other Ambulatory Visit: Payer: Self-pay | Admitting: Hematology and Oncology

## 2021-04-14 DIAGNOSIS — F321 Major depressive disorder, single episode, moderate: Secondary | ICD-10-CM

## 2021-04-14 MED ORDER — BUPROPION HCL ER (XL) 150 MG PO TB24: 150.0000 mg | ORAL_TABLET | Freq: Every day | ORAL | 0 refills | Status: DC

## 2021-04-14 NOTE — Progress Notes (Signed)
She decided to not take the tamoxifen and wants back on the Welbutrin

## 2021-04-24 NOTE — Telephone Encounter (Signed)
Called pt and scheduled appointment for July for med check with Dr. Tomi Bamberger

## 2021-05-08 ENCOUNTER — Telehealth: Payer: Self-pay | Admitting: Hematology and Oncology

## 2021-05-08 ENCOUNTER — Other Ambulatory Visit: Payer: Self-pay | Admitting: Oncology

## 2021-05-08 NOTE — Telephone Encounter (Signed)
Scheduled appointment per 06/14 sch msg. Patient is aware. 

## 2021-05-09 ENCOUNTER — Other Ambulatory Visit: Payer: Self-pay | Admitting: Family Medicine

## 2021-05-09 DIAGNOSIS — F411 Generalized anxiety disorder: Secondary | ICD-10-CM

## 2021-05-09 DIAGNOSIS — F321 Major depressive disorder, single episode, moderate: Secondary | ICD-10-CM

## 2021-05-09 NOTE — Telephone Encounter (Signed)
Received refill request for the pts. Wellbutrin last apt 04/06/21 and next apt is 06/06/21. Lloks like it was just filled last month

## 2021-05-09 NOTE — Telephone Encounter (Signed)
Please check with pt.  Since my last conversation with her about this, she was [place] wellbutrin (related to starting tamoxifen).  See if she is still taking this, and if she needs refill before her July appt

## 2021-05-09 NOTE — Telephone Encounter (Signed)
She was written for #90 on 5/21 . This should be a 3 month supply, if only taking 1/d. If she had increased dose, then we can change to 300mg , but I don't think that was the plan. If her pharmacy only gave her #30, they should have refills left, as it looks like #90 was sent in for her.  She should check with her pharmacy.

## 2021-05-09 NOTE — Telephone Encounter (Signed)
Received refill request for pts. Citalopram pt. Last apt was 04/06/21 and next apt is 06/06/21

## 2021-05-10 ENCOUNTER — Inpatient Hospital Stay: Payer: 59 | Admitting: Hematology and Oncology

## 2021-05-22 NOTE — Progress Notes (Signed)
[facility] [address] [place] Phone: [phone] Subjective:   I Rachel Juarez am serving as a Education administrator for Dr. Hulan Saas.  This visit occurred during the SARS-CoV-2 public health emergency.  Safety protocols were in place, including screening questions prior to the visit, additional usage of staff PPE, and extensive cleaning of exam room while observing appropriate contact time as indicated for disinfecting solutions.   I'm seeing this patient by the request  of:  Rita Ohara, MD  CC: Fall with hip and ankle injury  [id number]  Rachel Juarez is a 65 y.o. female coming in with complaint of fall. Last seen for R foot pain in April. Patient states she was not paying attention when she fell down the step. Ankle is swollen, bruising on the lateral side. ROM is limited. States she did hear a pop. States the right knee may have been injured as well. States certain ranges of motion cause pain.   Onset- Fell Tuesday  Location - left foot Duration-  Character- Dull, achy, sore  Aggravating factors- walking, moving  Reliving factors-  Therapies tried- ice, elevation Severity- 10/10     Past Medical History:  Diagnosis Date   Allergy    Anxiety    Depression, major, in remission ([place]) 2006   when daughter dx'd with autoimmune disorder   Family history of breast cancer    Family history of cancer tonsil    Family history of thyroid cancer    Generalized anxiety disorder    generalized, and related to mammograms and flying   Glaucoma suspect    sees Dr. Katy Fitch, has all been okay, sees him qoyr   Herpes labialis    Hypercholesterolemia    high HDL, borderline LDL   Hypothyroidism    treated by Dr. Chalmers Cater   Multinodular goiter    followed by Dr. 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 Gillian Scarce   Social History   Socioeconomic History   Marital status: Married    Spouse name: Not on file   Number of children: Not on file   Years of education: Not on file   Highest education level: Not on file  Occupational History   Occupation: yoga teacher  Tobacco Use   Smoking status: Never   Smokeless tobacco: Never  Vaping Use   Vaping Use: Never used  Substance and Sexual Activity   Alcohol use: Yes    Alcohol/week: 0.0 standard drinks    Comment: 2 Vodka sodas/day   Drug use: No   Sexual activity: Yes    Partners: Male    Birth control/protection: Post-menopausal  Other Topics Concern   Not on file  Social History Narrative   Pre-COVID, taught yoga at multiple places.    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 this Fall.  Engaged to be married. (comes home for her IVIG treatments still)   1 dog (Malti-poo)   Volunteers at the [place] Strain: Not on file  Food Insecurity: Not on file  Transportation Needs: Not on file  Physical Activity: Not on file  Stress: Not on file  Social Connections: Not on file   Allergies  Allergen Reactions   Demerol [Meperidine] Other (See Comments)    Low bp, passes out   Family History  Problem Relation Age of Onset   Lupus Mother    Cancer Father 50       thymic cancer   COPD Father    Heart disease Father        CHF   Asthma Father    Cancer Sister    Breast cancer Sister 45       "best kind", lumpectomy and radiation   Dermatomyositis Daughter 12       juvenile form   Irritable bowel syndrome Daughter    Colon cancer Maternal Grandmother        80's   Cancer Maternal Grandmother    Hypertension Paternal Grandmother    Hyperlipidemia  Paternal Grandmother    Thyroid disease Paternal Grandmother        had goiter removed   Stroke Paternal Grandmother 53   Hypertension Paternal Grandfather    Hyperlipidemia Paternal Grandfather    Heart disease Paternal Grandfather        MI first in his 54's   Stroke Paternal Grandfather    Cancer Paternal Uncle        started on tonsils (smoker)   Breast cancer Paternal Aunt 57   Breast cancer Cousin 17   Breast cancer Cousin 35       (the mother of the 39 y/o cousin with br ca   Thyroid cancer Cousin    Diabetes Neg Hx    Ovarian cancer Neg Hx     Current Outpatient Medications (Endocrine & Metabolic):    levothyroxine (SYNTHROID, LEVOTHROID) 75 MCG tablet, Take 75 mcg by mouth daily before breakfast.  Current Outpatient Medications (Cardiovascular):    rosuvastatin (CRESTOR) 10 MG tablet, Take 1 tablet (10 mg total) by mouth daily.  Current Outpatient Medications (Respiratory):    benzonatate (TESSALON) 200 MG capsule, Take 1 capsule (200 mg total) by mouth 3 (three) times daily as needed for cough.   fexofenadine (ALLEGRA) 180 MG tablet, Take 180 mg by mouth daily.   montelukast (SINGULAIR) 10 MG tablet, Take 1 tablet (10 mg total) by mouth at bedtime.   Triamcinolone Acetonide (NASACORT AQ NA), Place 2 sprays into the nose daily.    Current Outpatient Medications (Other):    ALPRAZolam (XANAX) 0.5 MG tablet, Take 0.5-1 tablets (0.25-0.5 mg total) by mouth 3 (three) times daily as needed for anxiety or sleep.   buPROPion (WELLBUTRIN XL) 150 MG 24 hr tablet, Take 1 tablet (150 mg total) by mouth daily.   busPIRone (BUSPAR) 15 MG tablet, TAKE 0.5 TABLETS (7.5 MG TOTAL) BY MOUTH 2 (TWO) TIMES DAILY.   Calcium 250 MG CAPS, Take 2 capsules by mouth daily.   citalopram (CELEXA) 40 MG tablet, TAKE 1 TABLET BY MOUTH EVERY DAY   hydrocortisone 2.5 % ointment, Apply topically 2 (two) times daily.   Magnesium 500 MG TABS, Take 1 tablet by mouth daily.   MILK THISTLE PO, Take 1  capsule by mouth daily.   Misc Natural Products (TART CHERRY ADVANCED PO), Take 1 capsule by mouth daily.   Multiple Vitamins-Minerals (EMERGEN-C IMMUNE PLUS) PACK, Take 1 tablet by mouth 2 (two) times daily.   Multiple Vitamins-Minerals (MULTIVITAMIN WITH MINERALS) tablet, Take 1 tablet by mouth daily.   mupirocin ointment (  BACTROBAN) 2 %, Apply 1 application topically 2 (two) times daily.   NON FORMULARY, Take 2 capsules by mouth daily.   NON FORMULARY, Take 2 capsules by mouth daily.   Omega-3 Fatty Acids (FISH OIL) 1000 MG CPDR, Take 2,000 mg by mouth daily.   tamoxifen (NOLVADEX) 20 MG tablet, Take 1 tablet (20 mg total) by mouth daily.   valACYclovir (VALTREX) 1000 MG tablet, TAKE 2 TABLETS AT ONSET OF COLD SORE. REPEAT ONCE IN 12 HOURS (4 PILLS/COURSE)   vitamin C (ASCORBIC ACID) 500 MG tablet, Take 500 mg by mouth daily.   Vitamin D, Ergocalciferol, (DRISDOL) 1.25 MG (50000 UNIT) CAPS capsule, TAKE 1 CAPSULE (50,000 UNITS TOTAL) BY MOUTH EVERY 7 (SEVEN) DAYS   Vitamin D, Ergocalciferol, (DRISDOL) 1.25 MG (50000 UNIT) CAPS capsule, Take 1 capsule (50,000 Units total) by mouth every 7 (seven) days.   Reviewed prior external information including notes and imaging from  primary care provider As well as notes that were available from care everywhere and other healthcare systems.  Past medical history, social, surgical and family history all reviewed in electronic medical record.  No pertanent information unless stated regarding to the chief complaint.   Review of Systems:  No headache, visual changes, nausea, vomiting, diarrhea, constipation, dizziness, abdominal pain, skin rash, fevers, chills, night sweats, weight loss, swollen lymph nodes, body aches, chest pain, shortness of breath, mood changes. POSITIVE muscle aches, joint swelling  Objective  Blood pressure 120/78, pulse 67, height 5\' 6"  (1.676 m), weight 171 lb (77.6 kg), last menstrual period 11/18/2008, SpO2 100 %.   General:  No apparent distress alert and oriented x3 mood and affect normal, dressed appropriately.  HEENT: Pupils equal, extraocular movements intact  Respiratory: Patient's speak in full sentences and does not appear short of breath  Gait n severely antalgic MSK: Patient does have a significant swelling noted of the left ankle.  Severely tender to palpation over the lateral aspect of the ankle.  Seems to be mostly over the lateral malleolus and posteriorly.  Mild pain over the medial aspect as well.  Neurovascularly intact.  Significant swelling and bruising of the foot noted.  Limited muscular skeletal ultrasound was performed and interpreted by Hulan Saas, M  Limited ultrasound shows the patient does have what appears to be a cortical irregularity noted of the lateral malleolus.  He seems to have a very small avulsion noted as well.  Talar dome.  Trace effusion noted.  Medial malleolus seems to be unremarkable.  Questionable tear of the peroneal tendons noted Impression: Lateral malleolus fracture with possible peroneal tendon tear    Impression and Recommendations:     The above documentation has been reviewed and is accurate and complete Lyndal Pulley, DO

## 2021-05-24 ENCOUNTER — Other Ambulatory Visit: Payer: Self-pay

## 2021-05-24 ENCOUNTER — Encounter: Payer: Self-pay | Admitting: Family Medicine

## 2021-05-24 ENCOUNTER — Ambulatory Visit (INDEPENDENT_AMBULATORY_CARE_PROVIDER_SITE_OTHER): Payer: 59

## 2021-05-24 ENCOUNTER — Ambulatory Visit: Payer: Self-pay

## 2021-05-24 ENCOUNTER — Ambulatory Visit: Payer: 59 | Admitting: Family Medicine

## 2021-05-24 VITALS — BP 120/78 | HR 67 | Ht 66.0 in | Wt 171.0 lb

## 2021-05-24 DIAGNOSIS — S8265XA Nondisplaced fracture of lateral malleolus of left fibula, initial encounter for closed fracture: Secondary | ICD-10-CM | POA: Diagnosis not present

## 2021-05-24 DIAGNOSIS — M79672 Pain in left foot: Secondary | ICD-10-CM | POA: Diagnosis not present

## 2021-05-24 DIAGNOSIS — S8262XA Displaced fracture of lateral malleolus of left fibula, initial encounter for closed fracture: Secondary | ICD-10-CM | POA: Insufficient documentation

## 2021-05-24 MED ORDER — VITAMIN D (ERGOCALCIFEROL) 1.25 MG (50000 UNIT) PO CAPS: 50000.0000 [IU] | ORAL_CAPSULE | ORAL | 0 refills | Status: DC

## 2021-05-24 NOTE — Assessment & Plan Note (Signed)
Patient on ultrasound has what appears to be a nondisplaced lateral malleolus fracture noted.  X-rays show that there is could be potentially mildly intra-articular.  Overread shows medial talar dome injury medially but I did not see that on exam.  Patient does have significant swelling.  Patient at this point has more pain in the short cam walker.  We will use a long cam walker at this point but in 1 week try to transition out.  We discussed range of motion, topical anti-inflammatories and icing regimen.  Follow-up with me again in 2 weeks.

## 2021-05-24 NOTE — Patient Instructions (Signed)
Good to see you Once weekly  vitamin D See me again in 2 weeks ok to double book

## 2021-05-29 ENCOUNTER — Encounter: Payer: Self-pay | Admitting: Family Medicine

## 2021-05-30 MED ORDER — DOXYCYCLINE HYCLATE 100 MG PO TABS: 100.0000 mg | ORAL_TABLET | Freq: Two times a day (BID) | ORAL | 0 refills | Status: DC

## 2021-06-02 ENCOUNTER — Other Ambulatory Visit: Payer: Self-pay | Admitting: Family Medicine

## 2021-06-02 DIAGNOSIS — F411 Generalized anxiety disorder: Secondary | ICD-10-CM

## 2021-06-02 DIAGNOSIS — F321 Major depressive disorder, single episode, moderate: Secondary | ICD-10-CM

## 2021-06-05 NOTE — Progress Notes (Signed)
Chief Complaint  Patient presents with   Anxiety    Med check. No new concerns.     F/u anxiety and depression: She was started on Buspar in April.  We had spoken after she had been on Buspar for 1-2 weeks, taking 1/2 tab BID. She was tolerating it well.  Wellbutrin was to be weaned off due to starting tamoxifen, continuing on citalopram.  She ended up deciding against starting tamoxifen, still getting second opinions (spoke to Dr. Jana Hakim, and seeking an additional opinion).    She was sick in May with COVID, treated with Paxlovid.  At that time she was off the Wellbutrin. She was very weepy, irritable, "very unhappy", "not in a good place". She spoke to Dr. Redmond School and restarted Wellbutrin in May.  She is asking my opinion regarding psych meds, tamoxifen.  She is still seeing therapist.  She states that a friend recently told her she didn't seem as joyful/happy since she had [place].    PMH, PSH, SH reviewed   Outpatient Encounter Medications as of 06/06/2021  Medication Sig Note   buPROPion (WELLBUTRIN XL) 150 MG 24 hr tablet Take 1 tablet (150 mg total) by mouth daily.    busPIRone (BUSPAR) 15 MG tablet TAKE 0.5 TABLETS (7.5 MG TOTAL) BY MOUTH 2 (TWO) TIMES DAILY.    Calcium 250 MG CAPS Take 2 capsules by mouth daily. 01/26/2020: Takes 1 calcium tablet daily (unsure of dose)   citalopram (CELEXA) 40 MG tablet TAKE 1 TABLET BY MOUTH EVERY DAY    fexofenadine (ALLEGRA) 180 MG tablet Take 180 mg by mouth daily.    hydrocortisone 2.5 % ointment Apply topically 2 (two) times daily.    levothyroxine (SYNTHROID, LEVOTHROID) 75 MCG tablet Take 75 mcg by mouth daily before breakfast.    Magnesium 500 MG TABS Take 1 tablet by mouth daily.    MILK THISTLE PO Take 1 capsule by mouth daily.    Misc Natural Products (TART CHERRY ADVANCED PO) Take 1 capsule by mouth daily.    montelukast (SINGULAIR) 10 MG tablet Take 1 tablet (10 mg total) by mouth at bedtime.    Multiple Vitamins-Minerals  (MULTIVITAMIN WITH MINERALS) tablet Take 1 tablet by mouth daily.    NON FORMULARY Take 2 capsules by mouth daily. 01/26/2020: DoTerra On Guard   NON FORMULARY Take 2 capsules by mouth daily. 01/26/2020: DoTerra Zendocrine   Omega-3 Fatty Acids (FISH OIL) 1000 MG CPDR Take 2,000 mg by mouth daily.    rosuvastatin (CRESTOR) 10 MG tablet Take 1 tablet (10 mg total) by mouth daily.    Triamcinolone Acetonide (NASACORT AQ NA) Place 2 sprays into the nose daily.    vitamin C (ASCORBIC ACID) 500 MG tablet Take 500 mg by mouth daily.    Vitamin D, Ergocalciferol, (DRISDOL) 1.25 MG (50000 UNIT) CAPS capsule Take 1 capsule (50,000 Units total) by mouth every 7 (seven) days.    [DISCONTINUED] Vitamin D, Ergocalciferol, (DRISDOL) 1.25 MG (50000 UNIT) CAPS capsule TAKE 1 CAPSULE (50,000 UNITS TOTAL) BY MOUTH EVERY 7 (SEVEN) DAYS    ALPRAZolam (XANAX) 0.5 MG tablet Take 0.5-1 tablets (0.25-0.5 mg total) by mouth 3 (three) times daily as needed for anxiety or sleep. (Patient not taking: Reported on 06/06/2021)    mupirocin ointment (BACTROBAN) 2 % Apply 1 application topically 2 (two) times daily. (Patient not taking: Reported on 06/06/2021)    tamoxifen (NOLVADEX) 20 MG tablet Take 1 tablet (20 mg total) by mouth daily. (Patient not taking: Reported on 06/06/2021)  valACYclovir (VALTREX) 1000 MG tablet TAKE 2 TABLETS AT ONSET OF COLD SORE. REPEAT ONCE IN 12 HOURS (4 PILLS/COURSE) (Patient not taking: Reported on 06/06/2021)    [DISCONTINUED] benzonatate (TESSALON) 200 MG capsule Take 1 capsule (200 mg total) by mouth 3 (three) times daily as needed for cough.    [DISCONTINUED] doxycycline (VIBRA-TABS) 100 MG tablet Take 1 tablet (100 mg total) by mouth 2 (two) times daily for 10 days.    [DISCONTINUED] Multiple Vitamins-Minerals (EMERGEN-C IMMUNE PLUS) PACK Take 1 tablet by mouth 2 (two) times daily.    No facility-administered encounter medications on file as of 06/06/2021.   Allergies  Allergen Reactions    Demerol [Meperidine] Other (See Comments)    Low bp, passes out   ROS: no fever, chills, headaches, dizziness, URI symptoms, chest pain, shortness of breath, GI or GU complaints. Broke her L foot, in a boot, having some foot pain. Moods per HPI.   PHYSICAL EXAM:  BP 112/64   Pulse 68   Ht 5\' 6"  (1.676 m)   Wt 170 lb 12.8 oz (77.5 kg)   LMP 11/18/2008   BMI 27.57 kg/m   Wt Readings from Last 3 Encounters:  06/06/21 170 lb 12.8 oz (77.5 kg)  05/24/21 171 lb (77.6 kg)  04/06/21 170 lb (77.1 kg)   Pleasant female, on exam table with her boot off--foot was aching, taking a break out of the boot. She is alert, oriented. She has normal eye contact, speech. Normal hygiene and grooming. Anxious mood, full range of affect. Heart: regular rate and rhythm Lungs: clear bilaterally Neck: no lymphadenopathy, thyromegaly or mass  PHQ-9 score of 9 GAD-7 score of 14  ASSESSMENT/PLAN:  Generalized anxiety disorder - suboptimally controlled. Titrate up the buspar to 15mg  BID, cont 40mg  citalopram, and cont counseling  Depression, major, single episode, moderate ([place]) - improved some since restarting wellbutrin. Cont wellbutrin, citalopram and counseling.  At risk for breast cancer - discussed that decision to start tamoxifen can be made later. And if she isn't able to stay off wellbutrin, tamoxifen levels lower, still may benefit.  Titrate up buspar to 1 BID, reviewed how.   Counseled regarding tamoxifen, wellbutrin. All questions answered No rush to start, when still struggling with anxiety Interaction with wellbutrin is lowering the tamoxifen levels--may still have some benefit (vs none, no serious harmful interaction).  F/u at CPE, sooner prn   I spent 31 minutes dedicated to the care of this patient, including pre-visit review of records, face to face time, post-visit ordering of testing and documentation.

## 2021-06-05 NOTE — Progress Notes (Signed)
[facility] [place] Phone: [phone] Subjective:   Rachel Juarez, am serving as a scribe for Dr. Hulan Saas.   I'm seeing this patient by the request  of:  Rita Ohara, MD  CC: left ankle pain follow up   [id number]  05/24/2021 Patient on ultrasound has what appears to be a nondisplaced lateral malleolus fracture noted.  X-rays show that there is could be potentially mildly intra-articular.  Overread shows medial talar dome injury medially but I did not see that on exam.  Patient does have significant swelling.  Patient at this point has more pain in the short cam walker.  We will use a long cam walker at this point but in 1 week try to transition out.  We discussed range of motion, topical anti-inflammatories and icing regimen.  Follow-up with me again in 2 weeks.  Update 06/08/2021 Shulamit Donofrio is a 65 y.o. female coming in with complaint of L foot pain. Patient states L foot feels better, abrasion is healing, but does have pain in he L shin.  Patient states that she does feel approximately 50 to 60% better.  Still has swelling that is somewhat concerning.  No calf pain no noted.  Patient in the interim started having increasing redness that could have been secondary to a cellulitis and was started on antibiotics.     Past Medical History:  Diagnosis Date   Allergy    Anxiety    Depression, major, in remission ([place]) 2006   when daughter dx'd with autoimmune disorder   Family history of breast cancer    Family history of cancer tonsil    Family history of thyroid cancer    Generalized anxiety disorder    generalized, and related to mammograms and flying   Glaucoma suspect    sees Dr. Katy Fitch, has all been okay, sees him qoyr   Herpes labialis    Hypercholesterolemia    high HDL, borderline LDL   Hypothyroidism    treated by Dr. Chalmers Cater   Multinodular goiter    followed by Dr. Chalmers Cater; u/s q2 yr   PPD  positive 1988   treated x 6 mos of INH   Seasonal allergies    takes singulair year-round; worse in the Fall   Past Surgical History:  Procedure Laterality Date   CATARACT EXTRACTION, BILATERAL Bilateral June/July 2021   Dr. Gillian Scarce   Social History   Socioeconomic History   Marital status: Married    Spouse name: Not on file   Number of children: Not on file   Years of education: Not on file   Highest education level: Not on file  Occupational History   Occupation: yoga teacher  Tobacco Use   Smoking status: Never   Smokeless tobacco: Never  Vaping Use   Vaping Use: Never used  Substance and Sexual Activity   Alcohol use: Yes    Alcohol/week: 0.0 standard drinks    Comment: 2 Vodka sodas/day   Drug use: No   Sexual activity: Yes    Partners: Male    Birth control/protection: Post-menopausal  Other Topics Concern   Not on file  Social History Narrative   Pre-COVID, taught yoga at multiple places.    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  Engaged to be married. (  comes home for her IVIG treatments still)   1 dog (Malti[employer]poo)   Volunteers at [employer] Strain: Not on file  Food Insecurity: Not on file  Transportation Needs: Not on file  Physical Activity: Not on file  Stress: Not on file  Social Connections: Not on file   Allergies  Allergen Reactions   Demerol [Meperidine] Other (See Comments)    Low bp, passes out   Family History  Problem Relation Age of Onset   Lupus Mother    Cancer Father 68       thymic cancer   COPD Father    Heart disease Father        CHF   Asthma Father    Cancer Sister    Breast cancer Sister 34       "best kind", lumpectomy and radiation   Dermatomyositis Daughter 58       juvenile form   Irritable bowel syndrome  Daughter    Colon cancer Maternal Grandmother        80's   Cancer Maternal Grandmother    Hypertension Paternal Grandmother    Hyperlipidemia Paternal Grandmother    Thyroid disease Paternal Grandmother        had goiter removed   Stroke Paternal Grandmother 24   Hypertension Paternal Grandfather    Hyperlipidemia Paternal Grandfather    Heart disease Paternal Grandfather        MI first in his 32's   Stroke Paternal Grandfather    Cancer Paternal Uncle        started on tonsils (smoker)   Breast cancer Paternal Aunt 77   Breast cancer Cousin 65   Breast cancer Cousin 3       (the mother of the 22 y/o cousin with br ca   Thyroid cancer Cousin    Diabetes Neg Hx    Ovarian cancer Neg Hx     Current Outpatient Medications (Endocrine & Metabolic):    levothyroxine (SYNTHROID, LEVOTHROID) 75 MCG tablet, Take 75 mcg by mouth daily before breakfast.  Current Outpatient Medications (Cardiovascular):    rosuvastatin (CRESTOR) 10 MG tablet, Take 1 tablet (10 mg total) by mouth daily.  Current Outpatient Medications (Respiratory):    fexofenadine (ALLEGRA) 180 MG tablet, Take 180 mg by mouth daily.   montelukast (SINGULAIR) 10 MG tablet, Take 1 tablet (10 mg total) by mouth at bedtime.   Triamcinolone Acetonide (NASACORT AQ NA), Place 2 sprays into the nose daily.    Current Outpatient Medications (Other):    ALPRAZolam (XANAX) 0.5 MG tablet, Take 0.5-1 tablets (0.25-0.5 mg total) by mouth 3 (three) times daily as needed for anxiety or sleep. (Patient not taking: Reported on 06/06/2021)   buPROPion (WELLBUTRIN XL) 150 MG 24 hr tablet, Take 1 tablet (150 mg total) by mouth daily.   busPIRone (BUSPAR) 15 MG tablet, TAKE 0.5 TABLETS (7.5 MG TOTAL) BY MOUTH 2 (TWO) TIMES DAILY.   Calcium 250 MG CAPS, Take 2 capsules by mouth daily.   citalopram (CELEXA) 40 MG tablet, TAKE 1 TABLET BY MOUTH EVERY DAY   hydrocortisone 2.5 % ointment, Apply topically 2 (two) times daily.   Magnesium 500  MG TABS, Take 1 tablet by mouth daily.   MILK THISTLE PO, Take 1 capsule by mouth daily.   Misc Natural Products (TART CHERRY ADVANCED PO), Take 1 capsule by mouth daily.   Multiple Vitamins-Minerals (MULTIVITAMIN WITH MINERALS) tablet, Take 1 tablet by  mouth daily.   mupirocin ointment (BACTROBAN) 2 %, Apply 1 application topically 2 (two) times daily. (Patient not taking: Reported on 06/06/2021)   NON FORMULARY, Take 2 capsules by mouth daily.   NON FORMULARY, Take 2 capsules by mouth daily.   Omega-3 Fatty Acids (FISH OIL) 1000 MG CPDR, Take 2,000 mg by mouth daily.   tamoxifen (NOLVADEX) 20 MG tablet, Take 1 tablet (20 mg total) by mouth daily. (Patient not taking: Reported on 06/06/2021)   valACYclovir (VALTREX) 1000 MG tablet, TAKE 2 TABLETS AT ONSET OF COLD SORE. REPEAT ONCE IN 12 HOURS (4 PILLS/COURSE) (Patient not taking: Reported on 06/06/2021)   vitamin C (ASCORBIC ACID) 500 MG tablet, Take 500 mg by mouth daily.   Vitamin D, Ergocalciferol, (DRISDOL) 1.25 MG (50000 UNIT) CAPS capsule, Take 1 capsule (50,000 Units total) by mouth every 7 (seven) days.   Reviewed prior external information including notes and imaging from  primary care provider As well as notes that were available from care everywhere and other healthcare systems.  Past medical history, social, surgical and family history all reviewed in electronic medical record.  No pertanent information unless stated regarding to the chief complaint.   Review of Systems:  No headache, visual changes, nausea, vomiting, diarrhea, constipation, dizziness, abdominal pain, skin rash, fevers, chills, night sweats, weight loss, swollen lymph nodes, body aches,  chest pain, shortness of breath, mood changes. POSITIVE muscle aches, joint swelling  Objective  Blood pressure 122/82, pulse 72, height 5\' 6"  (1.676 m), last menstrual period 11/18/2008, SpO2 96 %.   General: No apparent distress alert and oriented x3 mood and affect normal,  dressed appropriately.  HEENT: Pupils equal, extraocular movements intact  Respiratory: Patient's speak in full sentences and does not appear short of breath  Gait antalgic Patient's left ankle still has swelling noted.  Abrasion does seem to be somewhat better with good granulation tissue surrounding the area but still has darkening area.  No sign of any infectious etiology.  Patient does have stiffness noted.  Mild tenderness still over the anterior and lateral aspect of the malleolus but not on the posterior aspect.  No pain over the proximal aspect of the fibular head.  No pain over the medial malleolus.   Limited musculoskeletal ultrasound was performed and interpreted by Lyndal Pulley  Limited ultrasound of patient's left ankle shows the ATFL seems likely does have a fairly high-grade tear noted distally with hypoechoic changes surrounding the area.  Mild abnormality noted of the talar dome anteriorly.  No intra-articular effusion noted though.   Impression and Recommendations:    The above documentation has been reviewed and is accurate and complete Lyndal Pulley, DO

## 2021-06-06 ENCOUNTER — Encounter: Payer: Self-pay | Admitting: Family Medicine

## 2021-06-06 ENCOUNTER — Ambulatory Visit: Payer: 59 | Admitting: Family Medicine

## 2021-06-06 ENCOUNTER — Other Ambulatory Visit: Payer: Self-pay

## 2021-06-06 VITALS — BP 112/64 | HR 68 | Ht 66.0 in | Wt 170.8 lb

## 2021-06-06 DIAGNOSIS — F411 Generalized anxiety disorder: Secondary | ICD-10-CM

## 2021-06-06 DIAGNOSIS — F321 Major depressive disorder, single episode, moderate: Secondary | ICD-10-CM | POA: Diagnosis not present

## 2021-06-06 DIAGNOSIS — Z9189 Other specified personal risk factors, not elsewhere classified: Secondary | ICD-10-CM | POA: Diagnosis not present

## 2021-06-06 NOTE — Patient Instructions (Addendum)
Take a full tablet of buspar once in the morning and 1/2 tablet in the evening. If tolerating, after a week, increase to the full tablet twice daily.  This should help a little more with the anxiety.  Continue to meet regularly with your therapist.  We didn't send in any prescriptions today. You should have 1 month left of 2 of the meds, and only 2 weeks left of the buspar (since we are increasing the dose). It makes sense to either look into a way to get 90 day prescriptions (see if there is a preferred pharmacy or if you need to do this via mail order), versus using a more convenient pharmacy for 30 days refills (ie [facility]) rather than paying so much out of pocket.

## 2021-06-08 ENCOUNTER — Other Ambulatory Visit: Payer: Self-pay

## 2021-06-08 ENCOUNTER — Ambulatory Visit: Payer: 59 | Admitting: Family Medicine

## 2021-06-08 ENCOUNTER — Encounter: Payer: Self-pay | Admitting: Family Medicine

## 2021-06-08 DIAGNOSIS — S8265XA Nondisplaced fracture of lateral malleolus of left fibula, initial encounter for closed fracture: Secondary | ICD-10-CM | POA: Diagnosis not present

## 2021-06-08 NOTE — Assessment & Plan Note (Addendum)
Patient is improving at this time.  We discussed with patient icing regimen and home exercises.  Continue cam walker for another 2 weeks.  She still has some soft tissue swelling and does still have the abrasion on the ankle that seems to be getting better but slowly.  Patient given exercises to start again in 2 weeks patient then will see me again in 3 weeks and hopefully we will be able to progress accordingly.  Did discuss x-rays but with patient making improvement does not think it would change significant treatment at this time.

## 2021-06-08 NOTE — Patient Instructions (Addendum)
Good to see you  Continue the boot for another 2 weeks After one week try a shoe in the house but be careful  When sitting elevate foot and spell alphabet with ankle  Start ankle exercises with band in 2 week See me again in 3 okay to overbook

## 2021-06-12 ENCOUNTER — Encounter: Payer: Self-pay | Admitting: Family Medicine

## 2021-06-12 DIAGNOSIS — F411 Generalized anxiety disorder: Secondary | ICD-10-CM

## 2021-06-12 DIAGNOSIS — F321 Major depressive disorder, single episode, moderate: Secondary | ICD-10-CM

## 2021-06-12 MED ORDER — BUPROPION HCL ER (XL) 150 MG PO TB24: 150.0000 mg | ORAL_TABLET | Freq: Every day | ORAL | 2 refills | Status: DC

## 2021-06-12 MED ORDER — CITALOPRAM HYDROBROMIDE 40 MG PO TABS: 40.0000 mg | ORAL_TABLET | Freq: Every day | ORAL | 2 refills | Status: DC

## 2021-06-12 MED ORDER — BUSPIRONE HCL 15 MG PO TABS: 15.0000 mg | ORAL_TABLET | Freq: Two times a day (BID) | ORAL | 2 refills | Status: DC

## 2021-06-12 MED ORDER — BUSPIRONE HCL 15 MG PO TABS: 15.0000 mg | ORAL_TABLET | Freq: Two times a day (BID) | ORAL | 0 refills | Status: AC

## 2021-06-21 ENCOUNTER — Encounter: Payer: Self-pay | Admitting: Family Medicine

## 2021-06-26 ENCOUNTER — Ambulatory Visit: Payer: 59 | Admitting: Hematology and Oncology

## 2021-06-29 ENCOUNTER — Ambulatory Visit (INDEPENDENT_AMBULATORY_CARE_PROVIDER_SITE_OTHER): Payer: 59 | Admitting: Family Medicine

## 2021-06-29 ENCOUNTER — Other Ambulatory Visit: Payer: Self-pay

## 2021-06-29 ENCOUNTER — Ambulatory Visit (INDEPENDENT_AMBULATORY_CARE_PROVIDER_SITE_OTHER): Payer: 59

## 2021-06-29 ENCOUNTER — Ambulatory Visit: Payer: Self-pay

## 2021-06-29 ENCOUNTER — Encounter: Payer: Self-pay | Admitting: Family Medicine

## 2021-06-29 VITALS — BP 114/80 | HR 85 | Ht 66.0 in | Wt 170.0 lb

## 2021-06-29 DIAGNOSIS — M25572 Pain in left ankle and joints of left foot: Secondary | ICD-10-CM

## 2021-06-29 DIAGNOSIS — S8265XA Nondisplaced fracture of lateral malleolus of left fibula, initial encounter for closed fracture: Secondary | ICD-10-CM | POA: Diagnosis not present

## 2021-06-29 MED ORDER — DOXYCYCLINE HYCLATE 100 MG PO TABS
100.0000 mg | ORAL_TABLET | Freq: Two times a day (BID) | ORAL | 0 refills | Status: DC
Start: 2021-06-29 — End: 2022-02-14

## 2021-06-29 NOTE — Patient Instructions (Addendum)
Good to see you  X ray on way out Doxycycline '100mg'$  BID for ten days  Keep with ROM Okay for very small wedge  Continue Exercises bike or elliptical at 50% of what you would think you could do   See me again in 4-5 weeks

## 2021-06-29 NOTE — Progress Notes (Signed)
[place] [facility] Phone: [phone] Subjective:   Rachel Juarez, am serving as a scribe for Dr. Hulan Saas.  This visit occurred during the SARS-CoV-2 public health emergency.  Safety protocols were in place, including screening questions prior to the visit, additional usage of staff PPE, and extensive cleaning of exam room while observing appropriate contact time as indicated for disinfecting solutions.   I'm seeing this patient by the request  of:  Rita Ohara, MD  CC: Ankle pain follow-up  [id number]  06/08/2021 Patient is improving at this time.  We discussed with patient icing regimen and home exercises.  Continue cam walker for another 2 weeks.  She still has some soft tissue swelling and does still have the abrasion on the ankle that seems to be getting better but slowly.  Patient given exercises to start again in 2 weeks patient then will see me again in 3 weeks and hopefully we will be able to progress accordingly.  Did discuss x-rays but with patient making improvement does not think it would change significant treatment at this time.  Update 06/29/2021 Rachel Juarez is a 64 y.o. female coming in with complaint of ankle pain.  Found to have a closed lateral nondisplaced fracture of the lateral malleolus.  Patient was starting to transition into a shoe.  Patient was also started on the recumbent bike.  Patient states that she still has swelling in L ankle. Pain only occuring with pressure over malleolus from shoe. Wearing recovery sandals in the house. Came out of boot last week. Continues to endorse antalgic gait despite despite minimal pain.       Past Medical History:  Diagnosis Date   Allergy    Anxiety    Depression, major, in remission ([place]) 2006   when daughter dx'd with autoimmune disorder   Family history of breast cancer    Family history of cancer tonsil    Family history of thyroid cancer     Generalized anxiety disorder    generalized, and related to mammograms and flying   Glaucoma suspect    sees Dr. Katy Fitch, has all been okay, sees him qoyr   Herpes labialis    Hypercholesterolemia    high HDL, borderline LDL   Hypothyroidism    treated by Dr. Chalmers Cater   Multinodular goiter    followed by Dr. 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 Gillian Scarce   Social History   Socioeconomic History   Marital status: Married    Spouse name: Not on file   Number of children: Not on file   Years of education: Not on file   Highest education level: Not on file  Occupational History   Occupation: yoga teacher  Tobacco Use   Smoking status: Never   Smokeless tobacco: Never  Vaping Use   Vaping Use: Never used  Substance and Sexual Activity   Alcohol use: Yes    Alcohol/week: 0.0 standard drinks    Comment: 2 Vodka sodas/day   Drug use: Juarez   Sexual activity: Yes    Partners: Male    Birth control/protection: Post-menopausal  Other Topics Concern   Not on file  Social History Narrative  Pre-COVID, taught yoga at multiple places.    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  Engaged to be married. (comes home for her IVIG treatments still)   1 dog (Malti-poo)   Volunteers at the [employer] Strain: Not on file  Food Insecurity: Not on file  Transportation Needs: Not on file  Physical Activity: Not on file  Stress: Not on file  Social Connections: Not on file   Allergies  Allergen Reactions   Demerol [Meperidine] Other (See Comments)    Low bp, passes out   Family  History  Problem Relation Age of Onset   Lupus Mother    Cancer Father 25       thymic cancer   COPD Father    Heart disease Father        CHF   Asthma Father    Cancer Sister    Breast cancer Sister 28       "best kind", lumpectomy and radiation   Dermatomyositis Daughter 45       juvenile form   Irritable bowel syndrome Daughter    Colon cancer Maternal Grandmother        80's   Cancer Maternal Grandmother    Hypertension Paternal Grandmother    Hyperlipidemia Paternal Grandmother    Thyroid disease Paternal Grandmother        had goiter removed   Stroke Paternal Grandmother 30   Hypertension Paternal Grandfather    Hyperlipidemia Paternal Grandfather    Heart disease Paternal Grandfather        MI first in his 39's   Stroke Paternal Grandfather    Cancer Paternal Uncle        started on tonsils (smoker)   Breast cancer Paternal Aunt 73   Breast cancer Cousin 79   Breast cancer Cousin 17       (the mother of the 51 y/o cousin with br ca   Thyroid cancer Cousin    Diabetes Neg Hx    Ovarian cancer Neg Hx     Current Outpatient Medications (Endocrine & Metabolic):    levothyroxine (SYNTHROID, LEVOTHROID) 75 MCG tablet, Take 75 mcg by mouth daily before breakfast.  Current Outpatient Medications (Cardiovascular):    rosuvastatin (CRESTOR) 10 MG tablet, Take 1 tablet (10 mg total) by mouth daily.  Current Outpatient Medications (Respiratory):    fexofenadine (ALLEGRA) 180 MG tablet, Take 180 mg by mouth daily.   montelukast (SINGULAIR) 10 MG tablet, Take 1 tablet (10 mg total) by mouth at bedtime.   Triamcinolone Acetonide (NASACORT AQ NA), Place 2 sprays into the nose daily.    Current Outpatient Medications (Other):    ALPRAZolam (XANAX) 0.5 MG tablet, Take 0.5-1 tablets (0.25-0.5 mg total) by mouth 3 (three) times daily as needed for anxiety or sleep.   buPROPion (WELLBUTRIN XL) 150 MG 24 hr tablet, Take 1 tablet (150 mg total) by mouth daily.   busPIRone  (BUSPAR) 15 MG tablet, Take 1 tablet (15 mg total) by mouth 2 (two) times daily.   busPIRone (BUSPAR) 15 MG tablet, Take 1 tablet (15 mg total) by mouth 2 (two) times daily.   Calcium 250 MG CAPS, Take 2 capsules by mouth daily.   citalopram (CELEXA) 40 MG tablet, Take 1 tablet (40 mg total) by mouth  daily.   doxycycline (VIBRA-TABS) 100 MG tablet, Take 1 tablet (100 mg total) by mouth 2 (two) times daily.   hydrocortisone 2.5 % ointment, Apply topically 2 (two) times daily.   Magnesium 500 MG TABS, Take 1 tablet by mouth daily.   MILK THISTLE PO, Take 1 capsule by mouth daily.   Misc Natural Products (TART CHERRY ADVANCED PO), Take 1 capsule by mouth daily.   Multiple Vitamins-Minerals (MULTIVITAMIN WITH MINERALS) tablet, Take 1 tablet by mouth daily.   mupirocin ointment (BACTROBAN) 2 %, Apply 1 application topically 2 (two) times daily.   NON FORMULARY, Take 2 capsules by mouth daily.   NON FORMULARY, Take 2 capsules by mouth daily.   Omega-3 Fatty Acids (FISH OIL) 1000 MG CPDR, Take 2,000 mg by mouth daily.   tamoxifen (NOLVADEX) 20 MG tablet, Take 1 tablet (20 mg total) by mouth daily.   valACYclovir (VALTREX) 1000 MG tablet, TAKE 2 TABLETS AT ONSET OF COLD SORE. REPEAT ONCE IN 12 HOURS (4 PILLS/COURSE)   vitamin C (ASCORBIC ACID) 500 MG tablet, Take 500 mg by mouth daily.   Vitamin D, Ergocalciferol, (DRISDOL) 1.25 MG (50000 UNIT) CAPS capsule, Take 1 capsule (50,000 Units total) by mouth every 7 (seven) days.   Reviewed prior external information including notes and imaging from  primary care provider As well as notes that were available from care everywhere and other healthcare systems.  Past medical history, social, surgical and family history all reviewed in electronic medical record.  Juarez pertanent information unless stated regarding to the chief complaint.   Review of Systems:  Juarez headache, visual changes, nausea, vomiting, diarrhea, constipation, dizziness, abdominal pain, skin  rash, fevers, chills, night sweats, weight loss, swollen lymph nodes, body aches, joint swelling, chest pain, shortness of breath, mood changes. POSITIVE muscle aches  Objective  Blood pressure 114/80, pulse 85, height '5\' 6"'$  (1.676 m), weight 170 lb (77.1 kg), last menstrual period 11/18/2008, SpO2 97 %.   General: Juarez apparent distress alert and oriented x3 mood and affect normal, dressed appropriately.  HEENT: Pupils equal, extraocular movements intact  Respiratory: Patient's speak in full sentences and does not appear short of breath  Cardiovascular: Juarez lower extremity edema, non tender, Juarez erythema  Gait mild antalgic gait MSK: Left ankle exam shows the patient does still have some mild inflammation Patient is still tender over the lateral malleolus.  Patient has mild pain over the peroneal tendon as well.  Limited muscular skeletal ultrasound was performed and interpreted by Hulan Saas, M  Limited musculoskeletal ultrasound shows the patient still has soft tissue hypoechoic changes and increasing in Doppler flow that is consistent with questionable cellulitis.  Patient does have callus formation over the lateral malleolus that was noted where the fracture was previously.  Patient still has mild irregularity noted at the talar dome but does seem to be more chronic with significant decrease in hypoechoic changes.  She does have a partial peroneal tendon noted. Impression: Cellulitis questionable but interval healing of the bone fracture.   Impression and Recommendations:     The above documentation has been reviewed and is accurate and complete Lyndal Pulley, DO

## 2021-06-29 NOTE — Assessment & Plan Note (Signed)
Patient had a fracture previously.  Still having some difficulty with ambulation and encouraged her to try to push with her regular walking or we will need to consider the possibility of formal physical therapy.  Patient wants to avoid formal physical therapy with which she had to go through with the contralateral side.  Patient will increase activity slowly.  Follow-up with me again in 4 weeks  Patient did have some mild soft tissue swelling still noted so we will do another course of doxycycline with findings noted possible cellulitis.

## 2021-07-05 ENCOUNTER — Other Ambulatory Visit: Payer: Self-pay | Admitting: Family Medicine

## 2021-07-05 ENCOUNTER — Telehealth: Payer: Self-pay | Admitting: *Deleted

## 2021-07-05 DIAGNOSIS — B001 Herpesviral vesicular dermatitis: Secondary | ICD-10-CM

## 2021-07-05 DIAGNOSIS — F321 Major depressive disorder, single episode, moderate: Secondary | ICD-10-CM

## 2021-07-05 DIAGNOSIS — F411 Generalized anxiety disorder: Secondary | ICD-10-CM

## 2021-07-05 MED ORDER — VALACYCLOVIR HCL 1 G PO TABS: ORAL_TABLET | ORAL | 0 refills | Status: DC

## 2021-07-05 NOTE — Addendum Note (Signed)
Addended by: Rita Ohara on: 07/05/2021 11:18 AM   Modules accepted: Orders

## 2021-07-05 NOTE — Telephone Encounter (Signed)
Patient called requesting refill of valacyclovir to [facility].

## 2021-07-05 NOTE — Telephone Encounter (Signed)
done

## 2021-07-08 ENCOUNTER — Other Ambulatory Visit: Payer: Self-pay | Admitting: Family Medicine

## 2021-07-08 DIAGNOSIS — F321 Major depressive disorder, single episode, moderate: Secondary | ICD-10-CM

## 2021-07-09 ENCOUNTER — Other Ambulatory Visit: Payer: Self-pay | Admitting: Family Medicine

## 2021-07-09 DIAGNOSIS — F411 Generalized anxiety disorder: Secondary | ICD-10-CM

## 2021-07-09 NOTE — Telephone Encounter (Signed)
No--she had 90d supply sent to [facility] in 05/2021

## 2021-07-09 NOTE — Telephone Encounter (Signed)
[facility] is requesting to fill pt well[facility]rin. Please advise [facility] 

## 2021-07-25 ENCOUNTER — Ambulatory Visit: Payer: 59 | Admitting: Dermatology

## 2021-07-25 ENCOUNTER — Encounter: Payer: Self-pay | Admitting: Dermatology

## 2021-07-25 ENCOUNTER — Other Ambulatory Visit: Payer: Self-pay

## 2021-07-25 DIAGNOSIS — D485 Neoplasm of uncertain behavior of skin: Secondary | ICD-10-CM | POA: Diagnosis not present

## 2021-07-25 NOTE — Patient Instructions (Signed)

## 2021-07-26 NOTE — Progress Notes (Signed)
Rachel Juarez Phone: [phone] Subjective:    I'm seeing this patient by the request  of:  Rita Ohara, MD  CC: Left ankle injury follow-up  [id number]  06/29/2021 Patient had a fracture previously.  Still having some difficulty with ambulation and encouraged her to try to push with her regular walking or we will need to consider the possibility of formal physical therapy.  Patient wants to avoid formal physical therapy with which she had to go through with the contralateral side.  Patient will increase activity slowly.  Follow-up with me again in 4 weeks   Patient did have some mild soft tissue swelling still noted so we will do another course of doxycycline with findings noted possible cellulitis. Update 07/27/2021  Rachel Juarez is a 65 y.o. female coming in with complaint of L ankle pain. Patient states her left ankle is feeling very achy and swells on a regular basis.  Patient did take the doxycycline.  Feels like it helped a little bit but continues to have warmness to the ankle.  Patient has been doing the exercises occasionally.  Still has to wear shoes.  Cannot do any activity more than regular daily activities without significant discomfort and pain.      Past Medical History:  Diagnosis Date   Allergy    Anxiety    Depression, major, in remission ([place]) 2006   when daughter dx'd with autoimmune disorder   Family history of breast cancer    Family history of cancer tonsil    Family history of thyroid cancer    Generalized anxiety disorder    generalized, and related to mammograms and flying   Glaucoma suspect    sees Dr. Katy Fitch, has all been okay, sees him qoyr   Herpes labialis    Hypercholesterolemia    high HDL, borderline LDL   Hypothyroidism    treated by Dr. Chalmers Cater   Multinodular goiter    followed by Dr. Chalmers Cater; u/s q2 yr   PPD positive 1988   treated x 6 mos of INH   Seasonal  allergies    takes singulair year-round; worse in the Fall   Past Surgical History:  Procedure Laterality Date   CATARACT EXTRACTION, BILATERAL Bilateral June/July 2021   Dr. Gillian Scarce   Social History   Socioeconomic History   Marital status: Married    Spouse name: Not on file   Number of children: Not on file   Years of education: Not on file   Highest education level: Not on file  Occupational History   Occupation: yoga teacher  Tobacco Use   Smoking status: Never   Smokeless tobacco: Never  Vaping Use   Vaping Use: Never used  Substance and Sexual Activity   Alcohol use: Yes    Alcohol/week: 0.0 standard drinks    Comment: 2 Vodka sodas/day   Drug use: No   Sexual activity: Yes    Partners: Male    Birth control/protection: Post-menopausal  Other Topics Concern   Not on file  Social History Narrative   Pre-COVID, taught yoga at multiple places.    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  Engaged to be married. (comes home for her IVIG treatments still)   1 dog (Malti-poo)   Volunteers at the [employer]   Social Determinants of Radio broadcast assistant Strain: Not on file  Food Insecurity: Not on file  Transportation Needs: Not on file  Physical Activity: Not on file  Stress: Not on file  Social Connections: Not on file   Allergies  Allergen Reactions   Demerol [Meperidine] Other (See Comments)    Low bp, passes out   Family History  Problem Relation Age of Onset   Lupus Mother    Cancer Father 65       thymic cancer   COPD Father    Heart disease Father        CHF   Asthma Father    Cancer Sister    Breast cancer Sister 58       "best kind", lumpectomy and radiation   Dermatomyositis Daughter 42       juvenile form   Irritable bowel syndrome Daughter    Colon cancer Maternal Grandmother         80's   Cancer Maternal Grandmother    Hypertension Paternal Grandmother    Hyperlipidemia Paternal Grandmother    Thyroid disease Paternal Grandmother        had goiter removed   Stroke Paternal Grandmother 67   Hypertension Paternal Grandfather    Hyperlipidemia Paternal Grandfather    Heart disease Paternal Grandfather        MI first in his 2's   Stroke Paternal Grandfather    Cancer Paternal Uncle        started on tonsils (smoker)   Breast cancer Paternal Aunt 62   Breast cancer Cousin 38   Breast cancer Cousin 106       (the mother of the 11 y/o cousin with br ca   Thyroid cancer Cousin    Diabetes Neg Hx    Ovarian cancer Neg Hx     Current Outpatient Medications (Endocrine & Metabolic):    levothyroxine (SYNTHROID, LEVOTHROID) 75 MCG tablet, Take 75 mcg by mouth daily before breakfast.  Current Outpatient Medications (Cardiovascular):    rosuvastatin (CRESTOR) 10 MG tablet, Take 1 tablet (10 mg total) by mouth daily.  Current Outpatient Medications (Respiratory):    fexofenadine (ALLEGRA) 180 MG tablet, Take 180 mg by mouth daily.   montelukast (SINGULAIR) 10 MG tablet, Take 1 tablet (10 mg total) by mouth at bedtime.   Triamcinolone Acetonide (NASACORT AQ NA), Place 2 sprays into the nose daily.    Current Outpatient Medications (Other):    ALPRAZolam (XANAX) 0.5 MG tablet, Take 0.5-1 tablets (0.25-0.5 mg total) by mouth 3 (three) times daily as needed for anxiety or sleep.   buPROPion (WELLBUTRIN XL) 150 MG 24 hr tablet, Take 1 tablet (150 mg total) by mouth daily.   busPIRone (BUSPAR) 15 MG tablet, Take 1 tablet (15 mg total) by mouth 2 (two) times daily.   Calcium 250 MG CAPS, Take 2 capsules by mouth daily.   citalopram (CELEXA) 40 MG tablet, Take 1 tablet (40 mg total) by mouth daily.   doxycycline (VIBRA-TABS) 100 MG tablet, Take 1 tablet (100 mg total) by mouth 2 (two) times daily.   hydrocortisone 2.5 % ointment, Apply topically 2 (two) times daily.    Magnesium 500 MG TABS, Take 1 tablet by mouth daily.   MILK THISTLE PO, Take 1 capsule by mouth daily.   Misc Natural Products (TART CHERRY ADVANCED PO), Take 1 capsule by mouth daily.   Multiple Vitamins-Minerals (MULTIVITAMIN WITH MINERALS) tablet, Take 1 tablet by mouth daily.  mupirocin ointment (BACTROBAN) 2 %, Apply 1 application topically 2 (two) times daily.   NON FORMULARY, Take 2 capsules by mouth daily.   NON FORMULARY, Take 2 capsules by mouth daily.   Omega-3 Fatty Acids (FISH OIL) 1000 MG CPDR, Take 2,000 mg by mouth daily.   tamoxifen (NOLVADEX) 20 MG tablet, Take 1 tablet (20 mg total) by mouth daily.   valACYclovir (VALTREX) 1000 MG tablet, TAKE 2 TABLETS AT ONSET OF COLD SORE. REPEAT ONCE IN 12 HOURS (4 PILLS/COURSE)   vitamin C (ASCORBIC ACID) 500 MG tablet, Take 500 mg by mouth daily.   Vitamin D, Ergocalciferol, (DRISDOL) 1.25 MG (50000 UNIT) CAPS capsule, Take 1 capsule (50,000 Units total) by mouth every 7 (seven) days.   Reviewed prior external information including notes and imaging from  primary care provider As well as notes that were available from care everywhere and other healthcare systems.  Past medical history, social, surgical and family history all reviewed in electronic medical record.  No pertanent information unless stated regarding to the chief complaint.   Review of Systems:  No headache, visual changes, nausea, vomiting, diarrhea, constipation, dizziness, abdominal pain, skin rash, fevers, chills, night sweats, weight loss, swollen lymph nodes, body aches, joint swelling, chest pain, shortness of breath, mood changes. POSITIVE muscle aches  Objective  Blood pressure 115/75, pulse 77, height '5\' 6"'$  (1.676 m), weight 170 lb (77.1 kg), last menstrual period 11/18/2008, SpO2 98 %.   General: No apparent distress alert and oriented x3 mood and affect normal, dressed appropriately.  HEENT: Pupils equal, extraocular movements intact  Respiratory:  Patient's speak in full sentences and does not appear short of breath  Cardiovascular: No lower extremity edema, non tender, no erythema  Gait mild antalgic  MSK: Left ankle exam does still have some mild swelling on the lateral aspect of the ankle.  Still tender in that area.  Patient's abrasion seems to be well-healing but does have some warmness noted.  Limited muscular skeletal ultrasound was performed and interpreted by Hulan Saas, M  Patient's left ankle still on ultrasound has a cortical irregularity of the lateral malleolus mostly distal.  Patient has some mild underlying arthritic changes of the ankle joint at the mortise.  Patient does have soft tissue hypoechoic and hyperechoic changes still noted with increasing in neovascularization in Doppler flow.  Soft tissue swelling noted at the likely scar tissue though from previous injury.    Impression and Recommendations:     The above documentation has been reviewed and is accurate and complete Lyndal Pulley, DO

## 2021-07-27 ENCOUNTER — Ambulatory Visit (INDEPENDENT_AMBULATORY_CARE_PROVIDER_SITE_OTHER): Payer: 59 | Admitting: Family Medicine

## 2021-07-27 ENCOUNTER — Encounter: Payer: Self-pay | Admitting: Hematology and Oncology

## 2021-07-27 ENCOUNTER — Ambulatory Visit: Payer: Self-pay

## 2021-07-27 ENCOUNTER — Other Ambulatory Visit: Payer: Self-pay

## 2021-07-27 VITALS — BP 115/75 | HR 77 | Ht 66.0 in | Wt 170.0 lb

## 2021-07-27 DIAGNOSIS — S8265XA Nondisplaced fracture of lateral malleolus of left fibula, initial encounter for closed fracture: Secondary | ICD-10-CM

## 2021-07-27 NOTE — Assessment & Plan Note (Signed)
Patient on ultrasound still continues to have significant amount of swelling.  Still has some pain over the lateral malleolus.  Patient on x-rays only showed a mild avulsion.  I do not see any significant swelling in the joint itself but patient continues to have redness and inflammation and is unable to do more than daily activities at this time.  Has failed everything else including therapy, CAM Walker and I do feel advanced imaging is warranted.  Need to rule out an occult infection, for fracture.  Depending on findings we will discuss further medical management.

## 2021-07-27 NOTE — Patient Instructions (Signed)
[facility] [phone] Call Today  When we receive your results we will contact you.  

## 2021-08-03 ENCOUNTER — Encounter: Payer: Self-pay | Admitting: Dermatology

## 2021-08-03 NOTE — Progress Notes (Signed)
   Follow-Up Visit   Subjective  Rachel Juarez is a 65 y.o. female who presents for the following: Skin Problem (Growths on left arm, left leg and right leg).  Several new growths and skin check Location:  Duration:  Quality:  Associated Signs/Symptoms: Modifying Factors:  Severity:  Timing: Context:   Objective  Well appearing patient in no apparent distress; mood and affect are within normal limits. Right Forearm - Anterior Subtle waxy 5 mm papule, rule out superficial carcinoma     Left Upper Arm - Posterior Pink 6 mm crust, favor I SK over carcinoma     Left Thigh - Posterior Bi-chromic 6 mm crust       All skin waist up examined.  Areas beneath undergarments not fully examined   Assessment & Plan    Neoplasm of uncertain behavior of skin (3) Right Forearm - Anterior  Skin / nail biopsy Type of biopsy: tangential   Informed consent: discussed and consent obtained   Timeout: patient name, date of birth, surgical site, and procedure verified   Anesthesia: the lesion was anesthetized in a standard fashion   Anesthetic:  1% lidocaine w/ epinephrine 1-100,000 local infiltration Instrument used: flexible razor blade   Hemostasis achieved with: aluminum chloride and electrodesiccation   Outcome: patient tolerated procedure well   Post-procedure details: wound care instructions given    Specimen 1 - Surgical pathology Differential Diagnosis: seb k  Check Margins: No  Left Upper Arm - Posterior  Skin / nail biopsy Type of biopsy: tangential   Informed consent: discussed and consent obtained   Timeout: patient name, date of birth, surgical site, and procedure verified   Anesthesia: the lesion was anesthetized in a standard fashion   Anesthetic:  1% lidocaine w/ epinephrine 1-100,000 local infiltration Instrument used: flexible razor blade   Hemostasis achieved with: aluminum chloride and electrodesiccation   Outcome: patient tolerated procedure well    Post-procedure details: wound care instructions given    Specimen 2 - Surgical pathology Differential Diagnosis: seb k   Check Margins: No  Left Thigh - Posterior  Skin / nail biopsy Type of biopsy: tangential   Informed consent: discussed and consent obtained   Timeout: patient name, date of birth, surgical site, and procedure verified   Anesthesia: the lesion was anesthetized in a standard fashion   Anesthetic:  1% lidocaine w/ epinephrine 1-100,000 local infiltration Instrument used: flexible razor blade   Hemostasis achieved with: aluminum chloride and electrodesiccation   Outcome: patient tolerated procedure well   Post-procedure details: wound care instructions given    Specimen 3 - Surgical pathology Differential Diagnosis: seb k   Check Margins: No      I, Lavonna Monarch, MD, have reviewed all documentation for this visit.  The documentation on 08/03/21 for the exam, diagnosis, procedures, and orders are all accurate and complete.

## 2021-08-08 ENCOUNTER — Encounter: Payer: Self-pay | Admitting: Family Medicine

## 2021-08-09 ENCOUNTER — Other Ambulatory Visit: Payer: Self-pay | Admitting: Hematology and Oncology

## 2021-08-09 ENCOUNTER — Other Ambulatory Visit: Payer: 59

## 2021-08-09 DIAGNOSIS — Z803 Family history of malignant neoplasm of breast: Secondary | ICD-10-CM

## 2021-08-09 DIAGNOSIS — Z9189 Other specified personal risk factors, not elsewhere classified: Secondary | ICD-10-CM

## 2021-08-14 ENCOUNTER — Ambulatory Visit: Payer: 59 | Admitting: Hematology and Oncology

## 2021-08-14 ENCOUNTER — Other Ambulatory Visit: Payer: Self-pay

## 2021-08-14 ENCOUNTER — Ambulatory Visit
Admission: RE | Admit: 2021-08-14 | Discharge: 2021-08-14 | Disposition: A | Discharge status: Home / Self Care | Payer: 59 | Source: Ambulatory Visit | Attending: Family Medicine | Admitting: Family Medicine

## 2021-08-14 DIAGNOSIS — S8265XA Nondisplaced fracture of lateral malleolus of left fibula, initial encounter for closed fracture: Secondary | ICD-10-CM

## 2021-08-15 ENCOUNTER — Ambulatory Visit (HOSPITAL_COMMUNITY): Payer: 59

## 2021-08-16 ENCOUNTER — Encounter: Payer: Self-pay | Admitting: Family Medicine

## 2021-08-20 NOTE — Progress Notes (Signed)
[place] Phone: [phone] Subjective:   Rachel Juarez, am serving as a scribe for Dr. Hulan Saas.  This visit occurred during the SARS-CoV-2 public health emergency.  Safety protocols were in place, including screening questions prior to the visit, additional usage of staff PPE, and extensive cleaning of exam room while observing appropriate contact time as indicated for disinfecting solutions.   I'm seeing this patient by the request  of:  Rita Ohara, MD  CC: Ankle pain follow-up  OIN:[id number]  07/27/2021 Patient on ultrasound still continues to have significant amount of swelling.  Still has some pain over the lateral malleolus.  Patient on x-rays only showed a mild avulsion.  I do not see any significant swelling in the joint itself but patient continues to have redness and inflammation and is unable to do more than daily activities at this time.  Has failed everything else including therapy, CAM Walker and I do feel advanced imaging is warranted.  Need to rule out an occult infection, for fracture.  Depending on findings we will discuss further medical management.  Updated 08/21/2021 Rachel Juarez is a 65 y.o. female coming in with complaint of left ankle pain. PRP injection wanted.  Patient is noticing more and still the discomfort noted over the peroneal tendons than anywhere else.  MRI 08/14/2021 IMPRESSION: 1. Tear of the anterior talofibular ligament with associated marrow edema at the talar attachment. Possible additional small avulsion fractures as correlated with prior radiographs. 2. The deltoid ligament also appears edematous and ill-defined, possibly strained. 3. Longitudinal split tear of the peroneus brevis tendon with mild peroneus longus tendinosis. The additional ankle tendons appear normal. 4. Small ankle joint effusion without evidence of talar dome osteochondral injury.      Past Medical History:  Diagnosis Date   Allergy    Anxiety    Depression, major, in remission ([place]) 2006   when daughter dx'd with autoimmune disorder   Family history of breast cancer    Family history of cancer tonsil    Family history of thyroid cancer    Generalized anxiety disorder    generalized, and related to mammograms and flying   Glaucoma suspect    sees Dr. Katy Fitch, has all been okay, sees him qoyr   Herpes labialis    Hypercholesterolemia    high HDL, borderline LDL   Hypothyroidism    treated by Dr. Chalmers Cater   Multinodular goiter    followed by Dr. 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 Gillian Scarce   Social History   Socioeconomic History   Marital status: Married    Spouse name: Not on file   Number of children: Not on file   Years of education: Not on file   Highest education level: Not on file  Occupational History   Occupation: yoga teacher  Tobacco Use   Smoking status: Never   Smokeless tobacco: Never  Vaping Use   Vaping Use: Never used  Substance and Sexual Activity   Alcohol use: Yes    Alcohol/week: 0.0 standard drinks    Comment: 2 Vodka sodas/day   Drug use: Juarez   Sexual activity: Yes    Partners: Male  Birth control/protection: Post-menopausal  Other Topics Concern   Not on file  Social History Narrative   Pre-COVID, taught yoga at multiple places.    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  Engaged to be married. (comes home for her IVIG treatments still)   1 dog (Malti-poo)   Volunteers at the [place] Strain: Not on file   Food Insecurity: Not on file  Transportation Needs: Not on file  Physical Activity: Not on file  Stress: Not on file  Social Connections: Not on file   Allergies  Allergen Reactions   Demerol [Meperidine] Other (See Comments)    Low bp, passes out   Family History  Problem Relation Age of Onset   Lupus Mother    Cancer Father 55       thymic cancer   COPD Father    Heart disease Father        CHF   Asthma Father    Cancer Sister    Breast cancer Sister 64       "best kind", lumpectomy and radiation   Dermatomyositis Daughter 35       juvenile form   Irritable bowel syndrome Daughter    Colon cancer Maternal Grandmother        80's   Cancer Maternal Grandmother    Hypertension Paternal Grandmother    Hyperlipidemia Paternal Grandmother    Thyroid disease Paternal Grandmother        had goiter removed   Stroke Paternal Grandmother 58   Hypertension Paternal Grandfather    Hyperlipidemia Paternal Grandfather    Heart disease Paternal Grandfather        MI first in his 36's   Stroke Paternal Grandfather    Cancer Paternal Uncle        started on tonsils (smoker)   Breast cancer Paternal Aunt 29   Breast cancer Cousin 5   Breast cancer Cousin 84       (the mother of the 30 y/o cousin with br ca   Thyroid cancer Cousin    Diabetes Neg Hx    Ovarian cancer Neg Hx     Current Outpatient Medications (Endocrine & Metabolic):    levothyroxine (SYNTHROID, LEVOTHROID) 75 MCG tablet, Take 75 mcg by mouth daily before breakfast.  Current Outpatient Medications (Cardiovascular):    rosuvastatin (CRESTOR) 10 MG tablet, Take 1 tablet (10 mg total) by mouth daily.  Current Outpatient Medications (Respiratory):    fexofenadine (ALLEGRA) 180 MG tablet, Take 180 mg by mouth daily.   montelukast (SINGULAIR) 10 MG tablet, Take 1 tablet (10 mg total) by mouth at bedtime.   Triamcinolone Acetonide (NASACORT AQ NA), Place 2 sprays into the nose daily.    Current Outpatient  Medications (Other):    ALPRAZolam (XANAX) 0.5 MG tablet, Take 0.5-1 tablets (0.25-0.5 mg total) by mouth 3 (three) times daily as needed for anxiety or sleep.   buPROPion (WELLBUTRIN XL) 150 MG 24 hr tablet, Take 1 tablet (150 mg total) by mouth daily.   busPIRone (BUSPAR) 15 MG tablet, Take 1 tablet (15 mg total) by mouth 2 (two) times daily.   Calcium 250 MG CAPS, Take 2 capsules by mouth daily.   citalopram (CELEXA) 40 MG tablet, Take 1 tablet (40 mg total) by mouth daily.  doxycycline (VIBRA-TABS) 100 MG tablet, Take 1 tablet (100 mg total) by mouth 2 (two) times daily.   hydrocortisone 2.5 % ointment, Apply topically 2 (two) times daily.   Magnesium 500 MG TABS, Take 1 tablet by mouth daily.   MILK THISTLE PO, Take 1 capsule by mouth daily.   Misc Natural Products (TART CHERRY ADVANCED PO), Take 1 capsule by mouth daily.   Multiple Vitamins-Minerals (MULTIVITAMIN WITH MINERALS) tablet, Take 1 tablet by mouth daily.   mupirocin ointment (BACTROBAN) 2 %, Apply 1 application topically 2 (two) times daily.   NON FORMULARY, Take 2 capsules by mouth daily.   NON FORMULARY, Take 2 capsules by mouth daily.   Omega-3 Fatty Acids (FISH OIL) 1000 MG CPDR, Take 2,000 mg by mouth daily.   tamoxifen (NOLVADEX) 20 MG tablet, Take 1 tablet (20 mg total) by mouth daily.   valACYclovir (VALTREX) 1000 MG tablet, TAKE 2 TABLETS AT ONSET OF COLD SORE. REPEAT ONCE IN 12 HOURS (4 PILLS/COURSE)   vitamin C (ASCORBIC ACID) 500 MG tablet, Take 500 mg by mouth daily.   Vitamin D, Ergocalciferol, (DRISDOL) 1.25 MG (50000 UNIT) CAPS capsule, Take 1 capsule (50,000 Units total) by mouth every 7 (seven) days.   Objective  Blood pressure 118/82, pulse 78, height 5\' 6"  (1.676 m), weight 171 lb (77.6 kg), last menstrual period 11/18/2008, SpO2 98 %.   General: Juarez apparent distress alert and oriented x3 mood and affect normal, dressed appropriately.    Procedure: Real-time Ultrasound Guided Injection of left peroneal  tendon sheath Device: GE Logiq Q7 Ultrasound guided injection is preferred based studies that show increased duration, increased effect, greater accuracy, decreased procedural pain, increased response rate, and decreased cost with ultrasound guided versus blind injection.  Verbal informed consent obtained.  Time-out conducted.  Noted Juarez overlying erythema, induration, or other signs of local infection.  Skin prepped in a sterile fashion.  Local anesthesia: Topical Ethyl chloride.  With sterile technique and under real time ultrasound guidance: With a 21-gauge 2 inch needle patient was injected just inferior to the lateral malleolus and the peroneal tendon sheath.  A total of 0.5 cc of 0.5% Marcaine used.  Then injected with 3 cc of  precentrifuge PRP. Completed without difficulty  Pain immediately resolved suggesting accurate placement of the medication.  Advised to call if fevers/chills, erythema, induration, drainage, or persistent bleeding.  Impression: Technically successful ultrasound guided injection.   Impression and Recommendations:    The above documentation has been reviewed and is accurate and complete Lyndal Pulley, DO

## 2021-08-21 ENCOUNTER — Ambulatory Visit: Payer: Self-pay

## 2021-08-21 ENCOUNTER — Ambulatory Visit: Payer: Self-pay | Admitting: Family Medicine

## 2021-08-21 ENCOUNTER — Ambulatory Visit (HOSPITAL_COMMUNITY): Payer: 59

## 2021-08-21 ENCOUNTER — Other Ambulatory Visit: Payer: Self-pay

## 2021-08-21 VITALS — BP 118/82 | HR 78 | Ht 66.0 in | Wt 171.0 lb

## 2021-08-21 DIAGNOSIS — G8929 Other chronic pain: Secondary | ICD-10-CM

## 2021-08-21 DIAGNOSIS — S86312A Strain of muscle(s) and tendon(s) of peroneal muscle group at lower leg level, left leg, initial encounter: Secondary | ICD-10-CM

## 2021-08-21 DIAGNOSIS — M25572 Pain in left ankle and joints of left foot: Secondary | ICD-10-CM

## 2021-08-21 DIAGNOSIS — S86319A Strain of muscle(s) and tendon(s) of peroneal muscle group at lower leg level, unspecified leg, initial encounter: Secondary | ICD-10-CM | POA: Insufficient documentation

## 2021-08-21 NOTE — Patient Instructions (Signed)
No ice or IBU for 3 days See me in 4 weeks

## 2021-08-21 NOTE — Assessment & Plan Note (Signed)
Injection given today and tolerated the procedure well.  Discussed icing regimen and home exercises.  Discussed avoiding the ice though initially and given a post PRP injection protocol.  Follow-up again in 6 weeks.  Will call if any significant redness or any signs of any infectious etiology.

## 2021-08-24 ENCOUNTER — Encounter: Payer: Self-pay | Admitting: Hematology and Oncology

## 2021-08-24 ENCOUNTER — Other Ambulatory Visit: Payer: Self-pay | Admitting: Family Medicine

## 2021-08-24 DIAGNOSIS — F419 Anxiety disorder, unspecified: Secondary | ICD-10-CM

## 2021-08-25 ENCOUNTER — Other Ambulatory Visit: Payer: Self-pay

## 2021-08-25 ENCOUNTER — Ambulatory Visit
Admission: RE | Admit: 2021-08-25 | Discharge: 2021-08-25 | Disposition: A | Discharge status: Home / Self Care | Payer: 59 | Source: Ambulatory Visit | Attending: Hematology and Oncology | Admitting: Hematology and Oncology

## 2021-08-25 DIAGNOSIS — Z803 Family history of malignant neoplasm of breast: Secondary | ICD-10-CM

## 2021-08-25 DIAGNOSIS — Z9189 Other specified personal risk factors, not elsewhere classified: Secondary | ICD-10-CM

## 2021-08-25 MED ORDER — GADOBUTROL 1 MMOL/ML IV SOLN
8.0000 mL | Freq: Once | INTRAVENOUS | Status: AC | PRN
  Administered 2021-08-25: 8 mL via INTRAVENOUS

## 2021-08-28 ENCOUNTER — Encounter: Payer: Self-pay | Admitting: Family Medicine

## 2021-08-29 ENCOUNTER — Other Ambulatory Visit: Payer: Self-pay

## 2021-08-29 ENCOUNTER — Other Ambulatory Visit (INDEPENDENT_AMBULATORY_CARE_PROVIDER_SITE_OTHER): Payer: 59

## 2021-08-29 DIAGNOSIS — Z23 Encounter for immunization: Secondary | ICD-10-CM

## 2021-09-05 ENCOUNTER — Encounter: Payer: Self-pay | Admitting: Family Medicine

## 2021-09-17 NOTE — Progress Notes (Signed)
Rachel Juarez [address] [place] Phone: [phone] Subjective:   Rachel Juarez, am serving as a scribe for Dr. Hulan Saas. This visit occurred during the SARS-CoV-2 public health emergency.  Safety protocols were in place, including screening questions prior to the visit, additional usage of staff PPE, and extensive cleaning of exam room while observing appropriate contact time as indicated for disinfecting solutions.   I'm seeing this patient by the request  of:  Rita Ohara, MD  CC: Left ankle pain follow-up  ENI:[id number]  08/21/2021 Injection given today and tolerated the procedure well.  Discussed icing regimen and home exercises.  Discussed avoiding the ice though initially and given a post PRP injection protocol.  Follow-up again in 6 weeks.  Will call if any significant redness or any signs of any infectious etiology.  Update 09/18/2021 Rachel Juarez is a 65 y.o. female coming in with complaint of L ankle pain. Patient states feels moderately better. More stiffness than pain. In shoes feels okay, barefoot is when she can feel it the most. Will swell depending on what has been done that day Patient was seen previously and did have a peroneal tear noted on the MRI.      Past Medical History:  Diagnosis Date   Allergy    Anxiety    Depression, major, in remission ([place]) 2006   when daughter dx'd with autoimmune disorder   Family history of breast cancer    Family history of cancer tonsil    Family history of thyroid cancer    Generalized anxiety disorder    generalized, and related to mammograms and flying   Glaucoma suspect    sees Dr. Katy Fitch, has all been okay, sees him qoyr   Herpes labialis    Hypercholesterolemia    high HDL, borderline LDL   Hypothyroidism    treated by Dr. Chalmers Cater   Multinodular goiter    followed by Dr. Chalmers Cater; u/s q2 yr   PPD positive 1988   treated x 6 mos of INH   Seasonal allergies     takes singulair year-round; worse in the Fall   Past Surgical History:  Procedure Laterality Date   CATARACT EXTRACTION, BILATERAL Bilateral June/July 2021   Dr. Gillian Scarce   Social History   Socioeconomic History   Marital status: Married    Spouse name: Not on file   Number of children: Not on file   Years of education: Not on file   Highest education level: Not on file  Occupational History   Occupation: yoga teacher  Tobacco Use   Smoking status: Never   Smokeless tobacco: Never  Vaping Use   Vaping Use: Never used  Substance and Sexual Activity   Alcohol use: Yes    Alcohol/week: 0.0 standard drinks    Comment: 2 Vodka sodas/day   Drug use: No   Sexual activity: Yes    Partners: Male    Birth control/protection: Post-menopausal  Other Topics Concern   Not on file  Social History Narrative   Pre-COVID, taught yoga at multiple places.    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  Engaged to be married. (comes home for her IVIG treatments still)   1 dog (Malti-poo)   Volunteers at the [employer]  [employer] Strain: Not on file  Food Insecurity: Not on file  Transportation Needs: Not on file  Physical Activity: Not on file  Stress: Not on file  Social Connections: Not on file   Allergies  Allergen Reactions   Demerol [Meperidine] Other (See Comments)    Low bp, passes out   Family History  Problem Relation Age of Onset   Lupus Mother    Cancer Father 18       thymic cancer   COPD Father    Heart disease Father        CHF   Asthma Father    Cancer Sister    Breast cancer Sister 35       "best kind", lumpectomy and radiation   Dermatomyositis Daughter 17       juvenile form   Irritable bowel syndrome Daughter    Colon cancer Maternal Grandmother        80's    Cancer Maternal Grandmother    Hypertension Paternal Grandmother    Hyperlipidemia Paternal Grandmother    Thyroid disease Paternal Grandmother        had goiter removed   Stroke Paternal Grandmother 28   Hypertension Paternal Grandfather    Hyperlipidemia Paternal Grandfather    Heart disease Paternal Grandfather        MI first in his 14's   Stroke Paternal Grandfather    Cancer Paternal Uncle        started on tonsils (smoker)   Breast cancer Paternal Aunt 55   Breast cancer Cousin 70   Breast cancer Cousin 17       (the mother of the 38 y/o cousin with br ca   Thyroid cancer Cousin    Diabetes Neg Hx    Ovarian cancer Neg Hx     Current Outpatient Medications (Endocrine & Metabolic):    levothyroxine (SYNTHROID, LEVOTHROID) 75 MCG tablet, Take 75 mcg by mouth daily before breakfast.  Current Outpatient Medications (Cardiovascular):    rosuvastatin (CRESTOR) 10 MG tablet, Take 1 tablet (10 mg total) by mouth daily.  Current Outpatient Medications (Respiratory):    fexofenadine (ALLEGRA) 180 MG tablet, Take 180 mg by mouth daily.   montelukast (SINGULAIR) 10 MG tablet, Take 1 tablet (10 mg total) by mouth at bedtime.   Triamcinolone Acetonide (NASACORT AQ NA), Place 2 sprays into the nose daily.    Current Outpatient Medications (Other):    ALPRAZolam (XANAX) 0.5 MG tablet, TAKE 0.5-1 TABLETS (0.25-0.5 MG TOTAL) BY MOUTH 3 (THREE) TIMES DAILY AS NEEDED FOR ANXIETY OR SLEEP.   buPROPion (WELLBUTRIN XL) 150 MG 24 hr tablet, Take 1 tablet (150 mg total) by mouth daily.   busPIRone (BUSPAR) 15 MG tablet, Take 1 tablet (15 mg total) by mouth 2 (two) times daily.   Calcium 250 MG CAPS, Take 2 capsules by mouth daily.   citalopram (CELEXA) 40 MG tablet, Take 1 tablet (40 mg total) by mouth daily.   doxycycline (VIBRA-TABS) 100 MG tablet, Take 1 tablet (100 mg total) by mouth 2 (two) times daily.   hydrocortisone 2.5 % ointment, Apply topically 2 (two) times daily.   Magnesium  500 MG TABS, Take 1 tablet by mouth daily.   MILK THISTLE PO, Take 1 capsule by mouth daily.   Misc Natural Products (TART CHERRY ADVANCED PO), Take 1 capsule by mouth daily.   Multiple Vitamins-Minerals (MULTIVITAMIN WITH MINERALS) tablet, Take 1 tablet by mouth daily.   mupirocin ointment (BACTROBAN) 2 %, Apply 1 application  topically 2 (two) times daily.   NON FORMULARY, Take 2 capsules by mouth daily.   NON FORMULARY, Take 2 capsules by mouth daily.   Omega-3 Fatty Acids (FISH OIL) 1000 MG CPDR, Take 2,000 mg by mouth daily.   tamoxifen (NOLVADEX) 20 MG tablet, Take 1 tablet (20 mg total) by mouth daily.   valACYclovir (VALTREX) 1000 MG tablet, TAKE 2 TABLETS AT ONSET OF COLD SORE. REPEAT ONCE IN 12 HOURS (4 PILLS/COURSE)   vitamin C (ASCORBIC ACID) 500 MG tablet, Take 500 mg by mouth daily.   Vitamin D, Ergocalciferol, (DRISDOL) 1.25 MG (50000 UNIT) CAPS capsule, Take 1 capsule (50,000 Units total) by mouth every 7 (seven) days.   Reviewed prior external information including notes and imaging from  primary care provider As well as notes that were available from care everywhere and other healthcare systems.  Past medical history, social, surgical and family history all reviewed in electronic medical record.  No pertanent information unless stated regarding to the chief complaint.   Review of Systems:  No headache, visual changes, nausea, vomiting, diarrhea, constipation, dizziness, abdominal pain, skin rash, fevers, chills, night sweats, weight loss, swollen lymph nodes, body aches, joint swelling, chest pain, shortness of breath, mood changes. POSITIVE muscle aches  Objective  Blood pressure (!) 102/58, pulse 85, height 5\' 6"  (1.676 m), weight 170 lb (77.1 kg), last menstrual period 11/18/2008, SpO2 96 %.   General: No apparent distress alert and oriented x3 mood and affect normal, dressed appropriately.  HEENT: Pupils equal, extraocular movements intact  Respiratory: Patient's  speak in full sentences and does not appear short of breath  Cardiovascular: No lower extremity edema, non tender, no erythema  Gait normal with good balance and coordination.  MSK: Left ankle exam shows the patient still has some mild swelling of the ankle mortise on the anterior lateral aspect.  Patient was nontender over the peroneal tendons but does have more tightness noted.  Limited muscular skeletal ultrasound was performed and interpreted by Hulan Saas, M  Limited ultrasound of patient's ankle shows patient does have hypoechoic changes still noted more of the proximal peroneal tendon but where PRP was done at the insertion no significant hypoechoic changes. Still have likely rupture of ATFL and mild ankle effusion.  Impression: interval healing noted of tendon.     Impression and Recommendations:     The above documentation has been reviewed and is accurate and complete Lyndal Pulley, DO

## 2021-09-18 ENCOUNTER — Ambulatory Visit: Payer: 59 | Admitting: Family Medicine

## 2021-09-18 ENCOUNTER — Encounter: Payer: Self-pay | Admitting: Family Medicine

## 2021-09-18 ENCOUNTER — Ambulatory Visit: Payer: Self-pay

## 2021-09-18 ENCOUNTER — Other Ambulatory Visit: Payer: Self-pay

## 2021-09-18 VITALS — BP 102/58 | HR 85 | Ht 66.0 in | Wt 170.0 lb

## 2021-09-18 DIAGNOSIS — S8265XA Nondisplaced fracture of lateral malleolus of left fibula, initial encounter for closed fracture: Secondary | ICD-10-CM | POA: Diagnosis not present

## 2021-09-18 DIAGNOSIS — S86312D Strain of muscle(s) and tendon(s) of peroneal muscle group at lower leg level, left leg, subsequent encounter: Secondary | ICD-10-CM

## 2021-09-18 NOTE — Assessment & Plan Note (Signed)
PRP shows that the insertion patient is feeling tremendously well.  On ultrasound the patient does have some secondary hypoechoic changes just inferior to the malleolus.  Some increasing in Doppler flow noted in neovascularization.  We will continue to monitor this area but have patient advance accordingly.  If patient does better we will continue with advancing patient otherwise if worsening we can consider repeating PRP in this area.  Patient understands this and will follow up with me again in 3 months otherwise.

## 2021-09-18 NOTE — Patient Instructions (Signed)
No jumping but see how it does going to town [employer] If doing well great, if not then will consider PRP

## 2021-10-23 NOTE — Progress Notes (Signed)
[facility] [place] [address] [place] Phone: [phone] Subjective:   [name], am serving as a scribe for Dr. Hulan Saas. This visit occurred during the SARS-CoV-2 public health emergency.  Safety protocols were in place, including screening questions prior to the visit, additional usage of staff PPE, and extensive cleaning of exam room while observing appropriate contact time as indicated for disinfecting solutions.   I'm seeing this patient by the request  of:  Rita Ohara, MD  CC: Left ankle pain follow-up  EXH:[id number]  09/18/2021 PRP shows that the insertion patient is feeling tremendously well.  On ultrasound the patient does have some secondary hypoechoic changes just inferior to the malleolus.  Some increasing in Doppler flow noted in neovascularization.  We will continue to monitor this area but have patient advance accordingly.  If patient does better we will continue with advancing patient otherwise if worsening we can consider repeating PRP in this area.  Patient understands this and will follow up with me again in 3 months otherwise.  Updated 10/24/2021 Tiffany Calmes is a 65 y.o. female coming in with complaint of ankle pain. Patient states her ankle is bothering her. It's sore. Discomfort in that radiated into lower leg and then feels it in her knee. Rest helps with bringing down this discomfort, but its not enough pain to take medicine for.       Past Medical History:  Diagnosis Date   Allergy    Anxiety    Depression, major, in remission ([place]) 2006   when daughter dx'd with autoimmune disorder   Family history of breast cancer    Family history of cancer tonsil    Family history of thyroid cancer    Generalized anxiety disorder    generalized, and related to mammograms and flying   Glaucoma suspect    sees Dr. Katy Fitch, has all been okay, sees him qoyr   Herpes labialis    Hypercholesterolemia    high  HDL, borderline LDL   Hypothyroidism    treated by Dr. Chalmers Cater   Multinodular goiter    followed by Dr. 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 Gillian Scarce   Social History   Socioeconomic History   Marital status: Married    Spouse name: Not on file   Number of children: Not on file   Years of education: Not on file   Highest education level: Not on file  Occupational History   Occupation: yoga teacher  Tobacco Use   Smoking status: Never   Smokeless tobacco: Never  Vaping Use   Vaping Use: Never used  Substance and Sexual Activity   Alcohol use: Yes    Alcohol/week: 0.0 standard drinks    Comment: 2 Vodka sodas/day   Drug use: No   Sexual activity: Yes    Partners: Male    Birth control/protection: Post-menopausal  Other Topics Concern   Not on file  Social History Narrative   Pre-COVID, taught yoga at multiple places.    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  Engaged  to be married. (comes home for her IVIG treatments still)   1 dog (Malti-poo)   Volunteers at the [place] Strain: Not on file  Food Insecurity: Not on file  Transportation Needs: Not on file  Physical Activity: Not on file  Stress: Not on file  Social Connections: Not on file   Allergies  Allergen Reactions   Demerol [Meperidine] Other (See Comments)    Low bp, passes out   Family History  Problem Relation Age of Onset   Lupus Mother    Cancer Father 22       thymic cancer   COPD Father    Heart disease Father        CHF   Asthma Father    Cancer Sister    Breast cancer Sister  76       "best kind", lumpectomy and radiation   Dermatomyositis Daughter 30       juvenile form   Irritable bowel syndrome Daughter    Colon cancer Maternal Grandmother        80's   Cancer Maternal Grandmother    Hypertension Paternal Grandmother    Hyperlipidemia Paternal Grandmother    Thyroid disease Paternal Grandmother        had goiter removed   Stroke Paternal Grandmother 64   Hypertension Paternal Grandfather    Hyperlipidemia Paternal Grandfather    Heart disease Paternal Grandfather        MI first in his 49's   Stroke Paternal Grandfather    Cancer Paternal Uncle        started on tonsils (smoker)   Breast cancer Paternal Aunt 64   Breast cancer Cousin 70   Breast cancer Cousin 66       (the mother of the 39 y/o cousin with br ca   Thyroid cancer Cousin    Diabetes Neg Hx    Ovarian cancer Neg Hx     Current Outpatient Medications (Endocrine & Metabolic):    levothyroxine (SYNTHROID, LEVOTHROID) 75 MCG tablet, Take 75 mcg by mouth daily before breakfast.  Current Outpatient Medications (Cardiovascular):    rosuvastatin (CRESTOR) 10 MG tablet, Take 1 tablet (10 mg total) by mouth daily.  Current Outpatient Medications (Respiratory):    fexofenadine (ALLEGRA) 180 MG tablet, Take 180 mg by mouth daily.   montelukast (SINGULAIR) 10 MG tablet, Take 1 tablet (10 mg total) by mouth at bedtime.   Triamcinolone Acetonide (NASACORT AQ NA), Place 2 sprays into the nose daily.    Current Outpatient Medications (Other):    ALPRAZolam (XANAX) 0.5 MG tablet, TAKE 0.5-1 TABLETS (0.25-0.5 MG TOTAL) BY MOUTH 3 (THREE) TIMES DAILY AS NEEDED FOR ANXIETY OR SLEEP.   buPROPion (WELLBUTRIN XL) 150 MG 24 hr tablet, Take 1 tablet (150 mg total) by mouth daily.   busPIRone (BUSPAR) 15 MG tablet, Take 1 tablet (15 mg total) by mouth 2 (two) times daily.   Calcium 250 MG CAPS, Take 2 capsules by mouth daily.   citalopram (CELEXA) 40 MG tablet, Take 1 tablet (40 mg total) by mouth  daily.   doxycycline (VIBRA-TABS) 100 MG tablet, Take 1 tablet (100 mg total) by mouth 2 (two) times daily.   hydrocortisone 2.5 % ointment, Apply topically 2 (two) times daily.   Magnesium 500 MG TABS, Take 1 tablet by mouth daily.   MILK THISTLE PO, Take 1 capsule by mouth daily.   Misc Natural Products (TART CHERRY ADVANCED PO),  Take 1 capsule by mouth daily.   Multiple Vitamins-Minerals (MULTIVITAMIN WITH MINERALS) tablet, Take 1 tablet by mouth daily.   mupirocin ointment (BACTROBAN) 2 %, Apply 1 application topically 2 (two) times daily.   NON FORMULARY, Take 2 capsules by mouth daily.   NON FORMULARY, Take 2 capsules by mouth daily.   Omega-3 Fatty Acids (FISH OIL) 1000 MG CPDR, Take 2,000 mg by mouth daily.   tamoxifen (NOLVADEX) 20 MG tablet, Take 1 tablet (20 mg total) by mouth daily.   valACYclovir (VALTREX) 1000 MG tablet, TAKE 2 TABLETS AT ONSET OF COLD SORE. REPEAT ONCE IN 12 HOURS (4 PILLS/COURSE)   vitamin C (ASCORBIC ACID) 500 MG tablet, Take 500 mg by mouth daily.   Vitamin D, Ergocalciferol, (DRISDOL) 1.25 MG (50000 UNIT) CAPS capsule, Take 1 capsule (50,000 Units total) by mouth every 7 (seven) days.   Reviewed prior external information including notes and imaging from  primary care provider As well as notes that were available from care everywhere and other healthcare systems.  Past medical history, social, surgical and family history all reviewed in electronic medical record.  No pertanent information unless stated regarding to the chief complaint.   Review of Systems:  No headache, visual changes, nausea, vomiting, diarrhea, constipation, dizziness, abdominal pain, skin rash, fevers, chills, night sweats, weight loss, swollen lymph nodes, body aches, joint swelling, chest pain, shortness of breath, mood changes. POSITIVE muscle aches  Objective  Blood pressure 122/82, pulse 89, height 5\' 6"  (1.676 m), weight 170 lb (77.1 kg), last menstrual period 11/18/2008, SpO2  98 %.   General: No apparent distress alert and oriented x3 mood and affect normal, dressed appropriately.  HEENT: Pupils equal, extraocular movements intact  Respiratory: Patient's speak in full sentences and does not appear short of breath  Cardiovascular: No lower extremity edema, non tender, no erythema  Gait normal with good balance and coordination.  MSK: Left ankle exam does still have some mild swelling noted.  Scar tissue from patient's previous injury on the anterior lateral aspect of the ankle.  No tenderness over the peroneal tendon.  Patient does have fullness noted of the anterior ankle mortise.  Limited muscular skeletal ultrasound was performed and interpreted by Hulan Saas, M  Limited ultrasound of patient's ankle shows the patient still has a effusion noted.  Patient does have some posttraumatic arthritic changes noted of the ankle mortise.  Still has callus formation over the talar dome.  Patient's peroneal tendons are significantly improved still from previous exam with very mild increase in Doppler flow. Impression: Continued ankle effusion with mild arthritis but interval improvement of the peroneal tendons after PRP  Procedure: Real-time Ultrasound Guided Injection of left ankle joint Device: GE Logiq Q7 Ultrasound guided injection is preferred based studies that show increased duration, increased effect, greater accuracy, decreased procedural pain, increased response rate, and decreased cost with ultrasound guided versus blind injection.  Verbal informed consent obtained.  Time-out conducted.  Noted no overlying erythema, induration, or other signs of local infection.  Skin prepped in a sterile fashion.  Local anesthesia: Topical Ethyl chloride.  With sterile technique and under real time ultrasound guidance: With a 25-gauge 1 inch needle injected into the ankle joint from the anterior lateral aspect.  A total of 0.5 cc of 0.5% Marcaine and 0.5 cc of Kenalog 40 mg/mL  used. Completed without difficulty  Pain immediately resolved suggesting accurate placement of the medication.  Advised to call if fevers/chills, erythema, induration, drainage, or persistent bleeding.  Impression: Technically successful ultrasound guided injection.    Impression and Recommendations:     The above documentation has been reviewed and is accurate and complete Lyndal Pulley, DO

## 2021-10-24 ENCOUNTER — Other Ambulatory Visit: Payer: Self-pay

## 2021-10-24 ENCOUNTER — Ambulatory Visit (INDEPENDENT_AMBULATORY_CARE_PROVIDER_SITE_OTHER): Payer: 59 | Admitting: Family Medicine

## 2021-10-24 ENCOUNTER — Ambulatory Visit: Payer: Self-pay

## 2021-10-24 ENCOUNTER — Encounter: Payer: Self-pay | Admitting: Family Medicine

## 2021-10-24 VITALS — BP 122/82 | HR 89 | Ht 66.0 in | Wt 170.0 lb

## 2021-10-24 DIAGNOSIS — S8265XA Nondisplaced fracture of lateral malleolus of left fibula, initial encounter for closed fracture: Secondary | ICD-10-CM

## 2021-10-24 NOTE — Assessment & Plan Note (Signed)
Patient given injection today and tolerated the procedure well.  Patient continued to have effusion noted.  Discussed with patient that I do think that this will be more beneficial to try to decrease the inflammation and see how much she responds.  Could be a candidate for potential PRP of the ankle joint.  Discussed continuing the right age-related foot wear.  Worsening pain or any redness patient is to seek medical attention immediately.  Patient wants to avoid any type of surgical intervention whatsoever.

## 2021-10-24 NOTE — Patient Instructions (Addendum)
Injection today Keep doing everything else Send message in couple of weeks Ice is a good idea Swelling or redness call us See you again in 6 weeks PETER MUST DO ALL THE DECORATIONS!!

## 2021-10-27 IMAGING — DX DG ANKLE COMPLETE 3+V*L*
3 series · 3 of 3 positions shown · non-contrast
Comparison: None.

CLINICAL DATA: Fall 2 days ago with ankle pain and swelling,
initial encounter

EXAM:
LEFT ANKLE COMPLETE - 3+ VIEW

[ankle ap]
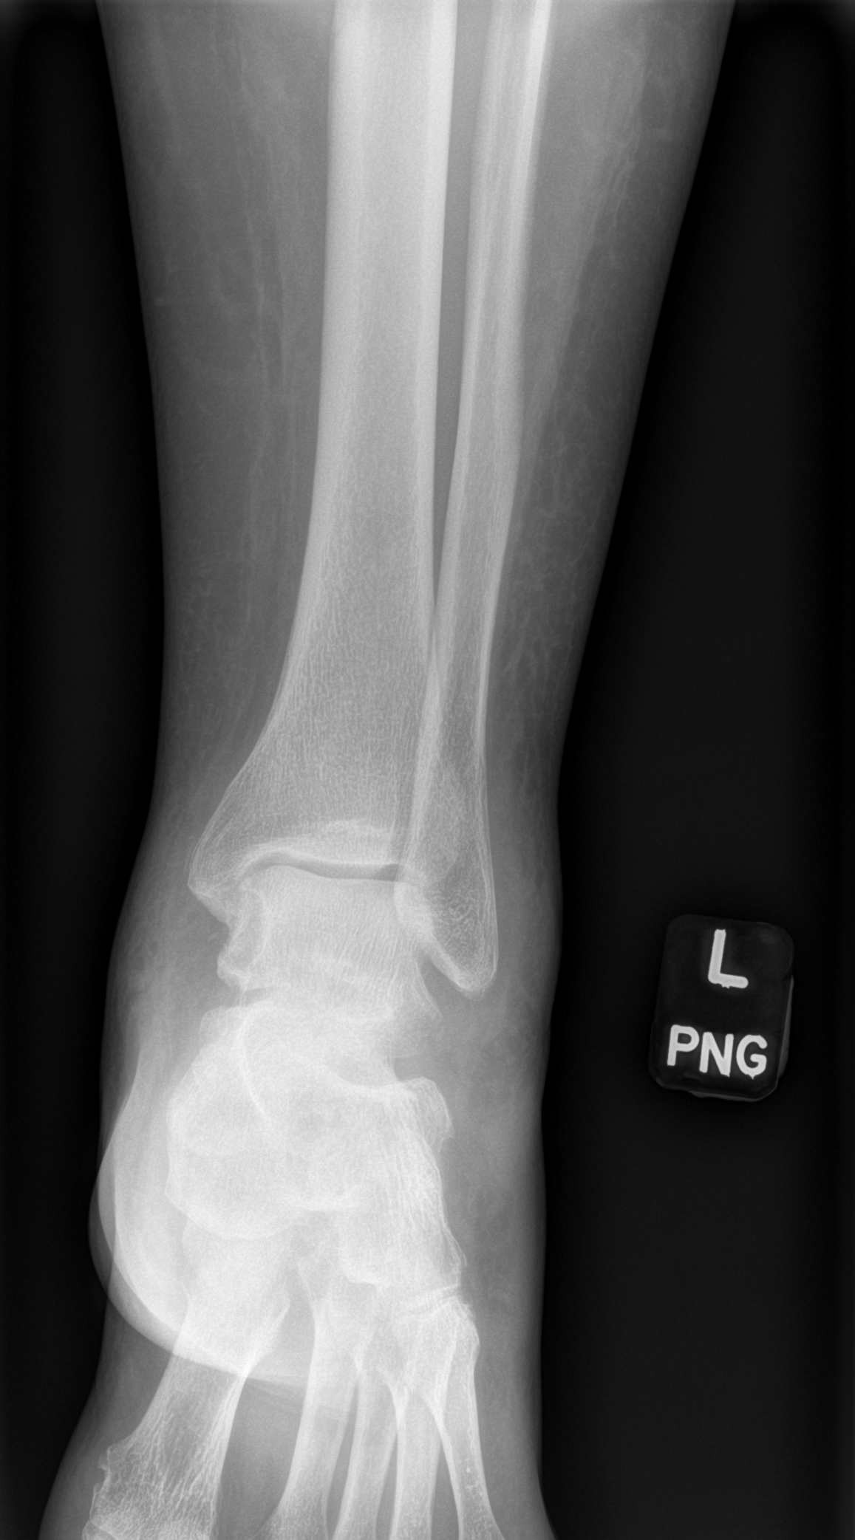

[ankle obl]
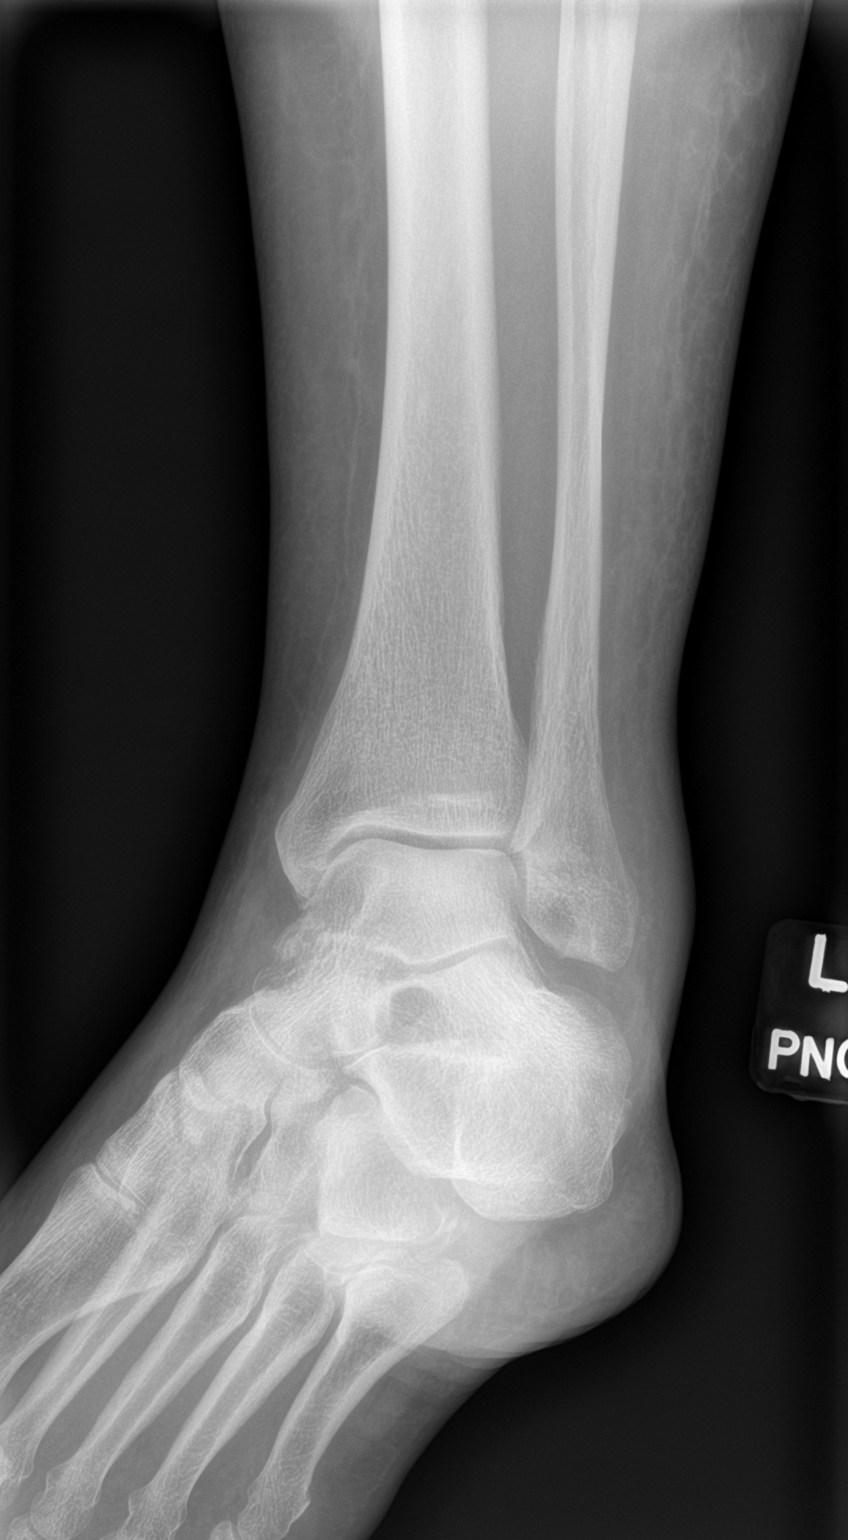

[ankle lat]
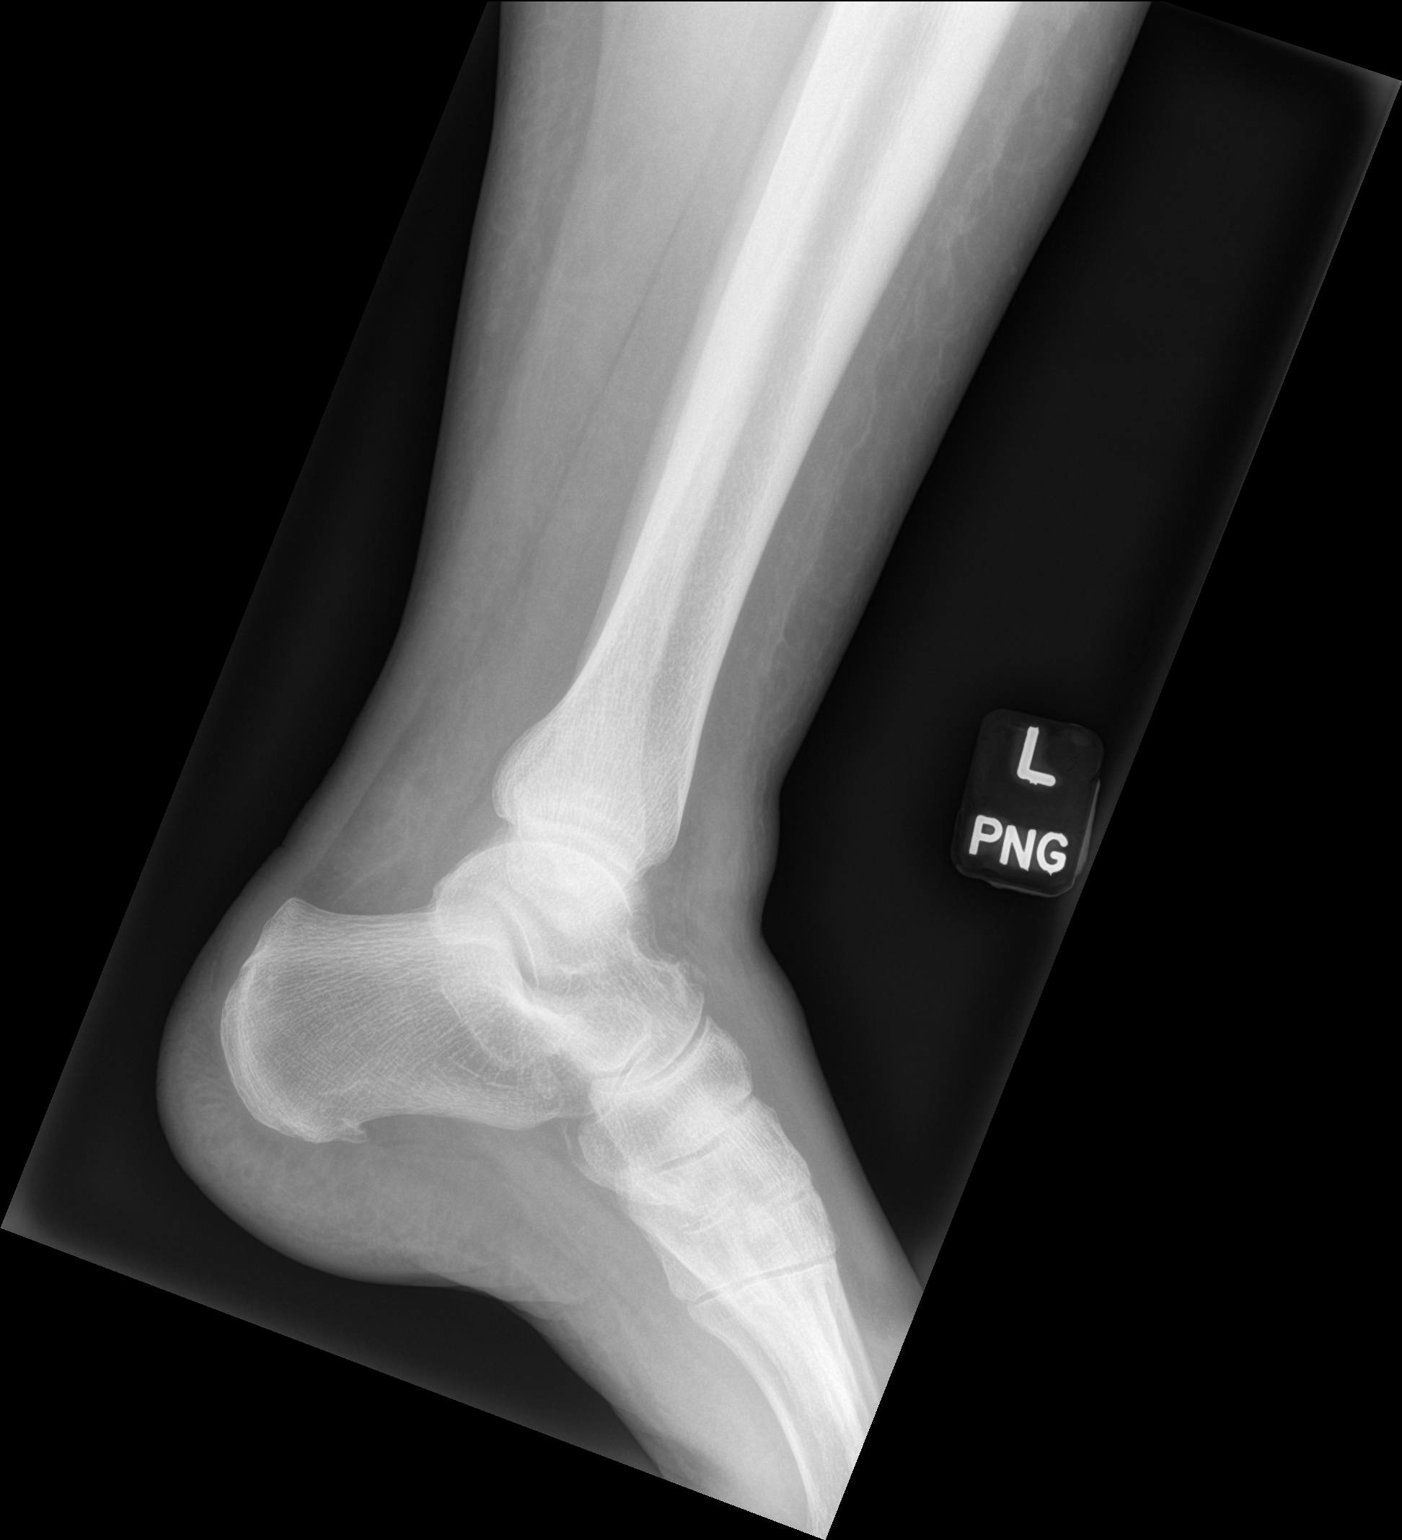

[3 of 3 positions shown; findings below may reference images not displayed]

FINDINGS: Generalized soft tissue swelling is noted about the ankle. Calcaneal
spurring is noted. Small bony densities are noted along the dorsal
aspect of the talus distally consistent with small avulsions. No
other focal abnormality is noted.
IMPRESSION: Bony densities consistent with avulsion along the dorsal aspect of
the distal talus. Generalized soft tissue swelling is noted.

## 2021-11-21 ENCOUNTER — Ambulatory Visit: Payer: 59 | Admitting: Family Medicine

## 2021-11-23 ENCOUNTER — Encounter: Payer: Self-pay | Admitting: Family Medicine

## 2021-12-03 ENCOUNTER — Telehealth: Payer: Self-pay | Admitting: Hematology and Oncology

## 2021-12-03 NOTE — Telephone Encounter (Signed)
Sch per 1/6 inbasket, left msg

## 2021-12-04 NOTE — Progress Notes (Signed)
[facility] [place] [address] [place] Phone: [phone] Subjective:   [name], am serving as a scribe for Dr. Hulan Saas. This visit occurred during the SARS-CoV-2 public health emergency.  Safety protocols were in place, including screening questions prior to the visit, additional usage of staff PPE, and extensive cleaning of exam room while observing appropriate contact time as indicated for disinfecting solutions.   I'm seeing this patient by the request  of:  Rita Ohara, MD  CC: Foot pain and knee pain.  EPP:[id number]  10/24/2021 Patient given injection today and tolerated the procedure well.  Patient continued to have effusion noted.  Discussed with patient that I do think that this will be more beneficial to try to decrease the inflammation and see how much she responds.  Could be a candidate for potential PRP of the ankle joint.  Discussed continuing the right age-related foot wear.  Worsening pain or any redness patient is to seek medical attention immediately.  Patient wants to avoid any type of surgical intervention whatsoever.  Updated 12/05/2021 Candie Gintz is a 66 y.o. female coming in with complaint of foot pain doing well. No pain. Right knee may have twisted it during a workout. Knee flexion hurts more than extension on machine. Dull achy pain. No other complaints.       Past Medical History:  Diagnosis Date   Allergy    Anxiety    Depression, major, in remission ([place]) 2006   when daughter dx'd with autoimmune disorder   Family history of breast cancer    Family history of cancer tonsil    Family history of thyroid cancer    Generalized anxiety disorder    generalized, and related to mammograms and flying   Glaucoma suspect    sees Dr. Katy Fitch, has all been okay, sees him qoyr   Herpes labialis    Hypercholesterolemia    high HDL, borderline LDL   Hypothyroidism    treated by Dr. Chalmers Cater    Multinodular goiter    followed by Dr. 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 Gillian Scarce   Social History   Socioeconomic History   Marital status: Married    Spouse name: Not on file   Number of children: Not on file   Years of education: Not on file   Highest education level: Not on file  Occupational History   Occupation: yoga teacher  Tobacco Use   Smoking status: Never   Smokeless tobacco: Never  Vaping Use   Vaping Use: Never used  Substance and Sexual Activity   Alcohol use: Yes    Alcohol/week: 0.0 standard drinks    Comment: 2 Vodka sodas/day   Drug use: No   Sexual activity: Yes    Partners: Male    Birth control/protection: Post-menopausal  Other Topics Concern   Not on file  Social History Narrative   Pre-COVID, taught yoga at multiple places.    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  Engaged to be married. (comes home for her IVIG treatments still)  1 dog (Malti-poo)   Volunteers at the [place] Strain: Not on file  Food Insecurity: Not on file  Transportation Needs: Not on file  Physical Activity: Not on file  Stress: Not on file  Social Connections: Not on file   Allergies  Allergen Reactions   Demerol [Meperidine] Other (See Comments)    Low bp, passes out   Family History  Problem Relation Age of Onset   Lupus Mother    Cancer Father 63       thymic cancer   COPD Father    Heart disease Father        CHF   Asthma Father    Cancer Sister    Breast cancer Sister 44       "best kind", lumpectomy and radiation   Dermatomyositis  Daughter 54       juvenile form   Irritable bowel syndrome Daughter    Colon cancer Maternal Grandmother        80's   Cancer Maternal Grandmother    Hypertension Paternal Grandmother    Hyperlipidemia Paternal Grandmother    Thyroid disease Paternal Grandmother        had goiter removed   Stroke Paternal Grandmother 43   Hypertension Paternal Grandfather    Hyperlipidemia Paternal Grandfather    Heart disease Paternal Grandfather        MI first in his 18's   Stroke Paternal Grandfather    Cancer Paternal Uncle        started on tonsils (smoker)   Breast cancer Paternal Aunt 52   Breast cancer Cousin 68   Breast cancer Cousin 45       (the mother of the 50 y/o cousin with br ca   Thyroid cancer Cousin    Diabetes Neg Hx    Ovarian cancer Neg Hx     Current Outpatient Medications (Endocrine & Metabolic):    levothyroxine (SYNTHROID, LEVOTHROID) 75 MCG tablet, Take 75 mcg by mouth daily before breakfast.  Current Outpatient Medications (Cardiovascular):    rosuvastatin (CRESTOR) 10 MG tablet, Take 1 tablet (10 mg total) by mouth daily.  Current Outpatient Medications (Respiratory):    fexofenadine (ALLEGRA) 180 MG tablet, Take 180 mg by mouth daily.   montelukast (SINGULAIR) 10 MG tablet, Take 1 tablet (10 mg total) by mouth at bedtime.   Triamcinolone Acetonide (NASACORT AQ NA), Place 2 sprays into the nose daily.    Current Outpatient Medications (Other):    ALPRAZolam (XANAX) 0.5 MG tablet, TAKE 0.5-1 TABLETS (0.25-0.5 MG TOTAL) BY MOUTH 3 (THREE) TIMES DAILY AS NEEDED FOR ANXIETY OR SLEEP.   buPROPion (WELLBUTRIN XL) 150 MG 24 hr tablet, Take 1 tablet (150 mg total) by mouth daily.   busPIRone (BUSPAR) 15 MG tablet, Take 1 tablet (15 mg total) by mouth 2 (two) times daily.   Calcium 250 MG CAPS, Take 2 capsules by mouth daily.   citalopram (CELEXA) 40 MG tablet, Take 1 tablet (40 mg total) by mouth daily.   doxycycline (VIBRA-TABS) 100 MG tablet, Take 1 tablet (100 mg  total) by mouth 2 (two) times daily.   hydrocortisone 2.5 % ointment, Apply topically 2 (two) times daily.   Magnesium 500 MG TABS, Take 1 tablet by mouth daily.   MILK THISTLE PO, Take 1 capsule by mouth daily.   Misc Natural Products (TART CHERRY ADVANCED PO), Take 1 capsule by mouth daily.   Multiple Vitamins-Minerals (MULTIVITAMIN WITH  MINERALS) tablet, Take 1 tablet by mouth daily.   mupirocin ointment (BACTROBAN) 2 %, Apply 1 application topically 2 (two) times daily.   NON FORMULARY, Take 2 capsules by mouth daily.   NON FORMULARY, Take 2 capsules by mouth daily.   Omega-3 Fatty Acids (FISH OIL) 1000 MG CPDR, Take 2,000 mg by mouth daily.   tamoxifen (NOLVADEX) 20 MG tablet, Take 1 tablet (20 mg total) by mouth daily.   valACYclovir (VALTREX) 1000 MG tablet, TAKE 2 TABLETS AT ONSET OF COLD SORE. REPEAT ONCE IN 12 HOURS (4 PILLS/COURSE)   vitamin C (ASCORBIC ACID) 500 MG tablet, Take 500 mg by mouth daily.   Vitamin D, Ergocalciferol, (DRISDOL) 1.25 MG (50000 UNIT) CAPS capsule, Take 1 capsule (50,000 Units total) by mouth every 7 (seven) days.   Review of Systems:  No headache, visual changes, nausea, vomiting, diarrhea, constipation, dizziness, abdominal pain, skin rash, fevers, chills, night sweats, weight loss, swollen lymph nodes, body aches, joint swelling, chest pain, shortness of breath, mood changes. POSITIVE muscle aches  Objective  Blood pressure 110/80, pulse 91, height 5\' 6"  (1.676 m), weight 173 lb (78.5 kg), last menstrual period 11/18/2008, SpO2 95 %.   General: No apparent distress alert and oriented x3 mood and affect normal, dressed appropriately.  HEENT: Pupils equal, extraocular movements intact  Respiratory: Patient's speak in full sentences and does not appear short of breath  Cardiovascular: No lower extremity edema, non tender, no erythema  Gait antalgic MSK: Patient left ankle only has some scar tissue.  No significant swelling.  Good range of motion  noted. Right knee exam does have good range of motion but lacks the last 5 degrees of flexion of the knee.  Tender to palpation just inferior and lateral to the right patella tendon.  No significant instability noted on exam today.    Impression and Recommendations:     The above documentation has been reviewed and is accurate and complete Lyndal Pulley, DO

## 2021-12-05 ENCOUNTER — Ambulatory Visit: Payer: 59 | Admitting: Family Medicine

## 2021-12-05 ENCOUNTER — Ambulatory Visit (INDEPENDENT_AMBULATORY_CARE_PROVIDER_SITE_OTHER): Payer: 59

## 2021-12-05 ENCOUNTER — Other Ambulatory Visit: Payer: Self-pay

## 2021-12-05 VITALS — BP 110/80 | HR 91 | Ht 66.0 in | Wt 173.0 lb

## 2021-12-05 DIAGNOSIS — M25561 Pain in right knee: Secondary | ICD-10-CM

## 2021-12-05 DIAGNOSIS — S8265XD Nondisplaced fracture of lateral malleolus of left fibula, subsequent encounter for closed fracture with routine healing: Secondary | ICD-10-CM | POA: Diagnosis not present

## 2021-12-05 NOTE — Assessment & Plan Note (Signed)
Patient does have what appears to be more tightness noted of the knee with flexion.  All the pain seems to be around the patella tendon.  No significant inflammation of the tendon the patient does have on ultrasound clinically a possible bursitis.  Seems to be more chronic.  Could be a potential ganglion cyst as well.  At this moment patient would like to try conservative therapy and we will monitor the range of motion.  X-rays pending to make sure there is nothing intra-articular that could be contributing to the loss of mild range of motion.  Worsening pain patient is to see [place] sooner.  Follow-up with me again in 4 to 6 weeks.

## 2021-12-05 NOTE — Patient Instructions (Signed)
Xray today Be careful with ROM See you again in 5-6 weeks to make sure it doesn't enlarge

## 2021-12-05 NOTE — Assessment & Plan Note (Signed)
Currently resolved at this time.  Still has some very mild tightness but nothing severe.  Patient does have the scar tissue formation noted that likely contributes to some of the discomfort but not any increase in instability.  For this problem can follow-up as needed.

## 2021-12-11 ENCOUNTER — Ambulatory Visit: Payer: 59 | Admitting: Hematology and Oncology

## 2021-12-19 NOTE — Progress Notes (Signed)
Patient Care Team: Rita Ohara, MD as PCP - General (Family Medicine)  DIAGNOSIS:    ICD-10-CM   1. At high risk for breast cancer  Z91.89         CHIEF COMPLIANT: Follow-up of increased life time risk of breast cancer.  INTERVAL HISTORY: Rachel Juarez is a 66 y.o. with above-mentioned history of increased life time risk of breast cancer. She presents to the clinic today for follow-up.  Previously she was offered tamoxifen therapy but because of extensive side effect profile she decided to hold off on it.  She is extremely anxious about risk of breast cancer because both her sisters had breast cancers and she is extremely paranoid and worried that she might get breast cancer.  ALLERGIES:  is allergic to demerol [meperidine].  MEDICATIONS:  Current Outpatient Medications  Medication Sig Dispense Refill   ALPRAZolam (XANAX) 0.5 MG tablet TAKE 0.5-1 TABLETS (0.25-0.5 MG TOTAL) BY MOUTH 3 (THREE) TIMES DAILY AS NEEDED FOR ANXIETY OR SLEEP. 15 tablet 0   buPROPion (WELLBUTRIN XL) 150 MG 24 hr tablet Take 1 tablet (150 mg total) by mouth daily. 90 tablet 2   busPIRone (BUSPAR) 15 MG tablet Take 1 tablet (15 mg total) by mouth 2 (two) times daily. 180 tablet 2   Calcium 250 MG CAPS Take 2 capsules by mouth daily.     citalopram (CELEXA) 40 MG tablet Take 1 tablet (40 mg total) by mouth daily. 90 tablet 2   doxycycline (VIBRA-TABS) 100 MG tablet Take 1 tablet (100 mg total) by mouth 2 (two) times daily. 20 tablet 0   fexofenadine (ALLEGRA) 180 MG tablet Take 180 mg by mouth daily.     hydrocortisone 2.5 % ointment Apply topically 2 (two) times daily. 30 g 0   levothyroxine (SYNTHROID, LEVOTHROID) 75 MCG tablet Take 75 mcg by mouth daily before breakfast.     Magnesium 500 MG TABS Take 1 tablet by mouth daily.     MILK THISTLE PO Take 1 capsule by mouth daily.     Misc Natural Products (TART CHERRY ADVANCED PO) Take 1 capsule by mouth daily.     montelukast (SINGULAIR) 10 MG tablet  Take 1 tablet (10 mg total) by mouth at bedtime. 90 tablet 3   Multiple Vitamins-Minerals (MULTIVITAMIN WITH MINERALS) tablet Take 1 tablet by mouth daily.     mupirocin ointment (BACTROBAN) 2 % Apply 1 application topically 2 (two) times daily. 22 g 0   NON FORMULARY Take 2 capsules by mouth daily.     NON FORMULARY Take 2 capsules by mouth daily.     Omega-3 Fatty Acids (FISH OIL) 1000 MG CPDR Take 2,000 mg by mouth daily.     rosuvastatin (CRESTOR) 10 MG tablet Take 1 tablet (10 mg total) by mouth daily. 90 tablet 3   tamoxifen (NOLVADEX) 20 MG tablet Take 1 tablet (20 mg total) by mouth daily. 30 tablet 0   Triamcinolone Acetonide (NASACORT AQ NA) Place 2 sprays into the nose daily.     valACYclovir (VALTREX) 1000 MG tablet TAKE 2 TABLETS AT ONSET OF COLD SORE. REPEAT ONCE IN 12 HOURS (4 PILLS/COURSE) 28 tablet 0   vitamin C (ASCORBIC ACID) 500 MG tablet Take 500 mg by mouth daily.     Vitamin D, Ergocalciferol, (DRISDOL) 1.25 MG (50000 UNIT) CAPS capsule Take 1 capsule (50,000 Units total) by mouth every 7 (seven) days. 12 capsule 0   No current facility-administered medications for this visit.    PHYSICAL  EXAMINATION: ECOG PERFORMANCE STATUS: 1 - Symptomatic but completely ambulatory  Vitals:   12/21/21 0933  BP: 107/65  Pulse: (!) 104  Resp: 18  Temp: (!) 97.4 F (36.3 C)  SpO2: 98%   Filed Weights   12/21/21 0933  Weight: 170 lb 12.8 oz (77.5 kg)      LABORATORY DATA:  I have reviewed the data as listed CMP Latest Ref Rng & Units 01/10/2021 01/10/2021 10/04/2020  Glucose 70 - 99 mg/dL - 89 -  BUN 6 - 23 mg/dL - 13 -  Creatinine 0.40 - 1.20 mg/dL - 0.80 -  Sodium 135 - 145 mEq/L - 139 -  Potassium 3.5 - 5.1 mEq/L - 4.8 -  Chloride 96 - 112 mEq/L - 103 -  CO2 19 - 32 mEq/L - 28 -  Calcium 8.6 - 10.4 mg/dL 9.5 9.5 -  Total Protein 6.0 - 8.3 g/dL - 6.8 6.8  Total Bilirubin 0.2 - 1.2 mg/dL - 0.5 0.4  Alkaline Phos 39 - 117 U/L - 70 86  AST 0 - 37 U/L - 21 25  ALT 0 -  35 U/L - 29 37(H)    Lab Results  Component Value Date   WBC 4.5 01/10/2021   HGB 14.3 01/10/2021   HCT 41.6 01/10/2021   MCV 100.9 (H) 01/10/2021   PLT 222.0 01/10/2021   NEUTROABS 2.5 01/10/2021    ASSESSMENT & PLAN:  At high risk for breast cancer Genetic testing: Negative Age of menarche: 19 First live birth at age 74 Oral contraceptive pill use for about 30 years.  No use of HRT.  She is postmenopausal Family history: Breast cancer in maternal aunt was diagnosed in her mid 38s, maternal cousin diagnosed at age of 43 and a cousin's daughter who was diagnosed with breast cancer at the age of 74.  Maternal grandmother had colon cancer in her 43s.  Father had thymic cancer.  Another paternal aunt had breast cancer at 20 and is currently living.  Tyrer-Cuzick risk: Lifetime risk: 24% Gail risk model: 5-year risk: 4.7%  Breast cancer surveillance: 1.  Breast MRI 08/26/2021: Benign, breast density category C 2. mammogram 01/27/2021 [facility]: Benign breast density category D  Current treatment for risk reduction: Tamoxifen 5 mg daily starting 12/21/2021 I discussed with her the results of TAM-01 clinical trial that showed excellent benefits even at 5 mg of tamoxifen without any major adverse effects.  Therefore we are starting her at 5 mg daily.  Return to clinic in 3 months to assess tolerability to tamoxifen. I sent a request for breast MRIs to be done every year in October.    No orders of the defined types were placed in this encounter.  The patient has a good understanding of the overall plan. she agrees with it. she will call with any problems that may develop before the next visit here.  Total time spent: 45 mins including face to face time and time spent for planning, charting and coordination of care  Rulon Eisenmenger, MD, MPH 12/21/2021  I, Thana Ates, am acting as scribe for Dr. Nicholas Lose.  I have reviewed the above documentation for accuracy and completeness, and I  agree with the above.

## 2021-12-21 ENCOUNTER — Inpatient Hospital Stay: Payer: 59 | Attending: Hematology and Oncology | Admitting: Hematology and Oncology

## 2021-12-21 ENCOUNTER — Other Ambulatory Visit: Payer: Self-pay

## 2021-12-21 DIAGNOSIS — Z79810 Long term (current) use of selective estrogen receptor modulators (SERMs): Secondary | ICD-10-CM | POA: Diagnosis not present

## 2021-12-21 DIAGNOSIS — Z79899 Other long term (current) drug therapy: Secondary | ICD-10-CM | POA: Diagnosis not present

## 2021-12-21 DIAGNOSIS — Z9189 Other specified personal risk factors, not elsewhere classified: Secondary | ICD-10-CM | POA: Diagnosis present

## 2021-12-21 DIAGNOSIS — Z803 Family history of malignant neoplasm of breast: Secondary | ICD-10-CM | POA: Diagnosis not present

## 2021-12-21 MED ORDER — TAMOXIFEN CITRATE 10 MG PO TABS: 5.0000 mg | ORAL_TABLET | Freq: Every day | ORAL | 3 refills | Status: DC

## 2021-12-21 NOTE — Assessment & Plan Note (Signed)
Genetic testing: Negative Age of menarche: 76 First live birth at age 66 Oral contraceptive pill use for about 30 years.  No use of HRT.  She is postmenopausal Family history: Breast cancer in maternal aunt was diagnosed in her mid 73s, maternal cousin diagnosed at age of 76 and a cousin's daughter who was diagnosed with breast cancer at the age of 39.  Maternal grandmother had colon cancer in her 37s.  Father had thymic cancer.  Another paternal aunt had breast cancer at 85 and is currently living.  Tyrer-Cuzick risk: Lifetime risk: 24% Gail risk model: 5-year risk: 4.7%  Breast cancer surveillance: 1.  Breast MRI 08/26/2021: Benign, breast density category C 2. mammogram 01/27/2021 [facility]: Benign breast density category D  Current treatment for risk reduction: Tamoxifen  Tamoxifen toxicities:  Return to clinic in 1 year for follow-up

## 2021-12-26 ENCOUNTER — Encounter: Payer: Self-pay | Admitting: Family Medicine

## 2021-12-26 NOTE — Progress Notes (Signed)
[place] Phone: [phone] Subjective:   [name], am serving as a scribe for Dr. Hulan Saas. This visit occurred during the SARS-CoV-2 public health emergency.  Safety protocols were in place, including screening questions prior to the visit, additional usage of staff PPE, and extensive cleaning of exam room while observing appropriate contact time as indicated for disinfecting solutions.  I'm seeing this patient by the request  of:  Rita Ohara, MD  CC: Right knee pain  YOV:[id number]  12/05/2021 Patient does have what appears to be more tightness noted of the knee with flexion.  All the pain seems to be around the patella tendon.  No significant inflammation of the tendon the patient does have on ultrasound clinically a possible bursitis.  Seems to be more chronic.  Could be a potential ganglion cyst as well.  At this moment patient would like to try conservative therapy and we will monitor the range of motion.  X-rays pending to make sure there is nothing intra-articular that could be contributing to the loss of mild range of motion.  Worsening pain patient is to see [place] sooner.  Follow-up with me again in 4 to 6 weeks.  Currently resolved at this time.  Still has some very mild tightness but nothing severe.  Patient does have the scar tissue formation noted that likely contributes to some of the discomfort but not any increase in instability.  For this problem can follow-up as needed.  Updated 12/27/2021 Latronda Spink is a 66 y.o. female coming in with complaint of right knee pain. Pain is worsening. Achy that radiates into upper thigh. Painful with sitting and walking on treadmill. Unable to perform deep knee bends. Using Advil.       Past Medical History:  Diagnosis Date   Allergy    Anxiety    Depression, major, in remission ([place]) 2006   when daughter dx'd with autoimmune disorder   Family  history of breast cancer    Family history of cancer tonsil    Family history of thyroid cancer    Generalized anxiety disorder    generalized, and related to mammograms and flying   Glaucoma suspect    sees Dr. Katy Fitch, has all been okay, sees him qoyr   Herpes labialis    Hypercholesterolemia    high HDL, borderline LDL   Hypothyroidism    treated by Dr. Chalmers Cater   Multinodular goiter    followed by Dr. 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 Gillian Scarce   Social History   Socioeconomic History   Marital status: Married    Spouse name: Not on file   Number of children: Not on file   Years of education: Not on file   Highest education level: Not on file  Occupational History   Occupation: yoga teacher  Tobacco Use   Smoking status: Never   Smokeless tobacco: Never  Vaping Use   Vaping Use: Never used  Substance and Sexual Activity   Alcohol use: Yes    Alcohol/week: 0.0 standard drinks    Comment: 2 Vodka sodas/day   Drug use: No   Sexual activity: Yes    Partners: Male  Birth control/protection: Post-menopausal  Other Topics Concern   Not on file  Social History Narrative   Pre-COVID, taught yoga at multiple places.    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  Engaged to be married. (comes home for her IVIG treatments still)   1 dog (Malt[employer]poo)   Volunteers at the [employer] Strain: Not on file  Food Insecurity: Not on file  Transportation Needs: Not on file  Physical Activity: Not on file  Stress: Not on file  Social Connections: Not on file   Allergies   Allergen Reactions   Demerol [Meperidine] Other (See Comments)    Low bp, passes out   Family History  Problem Relation Age of Onset   Lupus Mother    Cancer Father 68       thymic cancer   COPD Father    Heart disease Father        CHF   Asthma Father    Cancer Sister    Breast cancer Sister 23       "best kind", lumpectomy and radiation   Dermatomyositis Daughter 34       juvenile form   Irritable bowel syndrome Daughter    Colon cancer Maternal Grandmother        80's   Cancer Maternal Grandmother    Hypertension Paternal Grandmother    Hyperlipidemia Paternal Grandmother    Thyroid disease Paternal Grandmother        had goiter removed   Stroke Paternal Grandmother 29   Hypertension Paternal Grandfather    Hyperlipidemia Paternal Grandfather    Heart disease Paternal Grandfather        MI first in his 8's   Stroke Paternal Grandfather    Cancer Paternal Uncle        started on tonsils (smoker)   Breast cancer Paternal Aunt 22   Breast cancer Cousin 64   Breast cancer Cousin 70       (the mother of the 47 y/o cousin with br ca   Thyroid cancer Cousin    Diabetes Neg Hx    Ovarian cancer Neg Hx     Current Outpatient Medications (Endocrine & Metabolic):    levothyroxine (SYNTHROID, LEVOTHROID) 75 MCG tablet, Take 75 mcg by mouth daily before breakfast.  Current Outpatient Medications (Cardiovascular):    rosuvastatin (CRESTOR) 10 MG tablet, Take 1 tablet (10 mg total) by mouth daily.  Current Outpatient Medications (Respiratory):    fexofenadine (ALLEGRA) 180 MG tablet, Take 180 mg by mouth daily.   montelukast (SINGULAIR) 10 MG tablet, Take 1 tablet (10 mg total) by mouth at bedtime.   Triamcinolone Acetonide (NASACORT AQ NA), Place 2 sprays into the nose daily.    Current Outpatient Medications (Other):    ALPRAZolam (XANAX) 0.5 MG tablet, TAKE 0.5-1 TABLETS (0.25-0.5 MG TOTAL) BY MOUTH 3 (THREE) TIMES DAILY AS NEEDED FOR ANXIETY OR SLEEP.   busPIRone  (BUSPAR) 15 MG tablet, Take 1 tablet (15 mg total) by mouth 2 (two) times daily.   Calcium 250 MG CAPS, Take 2 capsules by mouth daily.   citalopram (CELEXA) 40 MG tablet, Take 1 tablet (40 mg total) by mouth daily.   doxycycline (VIBRA-TABS) 100 MG tablet, Take 1 tablet (100 mg total) by mouth 2 (two) times daily.  hydrocortisone 2.5 % ointment, Apply topically 2 (two) times daily.   Magnesium 500 MG TABS, Take 1 tablet by mouth daily.   MILK THISTLE PO, Take 1 capsule by mouth daily.   Misc Natural Products (TART CHERRY ADVANCED PO), Take 1 capsule by mouth daily.   Multiple Vitamins-Minerals (MULTIVITAMIN WITH MINERALS) tablet, Take 1 tablet by mouth daily.   mupirocin ointment (BACTROBAN) 2 %, Apply 1 application topically 2 (two) times daily.   NON FORMULARY, Take 2 capsules by mouth daily.   NON FORMULARY, Take 2 capsules by mouth daily.   Omega-3 Fatty Acids (FISH OIL) 1000 MG CPDR, Take 2,000 mg by mouth daily.   tamoxifen (NOLVADEX) 10 MG tablet, Take 0.5 tablets (5 mg total) by mouth daily.   valACYclovir (VALTREX) 1000 MG tablet, TAKE 2 TABLETS AT ONSET OF COLD SORE. REPEAT ONCE IN 12 HOURS (4 PILLS/COURSE)   vitamin C (ASCORBIC ACID) 500 MG tablet, Take 500 mg by mouth daily.   Vitamin D, Ergocalciferol, (DRISDOL) 1.25 MG (50000 UNIT) CAPS capsule, Take 1 capsule (50,000 Units total) by mouth every 7 (seven) days.   Reviewed prior external information including notes and imaging from  primary care provider As well as notes that were available from care everywhere and other healthcare systems.  Past medical history, social, surgical and family history all reviewed in electronic medical record.  No pertanent information unless stated regarding to the chief complaint.   Review of Systems:  No headache, visual changes, nausea, vomiting, diarrhea, constipation, dizziness, abdominal pain, skin rash, fevers, chills, night sweats, weight loss, swollen lymph nodes, body aches,  chest  pain, shortness of breath, mood changes. POSITIVE muscle aches, joint swelling  Objective  Blood pressure 104/70, pulse 83, height 5\' 6"  (1.676 m), weight 172 lb (78 kg), last menstrual period 11/18/2008, SpO2 99 %.   General: No apparent distress alert and oriented x3 mood and affect normal, dressed appropriately.  HEENT: Pupils equal, extraocular movements intact  Respiratory: Patient's speak in full sentences and does not appear short of breath  Cardiovascular: No lower extremity edema, non tender, no erythema  Gait n antalgic MSK: Right knee does have some trace effusion noted today.  Patient lacks last 10 degrees of flexion.  Mild positive worsening pain over the anterior aspect of the knee over the patella tendon.  Limited muscular skeletal ultrasound was performed and interpreted by Hulan Saas, M  Limited ultrasound shows the patient does have hypoechoic changes within the patellofemoral joint that is consistent with an effusion.  Patient noted does have abnormality noted of the synovium with either calcific deposits.  Does not appear to be loose bodies with compression and stays localized.  Patient does also have what appears to be now more of an outpouching of the synovium on the anterior aspect of the knee underneath the patella tendon than truly a cyst formation.  Same calcific changes noted of the synovium. Impression: Synovitis of the knee with inflammation.  After informed written and verbal consent, patient was seated on exam table. Right knee was prepped with alcohol swab and utilizing anterolateral approach, patient's right knee space was injected with 4:1  marcaine 0.5%: Kenalog 40mg /dL. Patient tolerated the procedure well without immediate complications.    Impression and Recommendations:     The above documentation has been reviewed and is accurate and complete Lyndal Pulley, DO

## 2021-12-27 ENCOUNTER — Ambulatory Visit: Payer: Self-pay

## 2021-12-27 ENCOUNTER — Encounter: Payer: Self-pay | Admitting: Family Medicine

## 2021-12-27 ENCOUNTER — Ambulatory Visit: Payer: 59 | Admitting: Family Medicine

## 2021-12-27 ENCOUNTER — Other Ambulatory Visit: Payer: Self-pay

## 2021-12-27 VITALS — BP 104/70 | HR 83 | Ht 66.0 in | Wt 172.0 lb

## 2021-12-27 DIAGNOSIS — M25561 Pain in right knee: Secondary | ICD-10-CM

## 2021-12-27 NOTE — Patient Instructions (Addendum)
Injected knee today Ok to workout tmrw Ice / IBU 6 hours See me in 4 weeks

## 2021-12-27 NOTE — Assessment & Plan Note (Signed)
Patient did have what appeared to be a abnormality noted of the synovium.  This could be just more secondary to calcific synovitis but differential does include a possibility of an early PVNS.  That is a low likelihood but we did discuss with patient.  Patient was having a trace effusion also of the patellofemoral joints were given a steroid injection today.  Tolerated the procedure well.  Hopefully patient will make improvement.  If no improvement or worsening pain in the next 4 weeks I do feel that we will need to consider the possibility of advanced imaging.  Follow-up with me again in 4 weeks.

## 2022-01-09 ENCOUNTER — Ambulatory Visit: Payer: 59 | Admitting: Family Medicine

## 2022-01-09 ENCOUNTER — Ambulatory Visit: Payer: 59 | Admitting: Sports Medicine

## 2022-01-09 ENCOUNTER — Other Ambulatory Visit: Payer: Self-pay

## 2022-01-09 VITALS — BP 120/80 | HR 77 | Ht 66.0 in | Wt 173.0 lb

## 2022-01-09 DIAGNOSIS — M25561 Pain in right knee: Secondary | ICD-10-CM

## 2022-01-09 DIAGNOSIS — G8929 Other chronic pain: Secondary | ICD-10-CM | POA: Diagnosis not present

## 2022-01-09 MED ORDER — MELOXICAM 15 MG PO TABS: 15.0000 mg | ORAL_TABLET | Freq: Every day | ORAL | 0 refills | Status: DC

## 2022-01-09 NOTE — Patient Instructions (Addendum)
Good to see you  - Start meloxicam 15 mg daily x2 weeks.  If still having pain after 2 weeks, complete 3rd-week of meloxicam. May use remaining meloxicam as needed once daily for pain control.  Do not to use additional NSAIDs while taking meloxicam.  May use Tylenol [phone] mg to 3 times a day for breakthrough pain. 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

## 2022-01-09 NOTE — Progress Notes (Signed)
Rachel Mccreedy D.Juarez [place] Phone: [phone]   Assessment and Plan:     1. Chronic pain of right knee -Chronic with exacerbation, subsequent sports medicine visit - Recurrence of anterior right knee pain without known MOI starting 01/08/2022, which was previously significantly improving from CSI on 12/27/2021 - Differential includes medial plica syndrome versus patellofemoral syndrome - As patient just had CSI on 12/27/2021, will not repeat CSI at this time - Start meloxicam 15 mg daily x2 weeks.  If still having pain after 2 weeks, complete 3rd-week of meloxicam. May use remaining meloxicam as needed once daily for pain control.  Do not to use additional NSAIDs while taking meloxicam.  May use Tylenol [phone] mg to 3 times a day for breakthrough pain.  -Start patellofemoral exercises as HEP and with physical therapy  Pertinent previous records reviewed include none   Follow Up: 2 to 3 weeks for reevaluation.  If no improvement or worsening of symptoms, would consider knee MRI at that time   Subjective:   I, Rachel Juarez, am serving as a Education administrator for Doctor Glennon Mac  Chief Complaint: right knee pain   HPI:  12/05/2021 Patient does have what appears to be more tightness noted of the knee with flexion.  All the pain seems to be around the patella tendon.  No significant inflammation of the tendon the patient does have on ultrasound clinically a possible bursitis.  Seems to be more chronic.  Could be a potential ganglion cyst as well.  At this moment patient would like to try conservative therapy and we will monitor the range of motion.  X-rays pending to make sure there is nothing intra-articular that could be contributing to the loss of mild range of motion.  Worsening pain patient is to see [place] sooner.  Follow-up with me again in 4 to 6 weeks.   Currently resolved at this time.  Still has some very mild  tightness but nothing severe.  Patient does have the scar tissue formation noted that likely contributes to some of the discomfort but not any increase in instability.  For this problem can follow-up as needed.   Updated 12/27/2021 Rachel Juarez is a 66 y.o. female coming in with complaint of right knee pain. Pain is worsening. Achy that radiates into upper thigh. Painful with sitting and walking on treadmill. Unable to perform deep knee bends. Using Advil.   01/09/22 Patient states that R knee started hurting yesterday 01/08/2022, hs been seeing doctor smith for a possible cyst or could possibly the synovial tissue floating around had CSI last visit , No MOI , this is the worst it hurts has been taking Advil and that's the only way she can walk feels better when its flexed . Feels like its really tight    Relevant Historical Information: None pertinent  Additional pertinent review of systems negative.   Current Outpatient Medications:    ALPRAZolam (XANAX) 0.5 MG tablet, TAKE 0.5-1 TABLETS (0.25-0.5 MG TOTAL) BY MOUTH 3 (THREE) TIMES DAILY AS NEEDED FOR ANXIETY OR SLEEP., Disp: 15 tablet, Rfl: 0   busPIRone (BUSPAR) 15 MG tablet, Take 1 tablet (15 mg total) by mouth 2 (two) times daily., Disp: 180 tablet, Rfl: 2   Calcium 250 MG CAPS, Take 2 capsules by mouth daily., Disp: , Rfl:    citalopram (CELEXA) 40 MG tablet, Take 1 tablet (40 mg total) by mouth daily., Disp: 90 tablet, Rfl: 2  doxycycline (VIBRA-TABS) 100 MG tablet, Take 1 tablet (100 mg total) by mouth 2 (two) times daily., Disp: 20 tablet, Rfl: 0   fexofenadine (ALLEGRA) 180 MG tablet, Take 180 mg by mouth daily., Disp: , Rfl:    hydrocortisone 2.5 % ointment, Apply topically 2 (two) times daily., Disp: 30 g, Rfl: 0   levothyroxine (SYNTHROID, LEVOTHROID) 75 MCG tablet, Take 75 mcg by mouth daily before breakfast., Disp: , Rfl:    Magnesium 500 MG TABS, Take 1 tablet by mouth daily., Disp: , Rfl:    meloxicam (MOBIC) 15 MG  tablet, Take 1 tablet (15 mg total) by mouth daily., Disp: 30 tablet, Rfl: 0   MILK THISTLE PO, Take 1 capsule by mouth daily., Disp: , Rfl:    Misc Natural Products (TART CHERRY ADVANCED PO), Take 1 capsule by mouth daily., Disp: , Rfl:    montelukast (SINGULAIR) 10 MG tablet, Take 1 tablet (10 mg total) by mouth at bedtime., Disp: 90 tablet, Rfl: 3   Multiple Vitamins-Minerals (MULTIVITAMIN WITH MINERALS) tablet, Take 1 tablet by mouth daily., Disp: , Rfl:    mupirocin ointment (BACTROBAN) 2 %, Apply 1 application topically 2 (two) times daily., Disp: 22 g, Rfl: 0   NON FORMULARY, Take 2 capsules by mouth daily., Disp: , Rfl:    NON FORMULARY, Take 2 capsules by mouth daily., Disp: , Rfl:    Omega-3 Fatty Acids (FISH OIL) 1000 MG CPDR, Take 2,000 mg by mouth daily., Disp: , Rfl:    rosuvastatin (CRESTOR) 10 MG tablet, Take 1 tablet (10 mg total) by mouth daily., Disp: 90 tablet, Rfl: 3   tamoxifen (NOLVADEX) 10 MG tablet, Take 0.5 tablets (5 mg total) by mouth daily., Disp: 60 tablet, Rfl: 3   Triamcinolone Acetonide (NASACORT AQ NA), Place 2 sprays into the nose daily., Disp: , Rfl:    valACYclovir (VALTREX) 1000 MG tablet, TAKE 2 TABLETS AT ONSET OF COLD SORE. REPEAT ONCE IN 12 HOURS (4 PILLS/COURSE), Disp: 28 tablet, Rfl: 0   vitamin C (ASCORBIC ACID) 500 MG tablet, Take 500 mg by mouth daily., Disp: , Rfl:    Vitamin D, Ergocalciferol, (DRISDOL) 1.25 MG (50000 UNIT) CAPS capsule, Take 1 capsule (50,000 Units total) by mouth every 7 (seven) days., Disp: 12 capsule, Rfl: 0   Objective:     Vitals:   01/09/22 0948  BP: 120/80  Pulse: 77  SpO2: 96%  Weight: 173 lb (78.5 kg)  Height: 5\' 6"  (1.676 m)      Body mass index is 27.92 kg/m.    Physical Exam:    General:  awake, alert oriented, no acute distress nontoxic Skin: no suspicious lesions or rashes Neuro:sensation intact, no deficits, strength 5/5 with no deficits, no atrophy, normal muscle tone Psych: No signs of anxiety,  depression or other mood disorder  Right knee: No swelling No deformity Neg fluid wave, joint milking Crepitus ROM Flex 110 , Ext 0  TTP medial patella, plica NTTP over the quad tendon, medial fem condyle, lat fem condyle,  patella tendon, tibial tuberostiy, fibular head, posterior fossa, pes anserine bursa, gerdy's tubercle, medial jt line, lateral jt line Positive patellar grind Neg anterior and posterior drawer Neg lachman Neg sag sign Negative varus stress Negative valgus stress Negative McMurray Positive Thessaly for anterior knee pain Anterior knee pain with 1 leg squat  Gait normal    Electronically signed by:  Rachel Mccreedy D.[facility] 11:51 AM 01/09/22

## 2022-01-10 ENCOUNTER — Ambulatory Visit: Payer: 59 | Admitting: Sports Medicine

## 2022-01-10 VITALS — Ht 66.0 in | Wt 173.0 lb

## 2022-01-10 DIAGNOSIS — M25561 Pain in right knee: Secondary | ICD-10-CM | POA: Diagnosis not present

## 2022-01-10 DIAGNOSIS — G8929 Other chronic pain: Secondary | ICD-10-CM

## 2022-01-10 DIAGNOSIS — M25461 Effusion, right knee: Secondary | ICD-10-CM | POA: Diagnosis not present

## 2022-01-10 DIAGNOSIS — M25361 Other instability, right knee: Secondary | ICD-10-CM | POA: Diagnosis not present

## 2022-01-10 MED ORDER — TRAMADOL HCL 50 MG PO TABS: 50.0000 mg | ORAL_TABLET | Freq: Three times a day (TID) | ORAL | 0 refills | Status: AC | PRN

## 2022-01-10 NOTE — Patient Instructions (Addendum)
Good to see you MRI referral  Continue - Start meloxicam 15 mg daily x2 weeks.  If still having pain after 2 weeks, complete 3rd-week of meloxicam. May use remaining meloxicam as needed once daily for pain control.  Do not to use additional NSAIDs while taking meloxicam.  May use Tylenol [phone] mg to 3 times a day for breakthrough pain. Tramadol 50 mg every 8 hours as needed  Contact us for a follow up 3 days after your MRI to discuss your results

## 2022-01-10 NOTE — Progress Notes (Signed)
[name] D.[place] Phone: [phone]   Assessment and Plan:     1. Chronic pain of right knee 2. Knee instability, right 3. Knee effusion, right 4. Acute pain of right knee -Chronic with exacerbation, subsequent sports medicine visit - Patient experienced a loud popping sensation in her right knee followed by instability, inability to bear weight on right knee, right knee effusion - Due to patient's failure to improve with conservative therapy, current severe pain, knee instability, we will proceed with MRI of right knee to further evaluate - Recommended weightbearing as tolerated using crutches -Continue meloxicam 15 mg daily x2 weeks.  If still having pain after 2 weeks, complete 3rd-week of meloxicam. May use remaining meloxicam as needed once daily for pain control.  Do not to use additional NSAIDs while taking meloxicam.  May use Tylenol [phone] mg to 3 times a day for breakthrough pain.  -As meloxicam and Tylenol have been ineffective so far to improve pain, will prescribe tramadol 50 mg 3 times daily as needed for breakthrough pain, 15 tablets, 0 refills    Pertinent previous records reviewed include none   Follow Up: After MRI to review results   Subjective:   I, Moenique Parris, am serving as a Education administrator for Doctor Glennon Mac  Chief Complaint: right knee pain   HPI:  12/05/2021 Patient does have what appears to be more tightness noted of the knee with flexion.  All the pain seems to be around the patella tendon.  No significant inflammation of the tendon the patient does have on ultrasound clinically a possible bursitis.  Seems to be more chronic.  Could be a potential ganglion cyst as well.  At this moment patient would like to try conservative therapy and we will monitor the range of motion.  X-rays pending to make sure there is nothing intra-articular that could be contributing to the  loss of mild range of motion.  Worsening pain patient is to see [place] sooner.  Follow-up with me again in 4 to 6 weeks.   Currently resolved at this time.  Still has some very mild tightness but nothing severe.  Patient does have the scar tissue formation noted that likely contributes to some of the discomfort but not any increase in instability.  For this problem can follow-up as needed.   Updated 12/27/2021 Caramia Boutin is a 66 y.o. female coming in with complaint of right knee pain. Pain is worsening. Achy that radiates into upper thigh. Painful with sitting and walking on treadmill. Unable to perform deep knee bends. Using Advil.    01/09/22 Patient states that R knee started hurting yesterday 01/08/2022, hs been seeing doctor smith for a possible cyst or could possibly the synovial tissue floating around had CSI last visit , No MOI , this is the worst it hurts has been taking Advil and that's the only way she can walk feels better when its flexed . Feels like its really tight   01/10/22 Patient states that knee went pop and immediate pain nauseating  pain and swollen , medial knee when she presses the knee cap medial it radiates pain all the way up the leg to the hip flexor and the hamstring   Relevant Historical Information: None pertinent  Additional pertinent review of systems negative.   Current Outpatient Medications:    traMADol (ULTRAM) 50 MG tablet, Take 1 tablet (50 mg total) by mouth every 8 (eight)  hours as needed for up to 5 days., Disp: 15 tablet, Rfl: 0   ALPRAZolam (XANAX) 0.5 MG tablet, TAKE 0.5-1 TABLETS (0.25-0.5 MG TOTAL) BY MOUTH 3 (THREE) TIMES DAILY AS NEEDED FOR ANXIETY OR SLEEP., Disp: 15 tablet, Rfl: 0   busPIRone (BUSPAR) 15 MG tablet, Take 1 tablet (15 mg total) by mouth 2 (two) times daily., Disp: 180 tablet, Rfl: 2   Calcium 250 MG CAPS, Take 2 capsules by mouth daily., Disp: , Rfl:    citalopram (CELEXA) 40 MG tablet, Take 1 tablet (40 mg total) by mouth  daily., Disp: 90 tablet, Rfl: 2   doxycycline (VIBRA-TABS) 100 MG tablet, Take 1 tablet (100 mg total) by mouth 2 (two) times daily., Disp: 20 tablet, Rfl: 0   fexofenadine (ALLEGRA) 180 MG tablet, Take 180 mg by mouth daily., Disp: , Rfl:    hydrocortisone 2.5 % ointment, Apply topically 2 (two) times daily., Disp: 30 g, Rfl: 0   levothyroxine (SYNTHROID, LEVOTHROID) 75 MCG tablet, Take 75 mcg by mouth daily before breakfast., Disp: , Rfl:    Magnesium 500 MG TABS, Take 1 tablet by mouth daily., Disp: , Rfl:    meloxicam (MOBIC) 15 MG tablet, Take 1 tablet (15 mg total) by mouth daily., Disp: 30 tablet, Rfl: 0   MILK THISTLE PO, Take 1 capsule by mouth daily., Disp: , Rfl:    Misc Natural Products (TART CHERRY ADVANCED PO), Take 1 capsule by mouth daily., Disp: , Rfl:    montelukast (SINGULAIR) 10 MG tablet, Take 1 tablet (10 mg total) by mouth at bedtime., Disp: 90 tablet, Rfl: 3   Multiple Vitamins-Minerals (MULTIVITAMIN WITH MINERALS) tablet, Take 1 tablet by mouth daily., Disp: , Rfl:    mupirocin ointment (BACTROBAN) 2 %, Apply 1 application topically 2 (two) times daily., Disp: 22 g, Rfl: 0   NON FORMULARY, Take 2 capsules by mouth daily., Disp: , Rfl:    NON FORMULARY, Take 2 capsules by mouth daily., Disp: , Rfl:    Omega-3 Fatty Acids (FISH OIL) 1000 MG CPDR, Take 2,000 mg by mouth daily., Disp: , Rfl:    rosuvastatin (CRESTOR) 10 MG tablet, Take 1 tablet (10 mg total) by mouth daily., Disp: 90 tablet, Rfl: 3   tamoxifen (NOLVADEX) 10 MG tablet, Take 0.5 tablets (5 mg total) by mouth daily., Disp: 60 tablet, Rfl: 3   Triamcinolone Acetonide (NASACORT AQ NA), Place 2 sprays into the nose daily., Disp: , Rfl:    valACYclovir (VALTREX) 1000 MG tablet, TAKE 2 TABLETS AT ONSET OF COLD SORE. REPEAT ONCE IN 12 HOURS (4 PILLS/COURSE), Disp: 28 tablet, Rfl: 0   vitamin C (ASCORBIC ACID) 500 MG tablet, Take 500 mg by mouth daily., Disp: , Rfl:    Vitamin D, Ergocalciferol, (DRISDOL) 1.25 MG  (50000 UNIT) CAPS capsule, Take 1 capsule (50,000 Units total) by mouth every 7 (seven) days., Disp: 12 capsule, Rfl: 0   Objective:     Vitals:   01/10/22 1458  Weight: 173 lb (78.5 kg)  Height: 5\' 6"  (1.676 m)      Body mass index is 27.92 kg/m.    Physical Exam:    Right knee: Mild swelling No deformity Positive fluid wave, joint milking Crepitus ROM Flex 90, Ext 30  TTP severely medial patella, plica, and moderately TTP to lateral patella NTTP over the quad tendon, medial fem condyle, lat fem condyle,  patella tendon, tibial tuberostiy, fibular head, posterior fossa, pes anserine bursa, gerdy's tubercle, medial jt line, lateral jt  line Positive patellar grind Unable to perform special testing due to pain Severe pain with knee extension  Gait abnormal with patient unable to bear weight.  Using wheelchair in office   Electronically signed by:  [name] D.[facility] 3:22 PM 01/10/22

## 2022-01-14 ENCOUNTER — Encounter: Payer: Self-pay | Admitting: Sports Medicine

## 2022-01-15 ENCOUNTER — Other Ambulatory Visit: Payer: Self-pay | Admitting: Sports Medicine

## 2022-01-15 DIAGNOSIS — M25361 Other instability, right knee: Secondary | ICD-10-CM

## 2022-01-15 DIAGNOSIS — G8929 Other chronic pain: Secondary | ICD-10-CM

## 2022-01-15 DIAGNOSIS — M25561 Pain in right knee: Secondary | ICD-10-CM

## 2022-01-15 DIAGNOSIS — M25461 Effusion, right knee: Secondary | ICD-10-CM

## 2022-01-15 NOTE — Progress Notes (Signed)
Mri

## 2022-01-15 NOTE — Telephone Encounter (Signed)
Pt was messaged and given the phone number to [facility]

## 2022-01-15 NOTE — Telephone Encounter (Signed)
Pt was messaged and notified that a new referral to [facility] imagining was placed

## 2022-01-22 ENCOUNTER — Ambulatory Visit
Admission: RE | Admit: 2022-01-22 | Discharge: 2022-01-22 | Disposition: A | Discharge status: Home / Self Care | Payer: 59 | Source: Ambulatory Visit | Attending: Sports Medicine | Admitting: Sports Medicine

## 2022-01-22 ENCOUNTER — Other Ambulatory Visit: Payer: Self-pay

## 2022-01-22 ENCOUNTER — Other Ambulatory Visit: Payer: Self-pay | Admitting: Family Medicine

## 2022-01-22 DIAGNOSIS — J302 Other seasonal allergic rhinitis: Secondary | ICD-10-CM

## 2022-01-22 DIAGNOSIS — M25361 Other instability, right knee: Secondary | ICD-10-CM

## 2022-01-22 DIAGNOSIS — G8929 Other chronic pain: Secondary | ICD-10-CM

## 2022-01-22 DIAGNOSIS — M25461 Effusion, right knee: Secondary | ICD-10-CM

## 2022-01-22 DIAGNOSIS — E7800 Pure hypercholesterolemia, unspecified: Secondary | ICD-10-CM

## 2022-01-23 ENCOUNTER — Other Ambulatory Visit: Payer: Self-pay

## 2022-01-23 DIAGNOSIS — G8929 Other chronic pain: Secondary | ICD-10-CM

## 2022-01-24 ENCOUNTER — Ambulatory Visit: Payer: 59 | Admitting: Family Medicine

## 2022-01-29 ENCOUNTER — Other Ambulatory Visit: Payer: Self-pay

## 2022-01-29 ENCOUNTER — Ambulatory Visit: Payer: 59 | Admitting: Dermatology

## 2022-01-29 DIAGNOSIS — L821 Other seborrheic keratosis: Secondary | ICD-10-CM

## 2022-01-29 DIAGNOSIS — D1801 Hemangioma of skin and subcutaneous tissue: Secondary | ICD-10-CM | POA: Diagnosis not present

## 2022-01-29 DIAGNOSIS — Z1283 Encounter for screening for malignant neoplasm of skin: Secondary | ICD-10-CM

## 2022-01-29 DIAGNOSIS — L820 Inflamed seborrheic keratosis: Secondary | ICD-10-CM

## 2022-01-30 NOTE — Progress Notes (Signed)
[place] Phone: [phone] Subjective:   [name], am serving as a scribe for Dr. Hulan Saas.  This visit occurred during the SARS-CoV-2 public health emergency.  Safety protocols were in place, including screening questions prior to the visit, additional usage of staff PPE, and extensive cleaning of exam room while observing appropriate contact time as indicated for disinfecting solutions.    I'm seeing this patient by the request  of:  Rita Ohara, MD  CC: Knee pain follow-up  [id number]  Rachel Juarez is a 66 y.o. female coming in with complaint of R knee pain. Patient saw Dr. Glennon Mac a few weeks ago. Patient states that she has an appointment with Dr. Rhona Raider on Monday. Pain is not improving. Patient states that hurts all the time and is unable to do anything more than regular daily activities.  If she twists the knee sometimes feels unstable.  Has to be even extremely careful when rolling over at night  MRI R knee 01/22/2022 IMPRESSION: 1. Focal tear of the root of the posterior horn of the medial meniscus. Moderate intrasubstance degeneration and swelling of the posterior horn and posterior segment of the body of the medial meniscus with mild-to-moderate extrusion of the body. 2. Moderate tricompartmental cartilage degenerative changes greatest within the patellar and medial compartments. 3. Small joint effusion. 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      Past Medical History:  Diagnosis Date   Allergy    Anxiety    Depression, major, in remission ([place]) 2006   when daughter dx'd with autoimmune disorder   Family history of breast cancer    Family history of cancer tonsil    Family history of thyroid cancer    Generalized anxiety disorder    generalized, and related to mammograms and flying   Glaucoma suspect    sees Dr. Katy Fitch,  has all been okay, sees him qoyr   Herpes labialis    Hypercholesterolemia    high HDL, borderline LDL   Hypothyroidism    treated by Dr. Chalmers Cater   Multinodular goiter    followed by Dr. 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 Gillian Scarce   Social History   Socioeconomic History   Marital status: Married    Spouse name: Not on file   Number of children: Not on file   Years of education: Not on file   Highest education level: Not on file  Occupational History   Occupation: yoga teacher  Tobacco Use   Smoking status: Never   Smokeless tobacco: Never  Vaping Use   Vaping Use: Never used  Substance and Sexual Activity   Alcohol use: Yes    Alcohol/week: 0.0 standard drinks    Comment: 2 Vodka sodas/day   Drug use: No   Sexual activity: Yes    Partners: Male    Birth control/protection: Post-menopausal  Other Topics Concern   Not on file  Social History Narrative   Pre-COVID, taught yoga at multiple places.    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  Engaged to be married. (comes home for her IVIG treatments still)   1 dog (Malt[employer]poo)   Volunteers at the [employer] Strain: Not on file  Food Insecurity: Not on file  Transportation Needs: Not on file  Physical Activity: Not on file  Stress: Not on file  Social Connections: Not on file   Allergies  Allergen Reactions   Demerol [Meperidine] Other (See Comments)    Low bp, passes out   Family History  Problem Relation Age of Onset   Lupus Mother    Cancer Father 4       thymic cancer   COPD Father    Heart  disease Father        CHF   Asthma Father    Cancer Sister    Breast cancer Sister 26       "best kind", lumpectomy and radiation   Dermatomyositis Daughter 66       juvenile form   Irritable bowel syndrome Daughter    Colon cancer Maternal Grandmother        80's   Cancer Maternal Grandmother    Hypertension Paternal Grandmother    Hyperlipidemia Paternal Grandmother    Thyroid disease Paternal Grandmother        had goiter removed   Stroke Paternal Grandmother 37   Hypertension Paternal Grandfather    Hyperlipidemia Paternal Grandfather    Heart disease Paternal Grandfather        MI first in his 48's   Stroke Paternal Grandfather    Cancer Paternal Uncle        started on tonsils (smoker)   Breast cancer Paternal Aunt 66   Breast cancer Cousin 22   Breast cancer Cousin 35       (the mother of the 23 y/o cousin with br ca   Thyroid cancer Cousin    Diabetes Neg Hx    Ovarian cancer Neg Hx     Current Outpatient Medications (Endocrine & Metabolic):    levothyroxine (SYNTHROID, LEVOTHROID) 75 MCG tablet, Take 75 mcg by mouth daily before breakfast.  Current Outpatient Medications (Cardiovascular):    rosuvastatin (CRESTOR) 10 MG tablet, TAKE 1 TABLET BY MOUTH EVERY DAY  Current Outpatient Medications (Respiratory):    fexofenadine (ALLEGRA) 180 MG tablet, Take 180 mg by mouth daily.   montelukast (SINGULAIR) 10 MG tablet, TAKE 1 TABLET BY MOUTH EVERYDAY AT BEDTIME   Triamcinolone Acetonide (NASACORT AQ NA), Place 2 sprays into the nose daily.  Current Outpatient Medications (Analgesics):    meloxicam (MOBIC) 15 MG tablet, Take 1 tablet (15 mg total) by mouth daily.   Current Outpatient Medications (Other):    ALPRAZolam (XANAX) 0.5 MG tablet, TAKE 0.5-1 TABLETS (0.25-0.5 MG TOTAL) BY MOUTH 3 (THREE) TIMES DAILY AS NEEDED FOR ANXIETY OR SLEEP.   busPIRone (BUSPAR) 15 MG tablet, Take 1 tablet (15 mg total) by mouth 2 (two) times daily.   Calcium 250 MG CAPS, Take 2  capsules by mouth daily.   citalopram (CELEXA) 40 MG tablet, Take 1 tablet (40 mg total) by mouth daily.   doxycycline (VIBRA-TABS) 100 MG tablet, Take 1 tablet (100 mg total) by mouth 2 (two) times daily.   hydrocortisone 2.5 % ointment, Apply topically 2 (two) times daily.   Magnesium 500 MG TABS, Take 1 tablet by mouth daily.   MILK THISTLE PO, Take 1 capsule by mouth daily.   Misc Natural  Products (TART CHERRY ADVANCED PO), Take 1 capsule by mouth daily.   Multiple Vitamins-Minerals (MULTIVITAMIN WITH MINERALS) tablet, Take 1 tablet by mouth daily.   mupirocin ointment (BACTROBAN) 2 %, Apply 1 application topically 2 (two) times daily.   NON FORMULARY, Take 2 capsules by mouth daily.   NON FORMULARY, Take 2 capsules by mouth daily.   Omega-3 Fatty Acids (FISH OIL) 1000 MG CPDR, Take 2,000 mg by mouth daily.   tamoxifen (NOLVADEX) 10 MG tablet, Take 0.5 tablets (5 mg total) by mouth daily.   valACYclovir (VALTREX) 1000 MG tablet, TAKE 2 TABLETS AT ONSET OF COLD SORE. REPEAT ONCE IN 12 HOURS (4 PILLS/COURSE)   vitamin C (ASCORBIC ACID) 500 MG tablet, Take 500 mg by mouth daily.   Vitamin D, Ergocalciferol, (DRISDOL) 1.25 MG (50000 UNIT) CAPS capsule, Take 1 capsule (50,000 Units total) by mouth every 7 (seven) days.   Reviewed prior external information including notes and imaging from  primary care provider As well as notes that were available from care everywhere and other healthcare systems.  Past medical history, social, surgical and family history all reviewed in electronic medical record.  No pertanent information unless stated regarding to the chief complaint.   Review of Systems:  No headache, visual changes, nausea, vomiting, diarrhea, constipation, dizziness, abdominal pain, skin rash, fevers, chills, night sweats, weight loss, swollen lymph nodes, body aches, j chest pain, shortness of breath, mood changes. POSITIVE muscle aches, joint swelling  Objective  Blood pressure  110/82, pulse 74, height '5\' 6"'$  (1.676 m), weight 173 lb (78.5 kg), last menstrual period 11/18/2008, SpO2 98 %.   General: No apparent distress alert and oriented x3 mood and affect normal, dressed appropriately.  HEENT: Pupils equal, extraocular movements intact  Respiratory: Patient's speak in full sentences and does not appear short of breath  Cardiovascular: No lower extremity edema, non tender, no erythema  Gait antalgic gait Right knee does have effusion noted.  Lacks last 10 degrees of flexion.  Fullness noted in the popliteal area.  Tenderness to palpation in the diffusely on the medial compartment.  Positive McMurray's noted.  Audible clicking.    Impression and Recommendations:     The above documentation has been reviewed and is accurate and complete Lyndal Pulley, DO

## 2022-01-31 ENCOUNTER — Other Ambulatory Visit: Payer: Self-pay | Admitting: Endocrinology

## 2022-01-31 ENCOUNTER — Ambulatory Visit: Payer: 59 | Admitting: Family Medicine

## 2022-01-31 ENCOUNTER — Ambulatory Visit: Payer: Self-pay

## 2022-01-31 ENCOUNTER — Other Ambulatory Visit: Payer: Self-pay

## 2022-01-31 VITALS — BP 110/82 | HR 74 | Ht 66.0 in | Wt 173.0 lb

## 2022-01-31 DIAGNOSIS — M25561 Pain in right knee: Secondary | ICD-10-CM | POA: Diagnosis not present

## 2022-01-31 DIAGNOSIS — E049 Nontoxic goiter, unspecified: Secondary | ICD-10-CM

## 2022-01-31 NOTE — Assessment & Plan Note (Signed)
Patient has a large meniscal tear noted of the posterior horn of the medial meniscus.  I do believe the patient has fairly significant extrusion of the body of the meniscus causing impingement.  Also moderate tricompartmental osteoarthritic changes also noted.  Patient does have a fairly large moderate Baker's cyst.  Due to all of these I do not think that conservative therapy would be beneficial especially secondary to the instability.  Patient has been referred to orthopedic surgery and I do think patient is likely a candidate for possible arthroscopic procedure.  Depending on this we did discuss with her that likely will need replacement in the future but hopefully not at this juncture.  Patient is willing to have the surgery if this would allow her to get back to an improved quality of life.  Patient knows we are here for any other questions.  Total time reviewing MRIs as well as discussing with patient greater than 31 minutes ?

## 2022-02-04 ENCOUNTER — Encounter: Payer: Self-pay | Admitting: Family Medicine

## 2022-02-05 ENCOUNTER — Other Ambulatory Visit: Payer: Self-pay | Admitting: Endocrinology

## 2022-02-05 DIAGNOSIS — M949 Disorder of cartilage, unspecified: Secondary | ICD-10-CM

## 2022-02-07 ENCOUNTER — Ambulatory Visit
Admission: RE | Admit: 2022-02-07 | Discharge: 2022-02-07 | Disposition: A | Discharge status: Home / Self Care | Payer: 59 | Source: Ambulatory Visit | Attending: Endocrinology | Admitting: Endocrinology

## 2022-02-07 ENCOUNTER — Other Ambulatory Visit: Payer: Self-pay

## 2022-02-08 ENCOUNTER — Encounter: Payer: Self-pay | Admitting: Family Medicine

## 2022-02-09 ENCOUNTER — Encounter: Payer: Self-pay | Admitting: Dermatology

## 2022-02-09 NOTE — Progress Notes (Signed)
? ?  Follow-Up Visit ?  ?Subjective  ?Rachel Juarez is a 66 y.o. female who presents for the following: Annual Exam (Pt here for annual. Pt has no hx of skin cancer ). ? ?General skin examination ?Location:  ?Duration:  ?Quality:  ?Associated Signs/Symptoms: ?Modifying Factors:  ?Severity:  ?Timing: ?Context:  ? ?Objective  ?Well appearing patient in no apparent distress; mood and affect are within normal limits. ?General skin examination: No atypical pigmented lesions or nonmelanoma skin cancer. ? ?Multiple 1 mm smooth red dermal papules ? ?Half dozen textured tan and brown flattopped papules, compatible dermoscopy ? ?Left Dorsal Hand ?Inflamed pink flattopped crusts, I SK versus AK ? ? ? ?A full examination was performed including scalp, head, eyes, ears, nose, lips, neck, chest, axillae, abdomen, back, buttocks, bilateral upper extremities, bilateral lower extremities, hands, feet, fingers, toes, fingernails, and toenails. All findings within normal limits unless otherwise noted below.  Areas beneath undergarments not fully examined. ? ? ?Assessment & Plan  ? ? ?Screening exam for skin cancer ? ?Annual skin examination, encouraged to self examine with spouse twice annually.  Continued ultraviolet protection. ? ?Cherry angioma ? ?No intervention necessary ? ?Seborrheic keratosis ? ?Leave if stable ? ?Inflamed seborrheic keratosis ?Left Dorsal Hand ? ?Destruction of lesion - Left Dorsal Hand ?Complexity: simple   ?Destruction method: cryotherapy   ?Informed consent: discussed and consent obtained   ?Timeout:  patient name, date of birth, surgical site, and procedure verified ?Lesion destroyed using liquid nitrogen: Yes   ?Cryotherapy cycles:  3 ?Outcome: patient tolerated procedure well with no complications   ?Post-procedure details: wound care instructions given   ? ? ? ? ? ?I, Lavonna Monarch, MD, have reviewed all documentation for this visit.  The documentation on 02/09/22 for the exam, diagnosis, procedures, and  orders are all accurate and complete. ?

## 2022-02-13 ENCOUNTER — Telehealth: Payer: Self-pay | Admitting: *Deleted

## 2022-02-13 NOTE — Progress Notes (Signed)
Chief Complaint  ?Patient presents with  ? Surgical Clearance  ?  Patient is having right knee surgery 02/21/22 Dr. Rhona Raider.   ? ? ?Patient presents for surgical clearance for knee surgery.  She will be having arthroscopic R knee surgery for torn medial meniscus on 3/30 with Dr. Rhona Raider.  She had sudden onset of pain/pop/swelling about 4-5 weeks ago.  She has had some intermittent knee pain since her 20's.  She had been having R knee pain which was felt to be related to her ankle injury.  Had tried PT, and other conservative measures.  ? ?Still meeting with her trainer, unable to do cardio for the last 4-5 weeks, but doing upper body work. ?Did not have any exertional symptoms (chest/jaw/neck/arm pain or DOE) prior to her recent knee injury. ? ?Hyperlipidemia:  Patient is compliant with taking rosuvastatin '10mg'$  daily, and is tolerating this without side effects. ?She continues to try and limit her cheese, eats red meat once a week, french fries 1-2x/week and no other fried foods.  No eggs. ? ?Breast cancer risk:  She is currently on '5mg'$  of tamoxifen, with plans to take it for 5 years.  This is for prevention of breast cancer.  She reports tolerating the medication without side effects---has the same mild hot flashes, tolerable, that she did prior to starting therapy.  She mentioned that the oncologist suggested getting pelvic ultrasounds every 2 years while on therapy, due to risks of uterine cancer (to be ordered by me?) ?She denies any vaginal bleeding or spotting. ? ? ?Lab Results  ?Component Value Date  ? CHOL 194 03/05/2021  ? HDL 85 03/05/2021  ? [place] 98 03/05/2021  ? TRIG 61 03/05/2021  ? CHOLHDL 2.3 03/05/2021  ? ?Only prior surgery was cataract surgery in 2021.  No complications. ? ?H/o Vit D deficiency--currently taking D only from her daily MVI.  At one point she was taking too much from the liquid.  She took a prescription course at one point from sports med doctor--chart reviewed, appears she likely  completed last 12 week course in 07/2021, on daily MVI since without any separate D. ? ?She has an upcoming appointment with Dr. Chalmers Cater, and  is requesting repeat vitamin D, along with chem and TSH to be done today, with results forwarded to Dr. Chalmers Cater. ? ?Allergies:  having some itchy eyes, but otherwise doing well on singulair, nasacort and allegra daily, along with OTC eye drops. ?  ? ?PMH, PSH, SH and FH were reviewed ? ?Outpatient Encounter Medications as of 02/14/2022  ?Medication Sig Note  ? busPIRone (BUSPAR) 15 MG tablet Take 1 tablet (15 mg total) by mouth 2 (two) times daily.   ? Calcium 250 MG CAPS Take 2 capsules by mouth daily. 01/26/2020: Takes 1 calcium tablet daily (unsure of dose)  ? citalopram (CELEXA) 40 MG tablet Take 1 tablet (40 mg total) by mouth daily.   ? fexofenadine (ALLEGRA) 180 MG tablet Take 180 mg by mouth daily.   ? levothyroxine (SYNTHROID, LEVOTHROID) 75 MCG tablet Take 75 mcg by mouth daily before breakfast.   ? Magnesium 500 MG TABS Take 1 tablet by mouth daily.   ? MILK THISTLE PO Take 1 capsule by mouth daily.   ? Misc Natural Products (TART CHERRY ADVANCED PO) Take 1 capsule by mouth daily.   ? montelukast (SINGULAIR) 10 MG tablet TAKE 1 TABLET BY MOUTH EVERYDAY AT BEDTIME   ? Multiple Vitamins-Minerals (MULTIVITAMIN WITH MINERALS) tablet Take 1 tablet by mouth  daily.   ? NON FORMULARY Take 2 capsules by mouth daily. 01/26/2020: DoTerra On Guard  ? NON FORMULARY Take 2 capsules by mouth daily. 01/26/2020: DoTerra Zendocrine  ? Omega-3 Fatty Acids (FISH OIL) 1000 MG CPDR Take 2,000 mg by mouth daily.   ? rosuvastatin (CRESTOR) 10 MG tablet TAKE 1 TABLET BY MOUTH EVERY DAY   ? tamoxifen (NOLVADEX) 10 MG tablet Take 0.5 tablets (5 mg total) by mouth daily.   ? Triamcinolone Acetonide (NASACORT AQ NA) Place 2 sprays into the nose daily.   ? vitamin C (ASCORBIC ACID) 500 MG tablet Take 500 mg by mouth daily.   ? ALPRAZolam (XANAX) 0.5 MG tablet TAKE 0.5-1 TABLETS (0.25-0.5 MG TOTAL) BY MOUTH  3 (THREE) TIMES DAILY AS NEEDED FOR ANXIETY OR SLEEP. (Patient not taking: Reported on 02/14/2022)   ? hydrocortisone 2.5 % ointment Apply topically 2 (two) times daily. (Patient not taking: Reported on 02/14/2022) 02/14/2022: prn  ? mupirocin ointment (BACTROBAN) 2 % Apply 1 application topically 2 (two) times daily. (Patient not taking: Reported on 02/14/2022)   ? valACYclovir (VALTREX) 1000 MG tablet TAKE 2 TABLETS AT ONSET OF COLD SORE. REPEAT ONCE IN 12 HOURS (4 PILLS/COURSE) (Patient not taking: Reported on 02/14/2022) 02/14/2022: prn  ? [DISCONTINUED] doxycycline (VIBRA-TABS) 100 MG tablet Take 1 tablet (100 mg total) by mouth 2 (two) times daily.   ? [DISCONTINUED] meloxicam (MOBIC) 15 MG tablet Take 1 tablet (15 mg total) by mouth daily.   ? [DISCONTINUED] Vitamin D, Ergocalciferol, (DRISDOL) 1.25 MG (50000 UNIT) CAPS capsule Take 1 capsule (50,000 Units total) by mouth every 7 (seven) days.   ? ?No facility-administered encounter medications on file as of 02/14/2022.  ? ?Today is the last day of all supplements prior to surgery ? ?Allergies  ?Allergen Reactions  ? Demerol [Meperidine] Other (See Comments)  ?  Low bp, passes out  ? ?ROS:  no fever, chills, URI symptoms, headaches, dizziness.  ?No chest pain, DOE, palpitations, edema. ?No GI or GU complaints. ?Anxiety doing pretty well overall. ?No change in mild hot flashes since started tamoxifen (preventatively). ? ? ?PHYSICAL EXAM: ? ?BP 110/62   Pulse 80   Ht '5\' 6"'$  (1.676 m)   Wt 173 lb 9.6 oz (78.7 kg)   LMP 11/18/2008   BMI 28.02 kg/m?  ? ?Wt Readings from Last 3 Encounters:  ?02/14/22 173 lb 9.6 oz (78.7 kg)  ?01/31/22 173 lb (78.5 kg)  ?01/10/22 173 lb (78.5 kg)  ? ? ?Well developed, well nourished patient, in no distress ?HEENT: conjunctiva and sclera are clear, EOMI, wearing mask ?Neck: No lymphadenopathy or thyromegaly, no carotid bruit ?Heart:  Regular rate and rhythm, no murmurs, rubs, gallops or ectopy ?Lungs:  Clear bilaterally, without  wheezes, rales or ronchi ?Abdomen:  Soft, nontender, nondistended, no hepatosplenomegaly or masses ?Extremities:  No clubbing, cyanosis or edema, 2+ pulses.  ?Neuro:  Alert and oriented x 3, normal gait, strength ?Back:  No spine or CVA tenderness ?Skin: no rashes, normal turgor ?Psych:  Normal mood, affect, hygiene and grooming, normal speech, eye contact ? ? ?EKG NSR rate 68, normal.  (Borderline LAE) ? ? ? ?ASSESSMENT/PLAN: ? ?Encounter for pre-operative cardiovascular clearance - Plan: EKG 12-Lead, Comprehensive metabolic panel, CBC with Differential/Platelet ? ?Vitamin D deficiency - currently on MVI with D - Plan: VITAMIN D 25 Hydroxy (Vit-D Deficiency, Fractures) ? ?Hypothyroidism, unspecified type - managed by Dr. Chalmers Cater, to forward her the results - Plan: TSH ? ?Pure hypercholesterolemia - tolerating Crestor, due  for recheck - Plan: Lipid panel ? ?Medication monitoring encounter - Plan: Comprehensive metabolic panel, CBC with Differential/Platelet, Lipid panel, TSH, VITAMIN D 25 Hydroxy (Vit-D Deficiency, Fractures) ? ?At high risk for breast cancer - on low dose tamoxifen without side effects. Advised that I have NOT seen GYN or ONC do routine pelvic US's while on therapy, but will research this ? ? ?Forward lab results to Dr. Chalmers Cater. ? ?F/u as scheduled for CPE in April. ? ?

## 2022-02-13 NOTE — Telephone Encounter (Signed)
Sure, if you can please put that in the CC when you room her so that I remember which tests to order--thanks! ?

## 2022-02-13 NOTE — Telephone Encounter (Signed)
When I called to confirm appt patient wanted me to let you know that she has an appt with Dr. Chalmers Cater in April. Dr. Chalmers Cater asked if you could draw a CMP, TSH and vit D with your labs tomorrow and send to her since patient was having labs.  ?

## 2022-02-14 ENCOUNTER — Ambulatory Visit: Payer: 59 | Admitting: Family Medicine

## 2022-02-14 ENCOUNTER — Encounter: Payer: Self-pay | Admitting: Family Medicine

## 2022-02-14 VITALS — BP 110/62 | HR 80 | Ht 66.0 in | Wt 173.6 lb

## 2022-02-14 DIAGNOSIS — Z01810 Encounter for preprocedural cardiovascular examination: Secondary | ICD-10-CM | POA: Diagnosis not present

## 2022-02-14 DIAGNOSIS — E7800 Pure hypercholesterolemia, unspecified: Secondary | ICD-10-CM | POA: Diagnosis not present

## 2022-02-14 DIAGNOSIS — Z5181 Encounter for therapeutic drug level monitoring: Secondary | ICD-10-CM

## 2022-02-14 DIAGNOSIS — Z9189 Other specified personal risk factors, not elsewhere classified: Secondary | ICD-10-CM

## 2022-02-14 DIAGNOSIS — E039 Hypothyroidism, unspecified: Secondary | ICD-10-CM | POA: Diagnosis not present

## 2022-02-14 DIAGNOSIS — E559 Vitamin D deficiency, unspecified: Secondary | ICD-10-CM | POA: Diagnosis not present

## 2022-02-15 ENCOUNTER — Encounter: Payer: Self-pay | Admitting: Family Medicine

## 2022-02-15 LAB — COMPREHENSIVE METABOLIC PANEL
ALT: 27 IU/L (ref 0–32)
AST: 20 IU/L (ref 0–40)
Albumin/Globulin Ratio: 2.4 — ABNORMAL HIGH (ref 1.2–2.2)
Albumin: 4.7 g/dL (ref 3.8–4.8)
Alkaline Phosphatase: 63 IU/L (ref 44–121)
BUN/Creatinine Ratio: 20 (ref 12–28)
BUN: 16 mg/dL (ref 8–27)
Bilirubin Total: 0.4 mg/dL (ref 0.0–1.2)
CO2: 25 mmol/L (ref 20–29)
Calcium: 9.9 mg/dL (ref 8.7–10.3)
Chloride: 104 mmol/L (ref 96–106)
Creatinine, Ser: 0.82 mg/dL (ref 0.57–1.00)
Globulin, Total: 2 g/dL (ref 1.5–4.5)
Glucose: 97 mg/dL (ref 70–99)
Potassium: 5.3 mmol/L — ABNORMAL HIGH (ref 3.5–5.2)
Sodium: 141 mmol/L (ref 134–144)
Total Protein: 6.7 g/dL (ref 6.0–8.5)
eGFR: 80 mL/min/{1.73_m2} (ref >59)

## 2022-02-15 LAB — CBC WITH DIFFERENTIAL/PLATELET
Basophils Absolute: 0 10*3/uL (ref 0.0–0.2)
Basos: 1 %
EOS (ABSOLUTE): 0.2 10*3/uL (ref 0.0–0.4)
Eos: 4 %
Hematocrit: 39.8 % (ref 34.0–46.6)
Hemoglobin: 13.4 g/dL (ref 11.1–15.9)
Immature Grans (Abs): 0 10*3/uL (ref 0.0–0.1)
Immature Granulocytes: 0 %
Lymphocytes Absolute: 1.4 10*3/uL (ref 0.7–3.1)
Lymphs: 36 %
MCH: 33.6 pg — ABNORMAL HIGH (ref 26.6–33.0)
MCHC: 33.7 g/dL (ref 31.5–35.7)
MCV: 100 fL — ABNORMAL HIGH (ref 79–97)
Monocytes Absolute: 0.3 10*3/uL (ref 0.1–0.9)
Monocytes: 8 %
Neutrophils Absolute: 2 10*3/uL (ref 1.4–7.0)
Neutrophils: 51 %
Platelets: 249 10*3/uL (ref 150–450)
RBC: 3.99 x10E6/uL (ref 3.77–5.28)
RDW: 12.1 % (ref 11.7–15.4)
WBC: 3.9 10*3/uL (ref 3.4–10.8)

## 2022-02-15 LAB — LIPID PANEL
Chol/HDL Ratio: 2.1 ratio (ref 0.0–4.4)
Cholesterol, Total: 180 mg/dL (ref 100–199)
HDL: 84 mg/dL (ref >39)
LDL Chol Calc (NIH): 84 mg/dL (ref 0–99)
Triglycerides: 64 mg/dL (ref 0–149)
VLDL Cholesterol Cal: 12 mg/dL (ref 5–40)

## 2022-02-15 LAB — TSH: TSH: 0.869 u[IU]/mL (ref 0.450–4.500)

## 2022-02-15 LAB — VITAMIN D 25 HYDROXY (VIT D DEFICIENCY, FRACTURES): Vit D, 25-Hydroxy: 34.6 ng/mL (ref 30.0–100.0)

## 2022-02-19 LAB — HM MAMMOGRAPHY

## 2022-02-21 ENCOUNTER — Encounter: Payer: Self-pay | Admitting: *Deleted

## 2022-02-21 HISTORY — PX: KNEE ARTHROSCOPY: SUR90

## 2022-02-22 ENCOUNTER — Encounter: Payer: Self-pay | Admitting: Family Medicine

## 2022-03-07 NOTE — Progress Notes (Signed)
HEMATOLOGY-ONCOLOGY TELEPHONE VISIT PROGRESS NOTE ? ?I connected with Rachel Juarez on 03/21/22 at  2:00 PM EDT by telephone and verified that I am speaking with the correct person using two identifiers.  ?I discussed the limitations, risks, security and privacy concerns of performing an evaluation and management service by telephone and the availability of in person appointments.  ?I also discussed with the patient that there may be a patient responsible charge related to this service. The patient expressed understanding and agreed to proceed.  ? ?History of Present Illness: Rachel Juarez is a 66 y.o. with above-mentioned history of increased life time risk of breast cancer. She presents to the clinic today via telephone follow-up.  She is tolerating tamoxifen extremely well without any problems or concerns.  She had mild hot flashes before starting tamoxifen and there are no different.  She denies any joint aches and pains. ?  ? ?REVIEW OF SYSTEMS:   ?Constitutional: Denies fevers, chills or abnormal weight loss ?All other systems were reviewed with the patient and are negative. ?Observations/Objective:  ? ?  ?Assessment Plan:  ?At high risk for breast cancer ?Genetic testing: Negative ?Age of menarche: 54 ?First live birth at age 77 ?Oral contraceptive pill use for about 30 years.  No use of HRT.  She is postmenopausal ?Family history: Breast cancer in maternal aunt was diagnosed in her mid 63s, maternal cousin diagnosed at age of 42 and a cousin's daughter who was diagnosed with breast cancer at the age of 80.  Maternal grandmother had colon cancer in her 58s.  Father had thymic cancer.  Another paternal aunt had breast cancer at 68 and is currently living. ?  ?Tyrer-Cuzick risk: Lifetime risk: 24% ?Baker Janus risk model: 5-year risk: 4.7% ?  ?Breast cancer surveillance: ?1.  Breast MRI 08/26/2021: Benign, breast density category C ?2. mammogram 01/27/2021 [facility]: Benign breast density category D ?  ?Current treatment for  risk reduction: Tamoxifen 5 mg daily starting 12/21/2021 ?Tamoxifen toxicities: No side effects ?Mild Hot flashes (same as before) ?No joint aches ? ?Return to clinic in 1 year for follow-up ? ? ?I discussed the assessment and treatment plan with the patient. The patient was provided an opportunity to ask questions and all were answered. The patient agreed with the plan and demonstrated an understanding of the instructions. The patient was advised to call back or seek an in-person evaluation if the symptoms worsen or if the condition fails to improve as anticipated.  ? ?I provided 12 minutes of non-face-to-face time during this encounter. Harriette Ohara, MD   ?Earlie Server am scribing for Dr. Lindi Adie ? ?I have reviewed the above documentation for accuracy and completeness, and I agree with the above. ?  ?

## 2022-03-12 NOTE — Patient Instructions (Signed)

## 2022-03-12 NOTE — Progress Notes (Signed)
?Chief Complaint  ?Patient presents with  ? Annual Exam  ?  Nonfasting annual exam with pelvic. No concerns.   ? ?Rachel Juarez is a 66 y.o. female who presents for a complete physical.  She underwent arthroscopic R knee surgery for torn medial meniscus on 02/21/22 by Dr. Rhona Raider. ?She is doing well. She is getting PT.  Swelling has improved, still has some pain (using advil prn). ?She takes '600mg'$  BID. She takes with food, denies abdominal pain.  She is worried about the safety of this.  She has noted some bruising, no bleeding. ? ?Hyperlipidemia:  Patient is compliant with taking rosuvastatin '10mg'$  daily, and is tolerating this without side effects. ?She continues to try and limit her cheese, eats red meat once a week, french fries 1-2x/week and no other fried foods, occasional eggs (only when out, <1 week). ? ?Lab Results  ?Component Value Date  ? CHOL 180 02/14/2022  ? HDL 84 02/14/2022  ? [place] 84 02/14/2022  ? TRIG 64 02/14/2022  ? CHOLHDL 2.1 02/14/2022  ? ?  ?Increased risk for breast cancer:  She is currently on '5mg'$  of tamoxifen, with plans to take it for 5 years. This is for prevention of breast cancer, under the care of Dr. Lindi Adie. She has f/u scheduled with him next week. She reports tolerating the medication without side effects---has mild hot flashes, no worse than she had prior to starting therapy.  She denies any breast concerns. She denies any vaginal bleeding or spotting. ?   ?H/o Vit D deficiency--Level was normal at 34.6 in 01/2022 when taking D only from her daily MVI. She was advised to take an additional 1000 IU of D3 October through March/April, and continue MVI daily. She is outside more now that she is back in the garden. Plans to start the D in the fall as suggested. ? ?Hypothyroidism/multinodular goiter: She is under the care of Dr. Chalmers Cater, sees her next week.  Labs were done here with recent visit, and forwarded to her. Denies any changes to hair/skin/bowels/nails/moods. ?Lab  Results  ?Component Value Date  ? TSH 0.869 02/14/2022  ?She underwent f/u thyroid US in 01/2022, which was stable: ?IMPRESSION: ?Stable exam of the thyroid gland with unchanged appearance of a ?small subcentimeter solitary nodule in the right thyroid lobe. No ?new nodules or other findings that warrant further dedicated ?follow-up or biopsy. ? ?Allergies: Year-round, with seasonal component in Spring/Fall. She is doing well on singulair, nasacort and allegra daily, along with OTC eye drops. Occasional sneeze, but not having itchy eyes currently. ?   ?Depression and Anxiety:  She continues on Citalopram '40mg'$  daily, and Buspar.  She stopped the Wellbutrin due to going on Tamoxifen.  She has been on Buspar for anxiety for about a year. She is taking '15mg'$  BID. ?Things are pretty good right now--gardening really helps her moods.  She has many things to look forward to (daughter's wedding, trip to [place] [employer] [employer] ?She continues to see therapist q2 weeks.  Husband is also now seeing a therapist, helping with relationship issues. ? ?Herpes labialis--uses Valtrex prn.   Last filled in 06/2021, #28. Medication is effective, still has some at home. Flares are infrequent, none in the last few months. ? ?Macrocytosis.  previously had B12 in the 330 range. MCV was at 100, prev up to 104 per last records.  Pt recalls seeing hematologist in the past.  No work-up was needed. She denies symptoms of anemia, denies  numbness, tingling or memory concerns. She takes MVI daily. She continues to have 2 alcoholic beverages daily. ?Lab Results  ?Component Value Date  ? WBC 3.9 02/14/2022  ? HGB 13.4 02/14/2022  ? HCT 39.8 02/14/2022  ? MCV 100 (H) 02/14/2022  ? PLT 249 02/14/2022  ? ? ? ?Immunization History  ?Administered Date(s) Administered  ? Influenza,inj,Quad PF,6+ Mos 08/30/2015, 08/21/2020, 08/29/2021  ? Influenza-Unspecified 07/30/2017, 08/24/2018, 08/20/2019  ? Moderna Covid-19 Vaccine Bivalent  Booster 4yr & up 08/15/2021  ? Moderna Sars-Covid-2 Vaccination 12/07/2019, 01/05/2020, 09/21/2020, 02/28/2021  ? Tdap 02/12/2011, 03/16/2021  ? Zoster Recombinat (Shingrix) 03/06/2018, 05/08/2018  ? ?Last Pap smear: 02/2021, normal, no high risk HPV detected ?Last mammogram:  01/2022, normal breast MRI 08/2021 ?Last colonoscopy: 05/2018 Dr. MWatt Climes hyperplastic polyps. Repeat 10 years. She did hemoccult testing sent to her by her insurance in 01/2021. ?Last DEXA: 07/2020 T-1.9 L fem neck (improved at R hip, spine and L hip stable). Dr. BChalmers Caterhas ordered a f/u DEXA, scheduled for next week. ?Dentist: twice a year ?Ophtho: Yearly (Dr. LGillian Scarce ?Exercise: PT 2x/week, personal trainer 2x/week, gardening. ?Not released for walking or yoga yet. ?  ?Hepatitis C screening--previously donated blood regularly ? ? ?PMH, PSH, SH and FH were reviewed and updated ?Negative genetic testing 10/2019 ? ?Outpatient Encounter Medications as of 03/13/2022  ?Medication Sig Note  ? busPIRone (BUSPAR) 15 MG tablet Take 1 tablet (15 mg total) by mouth 2 (two) times daily.   ? Calcium 250 MG CAPS Take 2 capsules by mouth daily. 01/26/2020: Takes 1 calcium tablet daily (unsure of dose)  ? citalopram (CELEXA) 40 MG tablet Take 1 tablet (40 mg total) by mouth daily.   ? fexofenadine (ALLEGRA) 180 MG tablet Take 180 mg by mouth daily.   ? levothyroxine (SYNTHROID, LEVOTHROID) 75 MCG tablet Take 75 mcg by mouth daily before breakfast.   ? Magnesium 500 MG TABS Take 1 tablet by mouth daily.   ? MILK THISTLE PO Take 1 capsule by mouth daily.   ? Misc Natural Products (TART CHERRY ADVANCED PO) Take 1 capsule by mouth daily.   ? montelukast (SINGULAIR) 10 MG tablet TAKE 1 TABLET BY MOUTH EVERYDAY AT BEDTIME   ? Multiple Vitamins-Minerals (MULTIVITAMIN WITH MINERALS) tablet Take 1 tablet by mouth daily.   ? NON FORMULARY Take 2 capsules by mouth daily. 01/26/2020: DoTerra On Guard  ? NON FORMULARY Take 2 capsules by mouth daily. 01/26/2020: DoTerra Zendocrine  ?  Omega-3 Fatty Acids (FISH OIL) 1000 MG CPDR Take 2,000 mg by mouth daily.   ? rosuvastatin (CRESTOR) 10 MG tablet TAKE 1 TABLET BY MOUTH EVERY DAY   ? tamoxifen (NOLVADEX) 10 MG tablet Take 0.5 tablets (5 mg total) by mouth daily.   ? Triamcinolone Acetonide (NASACORT AQ NA) Place 2 sprays into the nose daily.   ? vitamin C (ASCORBIC ACID) 500 MG tablet Take 500 mg by mouth daily.   ? ALPRAZolam (XANAX) 0.5 MG tablet TAKE 0.5-1 TABLETS (0.25-0.5 MG TOTAL) BY MOUTH 3 (THREE) TIMES DAILY AS NEEDED FOR ANXIETY OR SLEEP. (Patient not taking: Reported on 02/14/2022) 03/13/2022: prn  ? hydrocortisone 2.5 % ointment Apply topically 2 (two) times daily. (Patient not taking: Reported on 02/14/2022) 02/14/2022: prn  ? mupirocin ointment (BACTROBAN) 2 % Apply 1 application topically 2 (two) times daily. (Patient not taking: Reported on 02/14/2022) 03/13/2022: prn  ? valACYclovir (VALTREX) 1000 MG tablet TAKE 2 TABLETS AT ONSET OF COLD SORE. REPEAT ONCE IN 12 HOURS (4 PILLS/COURSE) (Patient not  taking: Reported on 02/14/2022) 02/14/2022: prn  ? ?No facility-administered encounter medications on file as of 03/13/2022.  ? ?Allergies  ?Allergen Reactions  ? Demerol [Meperidine] Other (See Comments)  ?  Low bp, passes out  ? ? ?ROS:  The patient denies, fever, weight changes, headaches,  vision changes, decreased hearing, ear pain, sore throat, breast concerns, chest pain, palpitations, dizziness, syncope, dyspnea on exertion, cough, swelling, nausea, vomiting, diarrhea, constipation, abdominal pain, melena, hematochezia, indigestion/heartburn, hematuria, incontinence, dysuria, vaginal bleeding, discharge, odor or itch, genital lesions, joint pains, numbness, tingling, weakness, tremor, suspicious skin lesions, abnormal bleeding or enlarged lymph nodes. ?Pain with intercourse, vaginal dryness. Mild, infrequent hot flashes. ?Allergies, controlled. ?Depression and anxiety stable. ?R knee pain, improving as expected. ?Saw Dr. Denna Haggard last month  (derm) ? ? ?PHYSICAL EXAM: ? ?BP 104/70   Pulse 80   Ht '5\' 6"'$  (1.676 m)   Wt 174 lb 6.4 oz (79.1 kg)   LMP 11/18/2008   BMI 28.15 kg/m?  ? ? ?Wt Readings from Last 3 Encounters:  ?03/13/22 174 lb 6.4 oz (79.

## 2022-03-13 ENCOUNTER — Encounter: Payer: Self-pay | Admitting: Family Medicine

## 2022-03-13 ENCOUNTER — Ambulatory Visit: Payer: 59 | Admitting: Family Medicine

## 2022-03-13 VITALS — BP 104/70 | HR 80 | Ht 66.0 in | Wt 174.4 lb

## 2022-03-13 DIAGNOSIS — E042 Nontoxic multinodular goiter: Secondary | ICD-10-CM

## 2022-03-13 DIAGNOSIS — F321 Major depressive disorder, single episode, moderate: Secondary | ICD-10-CM

## 2022-03-13 DIAGNOSIS — E039 Hypothyroidism, unspecified: Secondary | ICD-10-CM

## 2022-03-13 DIAGNOSIS — F411 Generalized anxiety disorder: Secondary | ICD-10-CM

## 2022-03-13 DIAGNOSIS — Z0000 Encounter for general adult medical examination without abnormal findings: Secondary | ICD-10-CM | POA: Diagnosis not present

## 2022-03-13 DIAGNOSIS — E7800 Pure hypercholesterolemia, unspecified: Secondary | ICD-10-CM

## 2022-03-13 DIAGNOSIS — E559 Vitamin D deficiency, unspecified: Secondary | ICD-10-CM

## 2022-03-13 DIAGNOSIS — Z9189 Other specified personal risk factors, not elsewhere classified: Secondary | ICD-10-CM

## 2022-03-13 DIAGNOSIS — J302 Other seasonal allergic rhinitis: Secondary | ICD-10-CM | POA: Diagnosis not present

## 2022-03-13 DIAGNOSIS — B001 Herpesviral vesicular dermatitis: Secondary | ICD-10-CM

## 2022-03-13 DIAGNOSIS — F3341 Major depressive disorder, recurrent, in partial remission: Secondary | ICD-10-CM

## 2022-03-13 LAB — POCT URINALYSIS DIP (PROADVANTAGE DEVICE)
Bilirubin, UA: NEGATIVE
Blood, UA: NEGATIVE
Glucose, UA: NEGATIVE mg/dL
Ketones, POC UA: NEGATIVE mg/dL
Leukocytes, UA: NEGATIVE
Nitrite, UA: NEGATIVE
Protein Ur, POC: NEGATIVE mg/dL
Specific Gravity, Urine: 1.02
Urobilinogen, Ur: NEGATIVE
pH, UA: 7 (ref 5.0–8.0)

## 2022-03-13 MED ORDER — BUSPIRONE HCL 15 MG PO TABS: 15.0000 mg | ORAL_TABLET | Freq: Two times a day (BID) | ORAL | 3 refills | Status: DC

## 2022-03-13 MED ORDER — CITALOPRAM HYDROBROMIDE 40 MG PO TABS: 40.0000 mg | ORAL_TABLET | Freq: Every day | ORAL | 3 refills | Status: DC

## 2022-03-19 LAB — HM DEXA SCAN

## 2022-03-21 ENCOUNTER — Inpatient Hospital Stay: Payer: 59 | Attending: Hematology and Oncology | Admitting: Hematology and Oncology

## 2022-03-21 DIAGNOSIS — Z9189 Other specified personal risk factors, not elsewhere classified: Secondary | ICD-10-CM

## 2022-03-21 NOTE — Assessment & Plan Note (Signed)
Genetic testing: Negative ?Age of menarche: 86 ?First live birth at age 66 ?Oral contraceptive pill use for about 30 years. ?No use of HRT. ?She is postmenopausal ?Family history: Breast cancer in maternal aunt was diagnosed in her mid 16s, maternal cousin diagnosed at age of 15 and a cousin's daughter who was diagnosed with breast cancer at the age of 65. ?Maternal grandmother had colon cancer in her 76s. ?Father had thymic cancer. ?Another paternal aunt had breast cancer at 51 and is currently living. ?? ?Tyrer-Cuzick risk: Lifetime risk: 24% ?Baker Janus risk model: 5-year risk: 4.7% ?? ?Breast cancer surveillance: ?1.  Breast MRI 08/26/2021: Benign, breast density category C ?2. mammogram 01/27/2021 [facility]: Benign breast density category D ?? ?Current treatment for risk reduction: Tamoxifen 5 mg daily starting 12/21/2021 ?Tamoxifen toxicities: ? ?Return to clinic in 1 year for follow-up ?

## 2022-03-29 ENCOUNTER — Encounter: Payer: Self-pay | Admitting: Family Medicine

## 2022-04-20 ENCOUNTER — Other Ambulatory Visit: Payer: Self-pay | Admitting: Family Medicine

## 2022-04-20 DIAGNOSIS — E7800 Pure hypercholesterolemia, unspecified: Secondary | ICD-10-CM

## 2022-04-20 DIAGNOSIS — J302 Other seasonal allergic rhinitis: Secondary | ICD-10-CM

## 2022-05-31 ENCOUNTER — Encounter: Payer: Self-pay | Admitting: Family Medicine

## 2022-07-10 ENCOUNTER — Encounter: Payer: Self-pay | Admitting: Family Medicine

## 2022-07-20 ENCOUNTER — Other Ambulatory Visit: Payer: Self-pay | Admitting: Family Medicine

## 2022-07-20 DIAGNOSIS — J302 Other seasonal allergic rhinitis: Secondary | ICD-10-CM

## 2022-07-20 DIAGNOSIS — E7800 Pure hypercholesterolemia, unspecified: Secondary | ICD-10-CM

## 2022-07-31 ENCOUNTER — Encounter: Payer: Self-pay | Admitting: Internal Medicine

## 2022-08-29 ENCOUNTER — Encounter: Payer: Self-pay | Admitting: Family Medicine

## 2022-08-29 DIAGNOSIS — F419 Anxiety disorder, unspecified: Secondary | ICD-10-CM

## 2022-08-29 MED ORDER — ALPRAZOLAM 0.5 MG PO TABS: 0.2500 mg | ORAL_TABLET | Freq: Three times a day (TID) | ORAL | 0 refills | Status: DC | PRN

## 2022-08-30 ENCOUNTER — Other Ambulatory Visit: Payer: Self-pay

## 2022-08-30 ENCOUNTER — Encounter: Payer: Self-pay | Admitting: Family Medicine

## 2022-08-30 ENCOUNTER — Ambulatory Visit: Payer: 59 | Admitting: Family Medicine

## 2022-08-30 VITALS — BP 110/70 | HR 84 | Temp 99.0°F | Ht 66.0 in | Wt 172.6 lb

## 2022-08-30 DIAGNOSIS — L509 Urticaria, unspecified: Secondary | ICD-10-CM

## 2022-08-30 MED ORDER — HYDROXYZINE HCL 10 MG PO TABS: 10.0000 mg | ORAL_TABLET | Freq: Three times a day (TID) | ORAL | 0 refills | Status: DC | PRN

## 2022-08-30 MED ORDER — PREDNISONE 20 MG PO TABS: 40.0000 mg | ORAL_TABLET | Freq: Every day | ORAL | 0 refills | Status: DC

## 2022-08-30 MED ORDER — METHYLPREDNISOLONE ACETATE 80 MG/ML IJ SUSP
80.0000 mg | Freq: Once | INTRAMUSCULAR | Status: AC
  Administered 2022-08-30: 80 mg via INTRAMUSCULAR

## 2022-08-30 NOTE — Progress Notes (Signed)
Chief Complaint  Patient presents with   Urticaria    Hives that started Wed, has taken benadryl, allegra and used triamcinolone cream. Nothing is helping. Hoping to get steroid shot. Her sports meds doctor called in pred pack and atarax but she has daughter's wedding this weekend and thinks shot might be better.    2 days ago she started with some itching/welts on her face, neck. She has a large patch at the left hip, smaller spots on L hand/thumb. Complaining of itchy head, L ear. No flare of allergies. No new contacts/exposures or food. Lots of stress related to upcoming wedding.  Benadryl helped her sleep last night, helped with the itching, but it was full blown again when she woke up this morning. Never took 2nd allegra  She reports she has had lots of steroid shots and prednisone in the past (related to poison ivy). None recent.  Has tolerating these without significant side effects in the past.  PMH, PSH, SH reviewed  Outpatient Encounter Medications as of 08/30/2022  Medication Sig Note   busPIRone (BUSPAR) 15 MG tablet Take 1 tablet (15 mg total) by mouth 2 (two) times daily.    Calcium 250 MG CAPS Take 2 capsules by mouth daily. 01/26/2020: Takes 1 calcium tablet daily (unsure of dose)   citalopram (CELEXA) 40 MG tablet Take 1 tablet (40 mg total) by mouth daily.    diphenhydrAMINE (BENADRYL) 25 MG tablet Take 25 mg by mouth every 6 (six) hours as needed. 08/30/2022: Last dose last night   fexofenadine (ALLEGRA) 180 MG tablet Take 180 mg by mouth daily.    levothyroxine (SYNTHROID, LEVOTHROID) 75 MCG tablet Take 75 mcg by mouth daily before breakfast.    Magnesium 500 MG TABS Take 1 tablet by mouth daily.    MILK THISTLE PO Take 1 capsule by mouth daily.    Misc Natural Products (TART CHERRY ADVANCED PO) Take 1 capsule by mouth daily.    montelukast (SINGULAIR) 10 MG tablet TAKE 1 TABLET BY MOUTH EVERYDAY AT BEDTIME    Multiple Vitamins-Minerals (MULTIVITAMIN WITH MINERALS)  tablet Take 1 tablet by mouth daily.    NON FORMULARY Take 2 capsules by mouth daily. 01/26/2020: DoTerra On Guard   NON FORMULARY Take 2 capsules by mouth daily. 01/26/2020: DoTerra Zendocrine   Omega-3 Fatty Acids (FISH OIL) 1000 MG CPDR Take 2,000 mg by mouth daily.    rosuvastatin (CRESTOR) 10 MG tablet TAKE 1 TABLET BY MOUTH EVERY DAY    tamoxifen (NOLVADEX) 10 MG tablet Take 0.5 tablets (5 mg total) by mouth daily.    Triamcinolone Acetonide (NASACORT AQ NA) Place 2 sprays into the nose daily.    triamcinolone cream (KENALOG) 0.5 % Apply topically 3 (three) times daily. 08/30/2022: Used last night   valACYclovir (VALTREX) 1000 MG tablet TAKE 2 TABLETS AT ONSET OF COLD SORE. REPEAT ONCE IN 12 HOURS (4 PILLS/COURSE) 08/30/2022: Took 2 this am   vitamin C (ASCORBIC ACID) 500 MG tablet Take 500 mg by mouth daily.    ALPRAZolam (XANAX) 0.5 MG tablet Take 0.5-1 tablets (0.25-0.5 mg total) by mouth 3 (three) times daily as needed for anxiety or sleep. (Patient not taking: Reported on 08/30/2022) 08/30/2022: prn   hydrocortisone 2.5 % ointment Apply topically 2 (two) times daily. (Patient not taking: Reported on 02/14/2022) 02/14/2022: prn   hydrOXYzine (ATARAX) 10 MG tablet Take 1 tablet (10 mg total) by mouth 3 (three) times daily as needed. (Patient not taking: Reported on 08/30/2022) 08/30/2022: Has not  yet taken   mupirocin ointment (BACTROBAN) 2 % Apply 1 application topically 2 (two) times daily. (Patient not taking: Reported on 02/14/2022) 03/13/2022: prn   predniSONE (DELTASONE) 20 MG tablet Take 2 tablets (40 mg total) by mouth daily with breakfast. (Patient not taking: Reported on 08/30/2022) 08/30/2022: Just called in by Dr.Smith   [EXPIRED] methylPREDNISolone acetate (DEPO-MEDROL) injection 80 mg     No facility-administered encounter medications on file as of 08/30/2022.   Allergies  Allergen Reactions   Demerol [Meperidine] Other (See Comments)    Low bp, passes out   ROS: no fever, chills, URI  symptoms.  No GI complaints.  No chest pain, shortness of breath. Itching and rash per HPI   PHYSICAL EXAM:  BP 110/70   Pulse 84   Temp 99 F (37.2 C) (Tympanic)   Ht '5\' 6"'$  (1.676 m)   Wt 172 lb 9.6 oz (78.3 kg)   LMP 11/19/2007   BMI 27.86 kg/m   Well dressed (about to go to [employer], dressed up), pleasant female in no distress HEENT: conjunctiva and sclera are clear, EOMI.  OP clear Neck: no lymphadenopathy.   Heart: regular rate and rhythm Lungs: clear bilaterally Skin: L hip--7cm area of erythema, slightly raised, irregularly shaped. Nontender. Urticaria at lateral neck (bilateral), R temple Smaller, more papular areas on the L thumb that are itchy L upper arm--fairly new abrasions (paper cut from cardboard box). Neuro: alert and oriented, cranial nerves intact, normal gait Psych: somewhat anxious/stressed, but full range of affect, normal hygiene and grooming.  ASSESSMENT/PLAN:  Urticaria - Risks/SE of steroids reviewed. Discussed OTC antihistamine use (allegra BID, benadryl at bedtime, if needed, vs atarax) - Plan: methylPREDNISolone acetate (DEPO-MEDROL) injection 80 mg  80 mg depo medrol

## 2022-08-30 NOTE — Telephone Encounter (Signed)
Pt is scheduled for today  °

## 2022-08-30 NOTE — Patient Instructions (Signed)
You were given '80mg'$  of depo-medrol today (steroid injection). You do not need to start the oral prednisone--you can start that if/when hives recur. You may take the allegra up to twice daily. You may continue to take benadryl at bedtime, if needed for sleep and/or itching/hives.  The atarax that was prescribed (by sports med) is similar to benadryl--take one OR the other, not both.

## 2022-09-02 ENCOUNTER — Encounter: Payer: Self-pay | Admitting: Hematology and Oncology

## 2022-09-02 ENCOUNTER — Other Ambulatory Visit: Payer: 59

## 2022-09-03 ENCOUNTER — Ambulatory Visit
Admission: RE | Admit: 2022-09-03 | Discharge: 2022-09-03 | Disposition: A | Discharge status: Home / Self Care | Payer: 59 | Source: Ambulatory Visit | Attending: Hematology and Oncology | Admitting: Hematology and Oncology

## 2022-09-03 DIAGNOSIS — Z9189 Other specified personal risk factors, not elsewhere classified: Secondary | ICD-10-CM

## 2022-09-03 MED ORDER — GADOBUTROL 1 MMOL/ML IV SOLN
8.0000 mL | Freq: Once | INTRAVENOUS | Status: AC | PRN
  Administered 2022-09-03: 8 mL via INTRAVENOUS

## 2022-09-11 ENCOUNTER — Encounter: Payer: Self-pay | Admitting: Hematology and Oncology

## 2022-10-03 ENCOUNTER — Encounter: Payer: Self-pay | Admitting: Family Medicine

## 2022-10-29 ENCOUNTER — Other Ambulatory Visit: Payer: Self-pay | Admitting: Family Medicine

## 2022-10-29 DIAGNOSIS — B001 Herpesviral vesicular dermatitis: Secondary | ICD-10-CM

## 2022-10-29 MED ORDER — VALACYCLOVIR HCL 1 G PO TABS: ORAL_TABLET | ORAL | 0 refills | Status: DC

## 2022-10-29 NOTE — Telephone Encounter (Signed)
Is this okay to refill? 

## 2022-11-26 ENCOUNTER — Encounter: Payer: Self-pay | Admitting: Family Medicine

## 2022-11-26 NOTE — Progress Notes (Signed)
Chief Complaint  Patient presents with   Anxiety    Has been having increased anxiety/depression over the last few months, since October.    Patient sent the following message: "I have been feeling very lethargic and down for the past month or so. I guess it could be attributed to life changes (only child getting married, turning 40, etc) but it is a bit concerning that there are days when I must force myself to get out of bed for the day. A lot of life feels like I must force it. I just feel dull. Since I had to go off of the buspar in order to take the tamoxifen, I'm wondering if there is a different drug I can take to help these symptoms?"  (Error in message--she stopped wellbutrin, not buspar)  Depression and Anxiety: At her physical in April 2023 she reported taking Citalopram 40mg  daily, and Buspar.  She stopped the Wellbutrin due to going on Tamoxifen.  She had been on Buspar for anxiety for about a year, taking 15mg  BID.  PHQ-9 and GAD-7 scores were 5 at that time. She had reported that gardening helped her moods, and she had a lot to look forward to (daughter's wedding, trip to [place] ([place] on the [employer]  She had been seen in October, just prior to the wedding, with hives. She reported that they ended up lasting for about 2 months, had to see her derm twice. She reports that she didn't like daughter's wedding, states it was a "miserable experience". Having her daughter get married is a big adjustment for her.  She states that her daughter would say nothing has changed (they still talk daily), but it feels different to her. She turns 67 next week, and is going to [place] next week for birthday--she dreads her birthday, but is looking forward to trip. She continues to see therapist q2 weeks.    "What do I have to look forward to". 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 She is still making herself do her activities. She forced herself to go to social gatherings over the holidays--does admit to finding some enjoyment in speaking to some of her friends.  She admits that she has a huge fear of dying, always has. She still has anxiety, reports having moments where she startles awake, in gripping fear, short-lived, lasting 10 seconds, is able to get back tosleep.  She reports she wakes up with headaches daily. She wears a mouthguard She reports fogginess and lethargy.  She feels rested in the morning, not like she hasn't slept. Denies waking up at night.   She states that her husband says she sleeps, but also notes some snoring and "snorting sounds". In discussing her symptoms (HA, fogginess, lethargy, snorting per husband), mentioned sleep apnea-- Patient interrupted "I'm not going to do a sleep study". Feels like it is all related to her mood and not interested in discussing other potential factors, wont' do a sleep study.   PMH, PSH, SH reviewed  Outpatient Encounter Medications as of 11/28/2022  Medication Sig Note   busPIRone (BUSPAR) 15 MG tablet Take 1 tablet (15 mg total) by mouth 2 (two) times daily.    Calcium 250 MG CAPS Take 2 capsules by mouth daily. 01/26/2020: Takes 1 calcium tablet daily (unsure of dose)   citalopram (CELEXA) 40 MG tablet Take 1 tablet (40 mg total)  by mouth daily.    levothyroxine (SYNTHROID, LEVOTHROID) 75 MCG tablet Take 75 mcg by mouth daily before breakfast.    Loratadine (CLARITIN PO) Take 1 tablet by mouth daily.    Magnesium 500 MG TABS Take 1 tablet by mouth daily.    MILK THISTLE PO Take 1 capsule by mouth daily.    Misc Natural Products (TART CHERRY ADVANCED PO) Take 1 capsule by mouth daily.    montelukast (SINGULAIR) 10 MG tablet TAKE 1 TABLET BY MOUTH EVERYDAY AT BEDTIME    Multiple Vitamins-Minerals (MULTIVITAMIN WITH MINERALS) tablet Take 1 tablet by mouth daily.    NON FORMULARY Take 2 capsules by mouth  daily. 01/26/2020: DoTerra On Guard   NON FORMULARY Take 2 capsules by mouth daily. 01/26/2020: DoTerra Zendocrine   Omega-3 Fatty Acids (FISH OIL) 1000 MG CPDR Take 2,000 mg by mouth daily.    rosuvastatin (CRESTOR) 10 MG tablet TAKE 1 TABLET BY MOUTH EVERY DAY    tamoxifen (NOLVADEX) 10 MG tablet Take 0.5 tablets (5 mg total) by mouth daily.    Triamcinolone Acetonide (NASACORT AQ NA) Place 2 sprays into the nose daily.    vitamin C (ASCORBIC ACID) 500 MG tablet Take 500 mg by mouth daily.    [DISCONTINUED] fexofenadine (ALLEGRA) 180 MG tablet Take 180 mg by mouth daily.    ALPRAZolam (XANAX) 0.5 MG tablet Take 0.5-1 tablets (0.25-0.5 mg total) by mouth 3 (three) times daily as needed for anxiety or sleep. (Patient not taking: Reported on 08/30/2022) 11/28/2022: Uses for flying, before mammogram/breast MRIs   diphenhydrAMINE (BENADRYL) 25 MG tablet Take 25 mg by mouth every 6 (six) hours as needed. (Patient not taking: Reported on 11/28/2022) 11/28/2022: prn   hydrocortisone 2.5 % ointment Apply topically 2 (two) times daily. (Patient not taking: Reported on 02/14/2022) 02/14/2022: prn   hydrOXYzine (ATARAX) 10 MG tablet Take 1 tablet (10 mg total) by mouth 3 (three) times daily as needed. (Patient not taking: Reported on 08/30/2022) 11/28/2022: prn   mupirocin ointment (BACTROBAN) 2 % Apply 1 application topically 2 (two) times daily. (Patient not taking: Reported on 02/14/2022) 03/13/2022: prn   triamcinolone cream (KENALOG) 0.5 % Apply topically 3 (three) times daily. (Patient not taking: Reported on 11/28/2022) 11/28/2022: prn   valACYclovir (VALTREX) 1000 MG tablet TAKE 2 TABLETS AT ONSET OF COLD SORE. REPEAT ONCE IN 12 HOURS (4 PILLS/COURSE) (Patient not taking: Reported on 11/28/2022) 11/28/2022: prn   [DISCONTINUED] predniSONE (DELTASONE) 20 MG tablet Take 2 tablets (40 mg total) by mouth daily with breakfast. (Patient not taking: Reported on 08/30/2022) 08/30/2022: Just called in by Dr.Smith   No  facility-administered encounter medications on file as of 11/28/2022.   Allergies  Allergen Reactions   Demerol [Meperidine] Other (See Comments)    Low bp, passes out   ROS:  depression/anxiety/snoring/morning headaches/lethargy/fogginess per HPI. No URI symptoms, GI complaints. No further rashes.   See HPI   PHYSICAL EXAM:  BP 120/70   Pulse 68   Ht 5\' 6"  (1.676 m)   Wt 175 lb (79.4 kg)   LMP 11/19/2007   BMI 28.25 kg/m   Wt Readings from Last 3 Encounters:  11/28/22 175 lb (79.4 kg)  08/30/22 172 lb 9.6 oz (78.3 kg)  03/13/22 174 lb 6.4 oz (79.1 kg)   Well-appearing female, wearing mask. She is alert and oriented, and appears comfortable. Normal eye contact, speech. Affect is somewhat flat, mood is depressed. She became somewhat irritable (when discussing potential OSA symptoms). Normal hygiene and grooming.  11/28/2022    1:27 PM 03/13/2022    9:52 AM 06/06/2021    3:12 PM 03/05/2021   10:29 AM 03/05/2021    9:40 AM  Depression screen PHQ 2/9  Decreased Interest 2 1 2  0 0  Down, Depressed, Hopeless 2 1 1  0 0  PHQ - 2 Score 4 2 3  0 0  Altered sleeping 1 1 0 3   Tired, decreased energy 2 1 1  0   Change in appetite 2 0 0 0   Feeling bad or failure about yourself  2 1 3  0   Trouble concentrating 3 0 2 3   Moving slowly or fidgety/restless 0 0 0 0   Suicidal thoughts 0 0 0 0   PHQ-9 Score 14 5 9 6    Difficult doing work/chores Somewhat difficult Not difficult at all         11/28/2022    1:27 PM 03/13/2022    9:46 AM 06/06/2021    3:14 PM 03/05/2021   10:32 AM  GAD 7 : Generalized Anxiety Score  Nervous, Anxious, on Edge 1 1 3 3   Control/stop worrying 1 0 3 1  Worry too much - different things 1 3 1 3   Trouble relaxing 0 0 1 2  Restless 0 0 0 0  Easily annoyed or irritable 3 1 3 1   Afraid - awful might happen 3 0 3 3  Total GAD 7 Score 9 5 14 13   Anxiety Difficulty Somewhat difficult Not difficult at all      ASSESSMENT/PLAN:  Depression, major, single  episode, moderate (HCC) - worsening dep/anxiety despite ongoing counseling. Switch from citalopram to SNRI (Pristiq, switch to effexor if not covered; no interaction with tamoxifen) - Plan: desvenlafaxine (PRISTIQ) 50 MG 24 hr tablet, desvenlafaxine (PRISTIQ) 25 MG 24 hr tablet, citalopram (CELEXA) 40 MG tablet  Generalized anxiety disorder - continue buspar.  taper off citalopram and onto pristiq (change to effexor if needed for cost). Continue counseling - Plan: desvenlafaxine (PRISTIQ) 50 MG 24 hr tablet, desvenlafaxine (PRISTIQ) 25 MG 24 hr tablet, citalopram (CELEXA) 40 MG tablet  I spent 65 minutes dedicated to the care of this patient, including pre-visit review of records, face to face time, post-visit ordering of testing and documentation. Most of the visit was spent counseling--risks/SE of SNRI's, verified no interaction with tamoxifen for either pristiq or effexor. Advised how to taper off citalopram and ramp up the pristiq. We discussed finding things that bring her joy, having plans for the next 20-30 years of her life, setting goals. We discussed her morning headaches briefly--she states she wears a biteguard. Advised that protects her teeth, but if clenching/grinding, can still cause headaches. Also discussed her snoring/snorting, fogginess/fatigue, and discussed the overlap with OSA sx. She declines sleep study. Advised to reconsider this in the future, after moods improve (hopefully) with med changes, if these symptoms still persist.  F/u 6 weeks

## 2022-11-28 ENCOUNTER — Ambulatory Visit (INDEPENDENT_AMBULATORY_CARE_PROVIDER_SITE_OTHER): Payer: Medicare Other | Admitting: Family Medicine

## 2022-11-28 ENCOUNTER — Encounter: Payer: Self-pay | Admitting: Family Medicine

## 2022-11-28 VITALS — BP 120/70 | HR 68 | Ht 66.0 in | Wt 175.0 lb

## 2022-11-28 DIAGNOSIS — F321 Major depressive disorder, single episode, moderate: Secondary | ICD-10-CM

## 2022-11-28 DIAGNOSIS — F411 Generalized anxiety disorder: Secondary | ICD-10-CM | POA: Diagnosis not present

## 2022-11-28 MED ORDER — DESVENLAFAXINE SUCCINATE ER 25 MG PO TB24: 25.0000 mg | ORAL_TABLET | Freq: Every day | ORAL | 0 refills | Status: DC

## 2022-11-28 MED ORDER — CITALOPRAM HYDROBROMIDE 40 MG PO TABS: 20.0000 mg | ORAL_TABLET | Freq: Every day | ORAL | Status: DC

## 2022-11-28 MED ORDER — DESVENLAFAXINE SUCCINATE ER 50 MG PO TB24: 50.0000 mg | ORAL_TABLET | Freq: Every day | ORAL | 1 refills | Status: DC

## 2022-11-28 NOTE — Patient Instructions (Signed)
Continue the buspar at 15 mg twice daily. If you develop any symptoms concerning for serotnin syndrome (see handout), then decrease the dose of buspar.  We are going to gradually switch you from the citalopram to Pristiq. The pristiq offers additional neurotransmitter changes (serotonin and norepinephrine), which hopefully will be more effective in treating your depression.  Once you fill the '25mg'$  dose of Pristiq, you can start taking that once daily in the morning, and cut the citalopram dose to 1/2 tablet ('20mg'$  dose). Take these together for 7-10 days (longer if you're having some tolerability issues). Then increase the Pristiq to 2 tablets ('50mg'$ ), and then stop the citalopram. Stay on the '50mg'$  for 4-6 weeks and follow up afterwards. There is a '50mg'$  prescription at your pharmacy to pick up once you finish the '25mg'$ .  If there is an issue with insurance coverage for Pristiq, we will switch to effexor (venlafaxine), and I'll type of new directions.

## 2022-11-29 ENCOUNTER — Encounter: Payer: Self-pay | Admitting: Family Medicine

## 2022-11-29 DIAGNOSIS — F321 Major depressive disorder, single episode, moderate: Secondary | ICD-10-CM

## 2022-11-29 DIAGNOSIS — F411 Generalized anxiety disorder: Secondary | ICD-10-CM

## 2022-11-30 MED ORDER — VENLAFAXINE HCL ER 37.5 MG PO CP24: ORAL_CAPSULE | ORAL | 0 refills | Status: DC

## 2022-11-30 MED ORDER — VENLAFAXINE HCL ER 75 MG PO CP24: 75.0000 mg | ORAL_CAPSULE | Freq: Every day | ORAL | 0 refills | Status: DC

## 2022-12-22 NOTE — Assessment & Plan Note (Signed)
Genetic testing: Negative Age of menarche: 89 First live birth at age 67 Oral contraceptive pill use for about 30 years.  No use of HRT.  She is postmenopausal Family history: Breast cancer in maternal aunt was diagnosed in her mid 35s, maternal cousin diagnosed at age of 59 and a cousin's daughter who was diagnosed with breast cancer at the age of 20.  Maternal grandmother had colon cancer in her 40s.  Father had thymic cancer.  Another paternal aunt had breast cancer at 18 and is currently living.   Tyrer-Cuzick risk: Lifetime risk: 24% Gail risk model: 5-year risk: 4.7%   Breast cancer surveillance: 1.  Breast MRI 09/03/2022: Benign, breast density category C.  Will obtain MRI in October 2024.  After this we can talk about doing MRIs every other year. 2. mammogram 01/27/2021 [facility]: Benign breast density category D   Current treatment for risk reduction: Tamoxifen 5 mg daily starting 12/21/2021 Tamoxifen toxicities: No side effects Mild Hot flashes (same as before) Mild muscle cramps  Denies any vaginal bleeding or spotting.   Return to clinic in 1 year for follow-up

## 2022-12-23 ENCOUNTER — Inpatient Hospital Stay: Payer: 59 | Attending: Hematology and Oncology | Admitting: Hematology and Oncology

## 2022-12-23 ENCOUNTER — Other Ambulatory Visit: Payer: Self-pay

## 2022-12-23 VITALS — BP 118/81 | HR 92 | Temp 97.5°F | Resp 17 | Wt 172.9 lb

## 2022-12-23 DIAGNOSIS — Z9189 Other specified personal risk factors, not elsewhere classified: Secondary | ICD-10-CM | POA: Diagnosis not present

## 2022-12-23 DIAGNOSIS — Z79810 Long term (current) use of selective estrogen receptor modulators (SERMs): Secondary | ICD-10-CM | POA: Insufficient documentation

## 2022-12-23 NOTE — Progress Notes (Signed)
Patient Care Team: Rita Ohara, MD as PCP - General (Family Medicine)  DIAGNOSIS:  Encounter Diagnosis  Name Primary?   At high risk for breast cancer Yes     CHIEF COMPLIANT: Follow-up High risk Tamoxifen  INTERVAL HISTORY: Rachel Juarez is a 67 y.o. with above-mentioned history of increased life time risk of breast cancer. Currently on Tamoxifen. She presents to the clinic today for follow-up. She reports mild and manageable hot flashes, and some joint stiffness. She denies any spotting but some cramping. She denies any pain or discomfort in breast. Overall she is tolerating the tamoxifen extremely well.   ALLERGIES:  is allergic to demerol [meperidine].  MEDICATIONS:  Current Outpatient Medications  Medication Sig Dispense Refill   busPIRone (BUSPAR) 15 MG tablet Take 1 tablet (15 mg total) by mouth 2 (two) times daily. 180 tablet 3   Calcium 250 MG CAPS Take 2 capsules by mouth daily.     citalopram (CELEXA) 40 MG tablet Take 40 mg by mouth daily.     levothyroxine (SYNTHROID, LEVOTHROID) 75 MCG tablet Take 75 mcg by mouth daily before breakfast.     Loratadine (CLARITIN PO) Take 1 tablet by mouth daily.     Magnesium 500 MG TABS Take 1 tablet by mouth daily.     MILK THISTLE PO Take 1 capsule by mouth daily.     Misc Natural Products (TART CHERRY ADVANCED PO) Take 1 capsule by mouth daily.     montelukast (SINGULAIR) 10 MG tablet TAKE 1 TABLET BY MOUTH EVERYDAY AT BEDTIME 90 tablet 1   montelukast (SINGULAIR) 4 MG chewable tablet Montelukast Sodium     Multiple Vitamins-Minerals (MULTIVITAMIN WITH MINERALS) tablet Take 1 tablet by mouth daily.     NON FORMULARY Take 2 capsules by mouth daily.     NON FORMULARY Take 2 capsules by mouth daily.     Omega-3 Fatty Acids (FISH OIL) 1000 MG CPDR Take 2,000 mg by mouth daily.     rosuvastatin (CRESTOR) 10 MG tablet TAKE 1 TABLET BY MOUTH EVERY DAY 90 tablet 1   tamoxifen (NOLVADEX) 10 MG tablet Take 0.5 tablets (5 mg total)  by mouth daily. 60 tablet 3   Triamcinolone Acetonide (NASACORT AQ NA) Place 2 sprays into the nose daily.     venlafaxine XR (EFFEXOR XR) 37.5 MG 24 hr capsule Take 1 capsule by mouth daily.  Increase to 2 capsules after 1 week, or when tolerated 30 capsule 0   venlafaxine XR (EFFEXOR XR) 75 MG 24 hr capsule Take 1 capsule (75 mg total) by mouth daily with breakfast. 90 capsule 0   vitamin C (ASCORBIC ACID) 500 MG tablet Take 500 mg by mouth daily.     ALPRAZolam (XANAX) 0.5 MG tablet Take 0.5-1 tablets (0.25-0.5 mg total) by mouth 3 (three) times daily as needed for anxiety or sleep. (Patient not taking: Reported on 08/30/2022) 15 tablet 0   diphenhydrAMINE (BENADRYL) 25 MG tablet Take 25 mg by mouth every 6 (six) hours as needed. (Patient not taking: Reported on 11/28/2022)     hydrocortisone 2.5 % ointment Apply topically 2 (two) times daily. (Patient not taking: Reported on 02/14/2022) 30 g 0   hydrOXYzine (ATARAX) 10 MG tablet Take 1 tablet (10 mg total) by mouth 3 (three) times daily as needed. (Patient not taking: Reported on 08/30/2022) 30 tablet 0   mupirocin ointment (BACTROBAN) 2 % Apply 1 application topically 2 (two) times daily. (Patient not taking: Reported on 02/14/2022) 22 g  0   triamcinolone cream (KENALOG) 0.5 % Apply topically 3 (three) times daily. (Patient not taking: Reported on 11/28/2022)     valACYclovir (VALTREX) 1000 MG tablet TAKE 2 TABLETS AT ONSET OF COLD SORE. REPEAT ONCE IN 12 HOURS (4 PILLS/COURSE) (Patient not taking: Reported on 11/28/2022) 28 tablet 0   No current facility-administered medications for this visit.    PHYSICAL EXAMINATION: ECOG PERFORMANCE STATUS: 1 - Symptomatic but completely ambulatory  Vitals:   12/23/22 0954  BP: 118/81  Pulse: 92  Resp: 17  Temp: (!) 97.5 F (36.4 C)  SpO2: 96%   Filed Weights   12/23/22 0954  Weight: 172 lb 14.4 oz (78.4 kg)    BREAST: No palpable masses or nodules in either right or left breasts. No palpable axillary  supraclavicular or infraclavicular adenopathy no breast tenderness or nipple discharge. (exam performed in the presence of a chaperone)  LABORATORY DATA:  I have reviewed the data as listed    Latest Ref Rng & Units 02/14/2022   11:27 AM 01/10/2021   11:36 AM 10/04/2020    8:50 AM  CMP  Glucose 70 - 99 mg/dL 97  89    BUN 8 - 27 mg/dL 16  13    Creatinine 0.57 - 1.00 mg/dL 0.82  0.80    Sodium 134 - 144 mmol/L 141  139    Potassium 3.5 - 5.2 mmol/L 5.3  4.8    Chloride 96 - 106 mmol/L 104  103    CO2 20 - 29 mmol/L 25  28    Calcium 8.7 - 10.3 mg/dL 9.9  9.5    9.5    Total Protein 6.0 - 8.5 g/dL 6.7  6.8  6.8   Total Bilirubin 0.0 - 1.2 mg/dL 0.4  0.5  0.4   Alkaline Phos 44 - 121 IU/L 63  70  86   AST 0 - 40 IU/L '20  21  25   '$ ALT 0 - 32 IU/L 27  29  37     Lab Results  Component Value Date   WBC 3.9 02/14/2022   HGB 13.4 02/14/2022   HCT 39.8 02/14/2022   MCV 100 (H) 02/14/2022   PLT 249 02/14/2022   NEUTROABS 2.0 02/14/2022    ASSESSMENT & PLAN:  At high risk for breast cancer Genetic testing: Negative Age of menarche: 56 First live birth at age 5 Oral contraceptive pill use for about 30 years.  No use of HRT.  She is postmenopausal Family history: Breast cancer in maternal aunt was diagnosed in her mid 45s, maternal cousin diagnosed at age of 63 and a cousin's daughter who was diagnosed with breast cancer at the age of 58.  Maternal grandmother had colon cancer in her 98s.  Father had thymic cancer.  Another paternal aunt had breast cancer at 26 and is currently living.   Tyrer-Cuzick risk: Lifetime risk: 24% Gail risk model: 5-year risk: 4.7%   Breast cancer surveillance: 1.  Breast MRI 09/03/2022: Benign, breast density category C.  Will obtain MRI in October 2024.  After this we can talk about doing MRIs every other year. 2. mammogram 01/27/2021 [facility]: Benign breast density category D   Current treatment for risk reduction: Tamoxifen 5 mg daily starting  12/21/2021 Tamoxifen toxicities: No side effects Mild Hot flashes (same as before) Mild muscle cramps  Denies any vaginal bleeding or spotting.   Return to clinic in 1 year for follow-up    Orders Placed This Encounter  Procedures   MR BREAST BILATERAL W WO CONTRAST INC CAD    27% life time risk    Standing Status:   Future    Standing Expiration Date:   12/24/2023    Order Specific Question:   If indicated for the ordered procedure, I authorize the administration of contrast media per Radiology protocol    Answer:   Yes    Order Specific Question:   What is the patient's sedation requirement?    Answer:   No Sedation    Order Specific Question:   Does the patient have a pacemaker or implanted devices?    Answer:   No    Order Specific Question:   Preferred imaging location?    Answer:   [facility] (table limit-550lbs)   The patient has a good understanding of the overall plan. she agrees with it. she will call with any problems that may develop before the next visit here. Total time spent: 30 mins including face to face time and time spent for planning, charting and co-ordination of care   Harriette Ohara, MD 12/23/22    I Gardiner Coins am acting as a Education administrator for [employer]  I have reviewed the above documentation for accuracy and completeness, and I agree with the above.

## 2022-12-24 ENCOUNTER — Other Ambulatory Visit: Payer: Self-pay | Admitting: *Deleted

## 2022-12-24 MED ORDER — TAMOXIFEN CITRATE 10 MG PO TABS: 5.0000 mg | ORAL_TABLET | Freq: Every day | ORAL | 3 refills | Status: DC

## 2023-01-01 ENCOUNTER — Encounter: Payer: Self-pay | Admitting: Family Medicine

## 2023-01-13 ENCOUNTER — Encounter: Payer: Medicare Other | Admitting: Family Medicine

## 2023-01-15 ENCOUNTER — Other Ambulatory Visit: Payer: Self-pay | Admitting: Family Medicine

## 2023-01-15 DIAGNOSIS — J302 Other seasonal allergic rhinitis: Secondary | ICD-10-CM

## 2023-01-15 DIAGNOSIS — E7800 Pure hypercholesterolemia, unspecified: Secondary | ICD-10-CM

## 2023-01-17 NOTE — Progress Notes (Unsigned)
Rachel Juarez [address] [place] Phone: [phone] Subjective:   Rachel Juarez, am serving as a scribe for Dr. Hulan Saas.  I'm seeing this patient by the request  of:  Rita Ohara, MD  CC: Right knee pain  [id number]  01/31/2022 Patient has a large meniscal tear noted of the posterior horn of the medial meniscus.  I do believe the patient has fairly significant extrusion of the body of the meniscus causing impingement.  Also moderate tricompartmental osteoarthritic changes also noted.  Patient does have a fairly large moderate Baker's cyst.  Due to all of these I do not think that conservative therapy would be beneficial especially secondary to the instability.  Patient has been referred to orthopedic surgery and I do think patient is likely a candidate for possible arthroscopic procedure.  Depending on this we did discuss with her that likely will need replacement in the future but hopefully not at this juncture.  Patient is willing to have the surgery if this would allow her to get back to an improved quality of life.  Patient knows we are here for any other questions.  Total time reviewing MRIs as well as discussing with patient greater than 31 minutes     Update 01/20/2023 Rachel Juarez is a 67 y.o. female coming in with complaint of R knee pain. Patient states doing okay. Might want injections. No other concerns      Past Medical History:  Diagnosis Date   Allergy    Anxiety    Depression, major, in remission ([place]) 2006   when daughter dx'd with autoimmune disorder   Family history of breast cancer    Family history of cancer tonsil    Family history of thyroid cancer    Generalized anxiety disorder    generalized, and related to mammograms and flying   Glaucoma suspect    sees Dr. Katy Fitch, has all been okay, sees him qoyr   Herpes labialis    Hypercholesterolemia    high HDL, borderline LDL   Hypothyroidism     treated by Dr. Chalmers Cater   Multinodular goiter    followed by Dr. 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 Gillian Scarce   KNEE ARTHROSCOPY Right 02/21/2022   for torn medial meniscus (Dr. Rhona Raider)   Social History   Socioeconomic History   Marital status: Married    Spouse name: Not on file   Number of children: Not on file   Years of education: Not on file   Highest education level: Not on file  Occupational History   Occupation: yoga teacher  Tobacco Use   Smoking status: Never   Smokeless tobacco: Never  Vaping Use   Vaping Use: Never used  Substance and Sexual Activity   Alcohol use: Yes    Alcohol/week: 0.0 standard drinks of alcohol    Comment: 2 Vodka sodas/day   Drug use: No   Sexual activity: Yes    Partners: Male    Birth control/protection: Post-menopausal  Other Topics Concern   Not on file  Social History Narrative   Pre-COVID, taught yoga at multiple places.    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 1 dog (Malti-poo)  Lives with her husband, and she has a daughter--graduated from [employer], lives in [place], in [place] at [employer] (graduating [employer] 2023)     Engaged to be married.    Volunteers at the [employer]      Updated 02/2022   Social Determinants of Health   Financial Resource Strain: Not on file  Food Insecurity: Not on file  Transportation Needs: Not on file  Physical Activity: Not on file  Stress: Not on file  Social Connections: Not on file   Allergies  Allergen Reactions   Demerol [Meperidine] Other (See Comments)    Low bp, passes out   Family History  Problem Relation Age of Onset   Lupus Mother    Cancer Father 37       thymic cancer   COPD Father    Heart disease Father        CHF   Asthma Father     Cancer Sister    Breast cancer Sister 38       "best kind", lumpectomy and radiation   Dermatomyositis Daughter 67       juvenile form   Irritable bowel syndrome Daughter    Colon cancer Maternal Grandmother        80's   Cancer Maternal Grandmother    Hypertension Paternal Grandmother    Hyperlipidemia Paternal Grandmother    Thyroid disease Paternal Grandmother        had goiter removed   Stroke Paternal Grandmother 68   Hypertension Paternal Grandfather    Hyperlipidemia Paternal Grandfather    Heart disease Paternal Grandfather        MI first in his 86's   Stroke Paternal Grandfather    Cancer Paternal Uncle        started on tonsils (smoker)   Breast cancer Paternal Aunt 34   Breast cancer Cousin 19   Breast cancer Cousin 64       (the mother of the 57 y/o cousin with br ca   Thyroid cancer Cousin    Diabetes Neg Hx    Ovarian cancer Neg Hx     Current Outpatient Medications (Endocrine & Metabolic):    levothyroxine (SYNTHROID, LEVOTHROID) 75 MCG tablet, Take 75 mcg by mouth daily before breakfast.  Current Outpatient Medications (Cardiovascular):    rosuvastatin (CRESTOR) 10 MG tablet, TAKE 1 TABLET BY MOUTH EVERY DAY  Current Outpatient Medications (Respiratory):    diphenhydrAMINE (BENADRYL) 25 MG tablet, Take 25 mg by mouth every 6 (six) hours as needed. (Patient not taking: Reported on 11/28/2022)   Loratadine (CLARITIN PO), Take 1 tablet by mouth daily.   montelukast (SINGULAIR) 10 MG tablet, TAKE 1 TABLET BY MOUTH EVERYDAY AT BEDTIME   montelukast (SINGULAIR) 4 MG chewable tablet, Montelukast Sodium   Triamcinolone Acetonide (NASACORT AQ NA), Place 2 sprays into the nose daily.    Current Outpatient Medications (Other):    ALPRAZolam (XANAX) 0.5 MG tablet, Take 0.5-1 tablets (0.25-0.5 mg total) by mouth 3 (three) times daily as needed for anxiety or sleep. (Patient not taking: Reported on 08/30/2022)   busPIRone (BUSPAR) 15 MG tablet, Take 1 tablet (15 mg  total) by mouth 2 (two) times daily.   Calcium 250 MG CAPS, Take 2 capsules by mouth daily.   citalopram (CELEXA) 40 MG tablet, Take 40 mg by mouth daily.   hydrocortisone 2.5 % ointment, Apply topically 2 (two) times daily. (Patient not taking: Reported on 02/14/2022)   hydrOXYzine (ATARAX) 10 MG tablet, Take 1  tablet (10 mg total) by mouth 3 (three) times daily as needed. (Patient not taking: Reported on 08/30/2022)   Magnesium 500 MG TABS, Take 1 tablet by mouth daily.   MILK THISTLE PO, Take 1 capsule by mouth daily.   Misc Natural Products (TART CHERRY ADVANCED PO), Take 1 capsule by mouth daily.   Multiple Vitamins-Minerals (MULTIVITAMIN WITH MINERALS) tablet, Take 1 tablet by mouth daily.   mupirocin ointment (BACTROBAN) 2 %, Apply 1 application topically 2 (two) times daily. (Patient not taking: Reported on 02/14/2022)   NON FORMULARY, Take 2 capsules by mouth daily.   NON FORMULARY, Take 2 capsules by mouth daily.   Omega-3 Fatty Acids (FISH OIL) 1000 MG CPDR, Take 2,000 mg by mouth daily.   tamoxifen (NOLVADEX) 10 MG tablet, Take 0.5 tablets (5 mg total) by mouth daily.   triamcinolone cream (KENALOG) 0.5 %, Apply topically 3 (three) times daily. (Patient not taking: Reported on 11/28/2022)   valACYclovir (VALTREX) 1000 MG tablet, TAKE 2 TABLETS AT ONSET OF COLD SORE. REPEAT ONCE IN 12 HOURS (4 PILLS/COURSE) (Patient not taking: Reported on 11/28/2022)   venlafaxine XR (EFFEXOR XR) 37.5 MG 24 hr capsule, Take 1 capsule by mouth daily.  Increase to 2 capsules after 1 week, or when tolerated   venlafaxine XR (EFFEXOR XR) 75 MG 24 hr capsule, Take 1 capsule (75 mg total) by mouth daily with breakfast.   vitamin C (ASCORBIC ACID) 500 MG tablet, Take 500 mg by mouth daily.   Reviewed prior external information including notes and imaging from  primary care provider As well as notes that were available from care everywhere and other healthcare systems.  Past medical history, social, surgical  and family history all reviewed in electronic medical record.  No pertanent information unless stated regarding to the chief complaint.   Review of Systems:  No headache, visual changes, nausea, vomiting, diarrhea, constipation, dizziness, abdominal pain, skin rash, fevers, chills, night sweats, weight loss, swollen lymph nodes, body aches, joint swelling, chest pain, shortness of breath, mood changes. POSITIVE muscle aches  Objective  Blood pressure 110/68, pulse 92, height '5\' 6"'$  (1.676 m), weight 179 lb (81.2 kg), last menstrual period 11/19/2007, SpO2 97 %.   General: No apparent distress alert and oriented x3 mood and affect normal, dressed appropriately.  HEENT: Pupils equal, extraocular movements intact  Respiratory: Patient's speak in full sentences and does not appear short of breath  Cardiovascular: No lower extremity edema, non tender, no erythema  Right knee exam shows the patient does have some crepitus noted.  Patient does have some lateral tracking noted.  No has small effusion of the patellofemoral joint.   After informed written and verbal consent, patient was seated on exam table. Right knee was prepped with alcohol swab and utilizing anterolateral approach, patient's right knee space was injected with 4:1  marcaine 0.5%: Kenalog '40mg'$ /dL. Patient tolerated the procedure well without immediate complications.     Impression and Recommendations:     The above documentation has been reviewed and is accurate and complete Lyndal Pulley, DO

## 2023-01-20 ENCOUNTER — Ambulatory Visit (INDEPENDENT_AMBULATORY_CARE_PROVIDER_SITE_OTHER): Payer: 59 | Admitting: Family Medicine

## 2023-01-20 ENCOUNTER — Encounter: Payer: Self-pay | Admitting: Family Medicine

## 2023-01-20 VITALS — BP 110/68 | HR 92 | Ht 66.0 in | Wt 179.0 lb

## 2023-01-20 DIAGNOSIS — M25561 Pain in right knee: Secondary | ICD-10-CM | POA: Diagnosis not present

## 2023-01-20 DIAGNOSIS — G8929 Other chronic pain: Secondary | ICD-10-CM | POA: Diagnosis not present

## 2023-01-20 NOTE — Patient Instructions (Signed)
Injected knee today See me in 6 weeks for gel if needed

## 2023-01-20 NOTE — Assessment & Plan Note (Signed)
Intermittent pain that seems to be more in the patellofemoral and does have some underlying medial joint space narrowing as well.  Does respond relatively well though to the conservative therapy but at this point with her not making significant improvements we did do an injection today.  Hopeful that this will make significant improvement long-term.  Discussed icing regimen and home exercises, discussed which activities should be potentially avoided initially.  Concerned that some of this could be secondary to the range of motion of her ankle as well that we will need to continue to monitor.  Discussed proper shoe choices.  Follow-up again in 6 to 8 weeks.

## 2023-02-04 ENCOUNTER — Ambulatory Visit: Payer: 59 | Admitting: Dermatology

## 2023-02-11 NOTE — Progress Notes (Unsigned)
No chief complaint on file.   Patient was last seen in January to discuss her depression/anxiety and fatigue. She had to stop Wellbutrin related to taking tamoxifen.  She had been on citalopram 40mg , but despite this felt like she had to force herself out of bed daily, felt "dull".  It was a big adjustment with her daughter getting married (which was a miserable experience), and even though they were talking daily, still felt different to her. She wasn't enjoying social gatherings or gardening.  She had fogginess and lethargy, but feels rested when she wakes up.  She was waking up with headaches, wears a mouthguard. (Not interested in even discussing sleep study, as felt that her symptoms was related to her mood).  PHQ-9 was 14, GAD-7 score of 9, at 11/2022 visit. We elected to switch her from citalopram to an SNRI.  Pristiq was too expensive, so we changed to Effexor (tapered off citalopram as effexor dose was increased). Due to insurance issues, effexor wasn't started until mid-February.  She is currently taking  She continues to get regular therapy.     PMH, PSH, [place] reviewed   PHYSICAL EXAM:  LMP 11/19/2007   Wt Readings from Last 3 Encounters:  01/20/23 179 lb (81.2 kg)  12/23/22 172 lb 14.4 oz (78.4 kg)  11/28/22 175 lb (79.4 kg)        11/28/2022    1:27 PM 03/13/2022    9:52 AM 06/06/2021    3:12 PM 03/05/2021   10:29 AM 03/05/2021    9:40 AM  Depression screen PHQ 2/9  Decreased Interest 2 1 2  0 0  Down, Depressed, Hopeless 2 1 1  0 0  PHQ - 2 Score 4 2 3  0 0  Altered sleeping 1 1 0 3   Tired, decreased energy 2 1 1  0   Change in appetite 2 0 0 0   Feeling bad or failure about yourself  2 1 3  0   Trouble concentrating 3 0 2 3   Moving slowly or fidgety/restless 0 0 0 0   Suicidal thoughts 0 0 0 0   PHQ-9 Score 14 5 9 6    Difficult doing work/chores Somewhat difficult Not difficult at all         11/28/2022    1:27 PM 03/13/2022    9:46 AM 06/06/2021    3:14 PM  03/05/2021   10:32 AM  GAD 7 : Generalized Anxiety Score  Nervous, Anxious, on Edge 1 1 3 3   Control/stop worrying 1 0 3 1  Worry too much - different things 1 3 1 3   Trouble relaxing 0 0 1 2  Restless 0 0 0 0  Easily annoyed or irritable 3 1 3 1   Afraid - awful might happen 3 0 3 3  Total GAD 7 Score 9 5 14 13   Anxiety Difficulty Somewhat difficult Not difficult at all        ASSESSMENT/PLAN:  PHQ-9, GAD-t

## 2023-02-12 ENCOUNTER — Encounter: Payer: Self-pay | Admitting: Family Medicine

## 2023-02-12 ENCOUNTER — Ambulatory Visit (INDEPENDENT_AMBULATORY_CARE_PROVIDER_SITE_OTHER): Payer: Medicare Other | Admitting: Family Medicine

## 2023-02-12 VITALS — BP 120/76 | HR 72 | Ht 66.0 in | Wt 172.2 lb

## 2023-02-12 DIAGNOSIS — J302 Other seasonal allergic rhinitis: Secondary | ICD-10-CM | POA: Diagnosis not present

## 2023-02-12 DIAGNOSIS — F411 Generalized anxiety disorder: Secondary | ICD-10-CM | POA: Diagnosis not present

## 2023-02-12 DIAGNOSIS — F321 Major depressive disorder, single episode, moderate: Secondary | ICD-10-CM

## 2023-02-12 DIAGNOSIS — B001 Herpesviral vesicular dermatitis: Secondary | ICD-10-CM | POA: Diagnosis not present

## 2023-02-12 NOTE — Patient Instructions (Signed)
You can consider switching around the antihistamine and/or the inhaled steroid spray if allergies aren't well controlled.  Remember to keep the windows closed, and/or wear masks during the bad pollen times.

## 2023-02-17 ENCOUNTER — Encounter: Payer: Self-pay | Admitting: Family Medicine

## 2023-02-25 LAB — HM MAMMOGRAPHY

## 2023-02-27 ENCOUNTER — Encounter: Payer: Self-pay | Admitting: *Deleted

## 2023-02-27 ENCOUNTER — Other Ambulatory Visit: Payer: Self-pay | Admitting: Family Medicine

## 2023-02-27 DIAGNOSIS — F3341 Major depressive disorder, recurrent, in partial remission: Secondary | ICD-10-CM

## 2023-02-27 DIAGNOSIS — F411 Generalized anxiety disorder: Secondary | ICD-10-CM

## 2023-03-04 ENCOUNTER — Ambulatory Visit: Payer: BLUE CROSS/BLUE SHIELD | Admitting: Family Medicine

## 2023-03-06 ENCOUNTER — Encounter: Payer: Self-pay | Admitting: Family Medicine

## 2023-03-08 ENCOUNTER — Other Ambulatory Visit: Payer: Self-pay | Admitting: Hematology and Oncology

## 2023-03-13 ENCOUNTER — Other Ambulatory Visit: Payer: Self-pay | Admitting: Family Medicine

## 2023-03-13 ENCOUNTER — Encounter: Payer: Self-pay | Admitting: Family Medicine

## 2023-03-13 DIAGNOSIS — F321 Major depressive disorder, single episode, moderate: Secondary | ICD-10-CM

## 2023-03-13 DIAGNOSIS — F411 Generalized anxiety disorder: Secondary | ICD-10-CM

## 2023-03-18 NOTE — Patient Instructions (Incomplete)
HEALTH MAINTENANCE RECOMMENDATIONS:  It is recommended that you get at least 30 minutes of aerobic exercise at least 5 days/week (for weight loss, you may need as much as 60-90 minutes). This can be any activity that gets your heart rate up. This can be divided in 10-15 minute intervals if needed, but try and build up your endurance at least once a week.  Weight bearing exercise is also recommended twice weekly.  Eat a healthy diet with lots of vegetables, fruits and fiber.  "Colorful" foods have a lot of vitamins (ie green vegetables, tomatoes, red peppers, etc).  Limit sweet tea, regular sodas and alcoholic beverages, all of which has a lot of calories and sugar.  Up to 1 alcoholic drink daily may be beneficial for women (unless trying to lose weight, watch sugars).  Drink a lot of water.  Calcium recommendations are 1200-1500 mg daily (1500 mg for postmenopausal women or women without ovaries), and vitamin D 1000 IU daily.  This should be obtained from diet and/or supplements (vitamins), and calcium should not be taken all at once, but in divided doses.  Monthly self breast exams and yearly mammograms for women over the age of 77 is recommended.  Sunscreen of at least SPF 30 should be used on all sun-exposed parts of the skin when outside between the hours of 10 am and 4 pm (not just when at beach or pool, but even with exercise, golf, tennis, and yard work!)  Use a sunscreen that says "broad spectrum" so it covers both UVA and UVB rays, and make sure to reapply every 1-2 hours.  Remember to change the batteries in your smoke detectors when changing your clock times in the spring and fall. Carbon monoxide detectors are recommended for your home.  Use your seat belt every time you are in a car, and please drive safely and not be distracted with cell phones and texting while driving.   Ms. Pettijohn , Thank you for taking time to come for your Medicare Wellness Visit. I appreciate your ongoing  commitment to your health goals. Please review the following plan we discussed and let me know if I can assist you in the future.   This is a list of the screening recommended for you and due dates:  Health Maintenance  Topic Date Due   COVID-19 Vaccine (7 - 2023-24 season) 10/11/2022   Pneumonia Vaccine (1 of 1 - PCV) Never done   Flu Shot  06/26/2023   Mammogram  02/25/2024   Medicare Annual Wellness Visit  03/18/2024   Pap Smear  03/05/2026   Colon Cancer Screening  06/15/2028   DTaP/Tdap/Td vaccine (3 - Td or Tdap) 03/17/2031   DEXA scan (bone density measurement)  Completed   Hepatitis C Screening: USPSTF Recommendation to screen - Ages 43-79 yo.  Completed   HIV Screening  Completed   Zoster (Shingles) Vaccine  Completed   HPV Vaccine  Aged Out   Prevnar-20 was given today.  We discussed RSV vaccine, to get in the fall, along with a high dose flu shot (now that you are 67). You are also now eligible for another COVID vaccine.  You should wait 2 weeks from today's pneumonia shot.  I will give specific info on your lab results, but we briefly discussed taking 2000 IU of D3 daily, year-round (instead of a multivitamin and separate D3 in the winter). Be sure to eat a well-rounded diet with lots of colorful foods. You may want to take a  separate B12 (since you had slightly low levels in the past, and this can affect the size of your blood cells).  We briefly discussed the potential for montelukast affecting moods. You have been on this chronically for a while, so I doubt this is the issue.  Since your allergies are seasonal, you may not actually need to use it year-round.  Feel free to try stopping the montelukast from November through February, if you want to see if that has any effect on moods without adversely affecting your allergies.  Do warm soapy soaks for your right finger, and use antibacterial ointment (ie bacitracin or neosporin) 2-3 times daily to the cut on your  finger. Keep an eye on the redness--if it increases in size, if you notice streaks up the finger, if you have any increased pain, fever or other concerns, please contact us.  You can send a photo through MyChart.  These would be signs of an infection that should be treated.  Hopefully we can avoid this by the soaks and topical ointment.

## 2023-03-18 NOTE — Progress Notes (Unsigned)
Rachel Juarez is a 67 y.o. female who presents for Welcome to Medicare visit, and follow-up on chronic medical conditions.   She has the following concerns:  Depression: She started on effexor in mid-February. At f/u visit in March she was taking  of effexor for 3-4 weeks  (having switched from citalopram; she stopped Wellbutrin when she started on Tamoxifen). She noted improvement with this change in medication, no longer feeling as "dull"--was excited about gardening season, looking forward to social gatherings, not feeling like she had to force herself.  Still had some fatigue. Anxiety: she continues on Buspar  BID, and anxiety is well controlled. Seeing therapist regularly, doing some hard work with the therapist-- self-forgiveness related to things in her 20's.  Hyperlipidemia:  Patient is compliant with taking rosuvastatin  daily, and is tolerating this without side effects. She continues to try and limit her cheese, eats red meat once a week, french fries 1-2x/week and no other fried foods, occasional eggs (only when out, <1 week). Lipids were at goal on this regimen, due for recheck.  Lab Results  Component Value Date   CHOL 180 02/14/2022   HDL 84 02/14/2022   LDLCALC 84 02/14/2022   TRIG 64 02/14/2022   CHOLHDL 2.1 02/14/2022     Increased risk for breast cancer:  She is currently on  of tamoxifen, with plans to take it for 5 years (started 11/2021). This is for prevention of breast cancer, under the care of Dr. Pamelia Hoit. She has mild hot flashes, no worse than she had prior to starting therapy.  She denies any breast concerns. She denies any vaginal bleeding or spotting.    H/o Vit D deficiency--Level was normal at 34.6 in 01/2022 when taking D only from her daily MVI. She was advised to take an additional 1000 IU of D3 October through March/April, and continue MVI daily. She is outside more now that she is back in the garden.   Hypothyroidism/multinodular goiter:  She is under the care of Dr. Talmage Nap.  Denies any changes to hair/skin/bowels/nails/moods. Lab Results  Component Value Date   TSH 0.869 02/14/2022   Last thyroid US was stable, in 01/2022: IMPRESSION: Stable exam of the thyroid gland with unchanged appearance of a small subcentimeter solitary nodule in the right thyroid lobe. No new nodules or other findings that warrant further dedicated follow-up or biopsy.  Allergies: Year-round, with seasonal component in Spring/Fall. She is doing well on singulair, nasacort and allegra daily, along with OTC eye drops.      Herpes labialis--uses Valtrex prn.   Last filled in 06/2021, #28. Medication is effective, still has some at home. Flares are infrequent, none in the last few months.  Macrocytosis.  previously had B12 in the 330 range. MCV was at 100, prev up to 104 per last records.  Pt recalls seeing hematologist in the past.  No work-up was needed. She denies symptoms of anemia, denies numbness, tingling or memory concerns. She takes MVI daily. She continues to have 2 alcoholic beverages daily. Lab Results  Component Value Date   WBC 3.9 02/14/2022   HGB 13.4 02/14/2022   HCT 39.8 02/14/2022   MCV 100 (H) 02/14/2022   PLT 249 02/14/2022       Immunization History  Administered Date(s) Administered   Influenza,inj,Quad PF,6+ Mos 08/30/2015, 08/21/2020, 08/29/2021   Influenza-Unspecified 07/30/2017, 08/24/2018, 08/20/2019, 08/16/2022   Moderna Covid-19 Vaccine Bivalent Booster 59yrs & up 08/15/2021   Moderna Sars-Covid-2 Vaccination 12/07/2019, 01/05/2020, 09/21/2020, 02/28/2021  Tdap 02/12/2011, 03/16/2021   Unspecified SARS-COV-2 Vaccination 08/16/2022   Zoster Recombinat (Shingrix) 03/06/2018, 05/08/2018   Last Pap smear: 02/2021, normal, no high risk HPV detected Last mammogram:  02/2023, normal breast MRI 08/2023 Last colonoscopy: 05/2018 Dr. Ewing Schlein, hyperplastic polyps and diverticulosis. Repeat 10 years. She did hemoccult testing  sent to her by her insurance in 01/2021. Last DEXA: 02/2022 decreased at L hip (-2.0), spine, R hip stable (Prior 07/2020 T-1.9 L fem neck, improved at R hip, spine and L hip stable). Ordered by Dr. Talmage Nap Dentist: twice a year Ophtho: Yearly (Dr. Ranae Pila) Exercise:  PT 2x/week, personal trainer 2x/week, gardening. Walking? Yoga?   Hepatitis C screening--previously donated blood regularly  Patient Care Team: Joselyn Arrow, MD as PCP - General (Family Medicine) Ophtho: Dr. Ranae Pila Ortho: Dr. Jerl Santos Heme-Onc: Dr. Pamelia Hoit Endo: Dr. Talmage Nap GI:  Dr. Ewing Schlein Derm: Elpidio Anis (prev saw Dr. Jorja Loa)  Depression Screening: Flowsheet Row Office Visit from 02/12/2023 in [facility]  PHQ-2 Total Score 4       Flowsheet Row Office Visit from 02/12/2023 in [facility]  PHQ-9 Total Score 12       Falls screen:     03/13/2022    9:45 AM 02/14/2022   10:18 AM 03/05/2021    9:39 AM  Fall Risk   Falls in the past year? Number falls in past yr: 0 0 0  Comment June 2022 June 22 left leg injured   Injury with Fall? Comment popped ankle  foot injury  Risk for fall due to : No Fall Risks History of fall(s) Other (Comment)  Risk for fall due to: Comment   missed a few steps  Follow up Falls evaluation completed Falls evaluation completed Falls evaluation completed     Functional Status Survey:         End of Life Discussion:  Patient {ACTIONS; HAS/DOES NOT HAVE:19233} a living will and medical power of attorney   PMH, PSH, SH and FH were reviewed and updated    ROS:  The patient denies, fever, weight changes, headaches,  vision changes, decreased hearing, ear pain, sore throat, breast concerns, chest pain, palpitations, dizziness, syncope, dyspnea on exertion, cough, swelling, nausea, vomiting, diarrhea, constipation, abdominal pain, melena, hematochezia, indigestion/heartburn, hematuria, incontinence, dysuria, vaginal bleeding, discharge, odor or itch, genital  lesions, joint pains, numbness, tingling, weakness, tremor, suspicious skin lesions, abnormal bleeding or enlarged lymph nodes. Pain with intercourse, vaginal dryness. Mild, infrequent hot flashes. Allergies, controlled. Depression and anxiety stable.   PHYSICAL EXAM:  LMP 11/19/2007   Wt Readings from Last 3 Encounters:  02/12/23 172 lb 3.2 oz (78.1 kg)  01/20/23 179 lb (81.2 kg)  12/23/22 172 lb 14.4 oz (78.4 kg)    General Appearance:    Alert, cooperative, no distress, appears stated age.   Head:    Normocephalic, without obvious abnormality, atraumatic  Eyes:    PERRL, conjunctiva/corneas clear, EOM's intact, fundi benign  Ears:    Narrow canals, nonobstructive cerumen.  Visualized portions of TM's are normal  Nose:   Normal, no drainage  Throat:   Normal no mucosal lesions  Neck:   Supple, no lymphadenopathy;  thyroid: mildly enlarged, nodular, more prominent on the right; no carotid bruit or JVD  Back:    Spine nontender, no curvature, ROM normal, no CVA  tenderness  Lungs:     Clear to auscultation bilaterally without wheezes, rales or  ronchi; respirations unlabored  Chest Wall:  No tenderness or deformity   Heart:    Regular rate and rhythm, S1 and S2 normal, no murmur, rub   or gallop  Breast Exam:   No dimpling, nipple discharge or inversion. No axillary lymphadenopathy.   Abdomen:     Soft, non-tender, nondistended, normoactive bowel sounds,    no masses, no hepatosplenomegaly  Genitalia:    Normal external genitalia without lesions; mild atrophic changes.  BUS and vagina normal; no cervical motion tenderness. No abnormal vaginal discharge.  Uterus and adnexa not enlarged, nontender, no masses. Pap not performed  Rectal:    Normal tone, no masses or tenderness; guaiac negative stool  Extremities:   No clubbing, cyanosis or edema.   Pulses:   2+ and symmetric all extremities  Skin:   Skin color, texture, turgor normal, no rashes or lesions.   Lymph nodes:   Cervical,  supraclavicular, inguinal and axillary nodes normal  Neurologic:   Normal strength, sensation and gait; reflexes 2+ and symmetric throughout                               Psych:  Normal mood, affect, hygiene and grooming  ENTER GAD/PHQ ***  ***update if cerumen present Update thyroid (enlarged, more nodular on R)  ASSESSMENT/PLAN:  Last [facility] visit 02/2022? Does she have appt scheduled?  (Do we check TSH or let Dr. Talmage Nap do it?? Last year we did and forwarded result?)  PHQ-9, GAD-7 Prevnar-20 Eligible for COVID booster (wouldn't do both today). Can get RSV with flu shot (change to high dose) in the Fall, if desired  RF buspar, montelukast, venlafaxine. ?valtrex? Crestor good until late June  Cbc, c-met, lipids D if not taking supplement/MVI ??TSH, or getting from Dr. Talmage Nap   Discussed monthly self breast exams and yearly mammograms (also getting yearly breast MRI); at least 30 minutes of aerobic activity at least 5 days/week, weight-bearing exercise at least 2x/wk; proper sunscreen use reviewed; healthy diet, including goals of calcium and vitamin D intake and alcohol recommendations (less than or equal to 1 drink/day) reviewed; regular seatbelt use; changing batteries in smoke detectors. Immunization recommendations discussed--continue yearly flu shots, switch to high dose. Prevnar-20 given today.  COVID booster RSV discussed, recommended to get with her flu shot in the Fall. Colonoscopy UTD, due again 05/2028 (can consider Cologuard at that time instead)     Medicare Attestation I have personally reviewed: The patient's medical and social history Their use of alcohol, tobacco or illicit drugs Their current medications and supplements The patient's functional ability including ADLs,fall risks, home safety risks, cognitive, and hearing and visual impairment Diet and physical activities Evidence for depression or mood disorders  The patient's weight, height, BMI have been  recorded in the chart.  I have made referrals, counseling, and provided education to the patient based on review of the above and I have provided the patient with a written personalized care plan for preventive services.     Lavonda Jumbo, MD

## 2023-03-19 ENCOUNTER — Ambulatory Visit: Payer: Medicare Other | Admitting: Family Medicine

## 2023-03-19 ENCOUNTER — Encounter: Payer: Self-pay | Admitting: Family Medicine

## 2023-03-19 VITALS — BP 110/64 | HR 80 | Ht 66.0 in | Wt 174.0 lb

## 2023-03-19 DIAGNOSIS — D7589 Other specified diseases of blood and blood-forming organs: Secondary | ICD-10-CM

## 2023-03-19 DIAGNOSIS — F321 Major depressive disorder, single episode, moderate: Secondary | ICD-10-CM | POA: Diagnosis not present

## 2023-03-19 DIAGNOSIS — Z0000 Encounter for general adult medical examination without abnormal findings: Secondary | ICD-10-CM

## 2023-03-19 DIAGNOSIS — F411 Generalized anxiety disorder: Secondary | ICD-10-CM | POA: Diagnosis not present

## 2023-03-19 DIAGNOSIS — Z6828 Body mass index (BMI) 28.0-28.9, adult: Secondary | ICD-10-CM

## 2023-03-19 DIAGNOSIS — E042 Nontoxic multinodular goiter: Secondary | ICD-10-CM

## 2023-03-19 DIAGNOSIS — Z23 Encounter for immunization: Secondary | ICD-10-CM

## 2023-03-19 DIAGNOSIS — Z9189 Other specified personal risk factors, not elsewhere classified: Secondary | ICD-10-CM

## 2023-03-19 DIAGNOSIS — J302 Other seasonal allergic rhinitis: Secondary | ICD-10-CM

## 2023-03-19 DIAGNOSIS — R921 Mammographic calcification found on diagnostic imaging of breast: Secondary | ICD-10-CM

## 2023-03-19 DIAGNOSIS — E7800 Pure hypercholesterolemia, unspecified: Secondary | ICD-10-CM

## 2023-03-19 DIAGNOSIS — B001 Herpesviral vesicular dermatitis: Secondary | ICD-10-CM

## 2023-03-19 DIAGNOSIS — E039 Hypothyroidism, unspecified: Secondary | ICD-10-CM

## 2023-03-19 DIAGNOSIS — E559 Vitamin D deficiency, unspecified: Secondary | ICD-10-CM

## 2023-03-19 DIAGNOSIS — Z5181 Encounter for therapeutic drug level monitoring: Secondary | ICD-10-CM

## 2023-03-19 LAB — COMPREHENSIVE METABOLIC PANEL

## 2023-03-19 LAB — CBC WITH DIFFERENTIAL/PLATELET
Basophils Absolute: 0 10*3/uL (ref 0.0–0.2)
Hemoglobin: 14.8 g/dL (ref 11.1–15.9)
Immature Grans (Abs): 0 10*3/uL (ref 0.0–0.1)
Lymphocytes Absolute: 2 10*3/uL (ref 0.7–3.1)
Lymphs: 33 %
MCHC: 32.8 g/dL (ref 31.5–35.7)
RDW: 12.3 % (ref 11.7–15.4)

## 2023-03-19 MED ORDER — VALACYCLOVIR HCL 1 G PO TABS
ORAL_TABLET | ORAL | 0 refills | Status: DC
Start: 2023-03-19 — End: 2024-03-22

## 2023-03-19 MED ORDER — MONTELUKAST SODIUM 10 MG PO TABS: ORAL_TABLET | ORAL | 3 refills | Status: DC

## 2023-03-19 MED ORDER — BUSPIRONE HCL 15 MG PO TABS: 15.0000 mg | ORAL_TABLET | Freq: Two times a day (BID) | ORAL | 3 refills | Status: DC

## 2023-03-19 MED ORDER — VENLAFAXINE HCL ER 75 MG PO CP24: 75.0000 mg | ORAL_CAPSULE | Freq: Every day | ORAL | 1 refills | Status: DC

## 2023-03-20 ENCOUNTER — Encounter: Payer: Self-pay | Admitting: Family Medicine

## 2023-03-20 LAB — CBC WITH DIFFERENTIAL/PLATELET
Basos: 0 %
EOS (ABSOLUTE): 0.2 10*3/uL (ref 0.0–0.4)
Eos: 3 %
Hematocrit: 45.1 % (ref 34.0–46.6)
Immature Granulocytes: 0 %
MCH: 33.3 pg — ABNORMAL HIGH (ref 26.6–33.0)
MCV: 101 fL — ABNORMAL HIGH (ref 79–97)
Monocytes Absolute: 0.5 10*3/uL (ref 0.1–0.9)
Monocytes: 8 %
Neutrophils Absolute: 3.3 10*3/uL (ref 1.4–7.0)
Neutrophils: 56 %
Platelets: 268 10*3/uL (ref 150–450)
RBC: 4.45 x10E6/uL (ref 3.77–5.28)
WBC: 6 10*3/uL (ref 3.4–10.8)

## 2023-03-20 LAB — VITAMIN D 25 HYDROXY (VIT D DEFICIENCY, FRACTURES): Vit D, 25-Hydroxy: 29.8 ng/mL — ABNORMAL LOW (ref 30.0–100.0)

## 2023-03-20 LAB — COMPREHENSIVE METABOLIC PANEL
ALT: 48 IU/L — ABNORMAL HIGH (ref 0–32)
Albumin: 4.7 g/dL (ref 3.9–4.9)
Alkaline Phosphatase: 60 IU/L (ref 44–121)
BUN: 10 mg/dL (ref 8–27)
Bilirubin Total: 0.4 mg/dL (ref 0.0–1.2)
CO2: 23 mmol/L (ref 20–29)
Chloride: 103 mmol/L (ref 96–106)
Globulin, Total: 2.3 g/dL (ref 1.5–4.5)
Glucose: 94 mg/dL (ref 70–99)
Potassium: 5.2 mmol/L (ref 3.5–5.2)
Total Protein: 7 g/dL (ref 6.0–8.5)
eGFR: 83 mL/min/{1.73_m2} (ref >59)

## 2023-03-20 LAB — LIPID PANEL
Chol/HDL Ratio: 2.8 ratio (ref 0.0–4.4)
Cholesterol, Total: 182 mg/dL (ref 100–199)
HDL: 65 mg/dL (ref >39)
LDL Chol Calc (NIH): 96 mg/dL (ref 0–99)
Triglycerides: 120 mg/dL (ref 0–149)
VLDL Cholesterol Cal: 21 mg/dL (ref 5–40)

## 2023-03-20 LAB — TSH: TSH: 0.75 u[IU]/mL (ref 0.450–4.500)

## 2023-03-26 ENCOUNTER — Telehealth: Payer: Self-pay | Admitting: Family Medicine

## 2023-03-26 NOTE — Telephone Encounter (Signed)
Pt dropped off POA forms put in your folder to review

## 2023-03-31 NOTE — Progress Notes (Deleted)
[facility] [address] [place] [address] Phone: [phone] Subjective:    I'm seeing this patient by the request  of:  Rachel Arrow, MD  CC:   [id number]  01/20/2023 Intermittent pain that seems to be more in the patellofemoral and does have some underlying medial joint space narrowing as well.  Does respond relatively well though to the conservative therapy but at this point with her not making significant improvements we did do an injection today.  Hopeful that this will make significant improvement long-term.  Discussed icing regimen and home exercises, discussed which activities should be potentially avoided initially.  Concerned that some of this could be secondary to the range of motion of her ankle as well that we will need to continue to monitor.  Discussed proper shoe choices.  Follow-up again in 6 to 8 weeks    Update 04/01/2023 Rachel Juarez is a 67 y.o. female coming in with complaint of R knee pain. Patient states      Past Medical History:  Diagnosis Date   Allergy    Anxiety    Depression, major, in remission (HCC) 2006   when daughter dx'd with autoimmune disorder   Family history of breast cancer    Family history of cancer tonsil    Family history of thyroid cancer    Generalized anxiety disorder    generalized, and related to mammograms and flying   Glaucoma suspect    sees Dr. Dione Booze, has all been okay, sees him qoyr   Herpes labialis    Hypercholesterolemia    high HDL, borderline LDL   Hypothyroidism    treated by Dr. Talmage Nap   Multinodular goiter    followed by Dr. 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 Ranae Pila   KNEE ARTHROSCOPY Right 02/21/2022   for torn medial meniscus (Dr. Jerl Santos)    Social History   Socioeconomic History   Marital status: Married    Spouse name: Not on file   Number of children: Not on file   Years of education: Not on file   Highest education level: Not on file  Occupational History   Occupation: yoga teacher  Tobacco Use   Smoking status: Never   Smokeless tobacco: Never  Vaping Use   Vaping Use: Never used  Substance and Sexual Activity   Alcohol use: Yes    Comment: 1-2 Vodka sodas/day   Drug use: No   Sexual activity: Yes    Partners: Male    Birth control/protection: Post-menopausal  Other Topics Concern   Not on file  Social History Narrative   Pre-COVID, taught yoga at multiple places.    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 1 dog (Malt[employer]poo)   Lives with her husband, and she has a daughter, Rachel Juarez--graduated from [employer], married, lives in [place], works as an Scientist, forensic at [employer].   Volunteers at the [employer]      Updated 02/2023   Social Determinants of Health   Financial Resource Strain: Not on file  Food Insecurity: Not on file  Transportation Needs: Not on file  Physical Activity: Not on file  Stress: Not on file  Social Connections: Not on file  Allergies  Allergen Reactions   Demerol [Meperidine] Other (See Comments)    Low bp, passes out   Family History  Problem Relation Age of Onset   Lupus Mother    Cancer Father 78       thymic cancer   COPD Father    Heart disease Father        CHF   Asthma Father    Cancer Sister    Breast cancer Sister 42       "best kind", lumpectomy and radiation   Dermatomyositis Daughter 84       juvenile form   Irritable bowel syndrome Daughter    Colon cancer Maternal Grandmother        80's   Cancer Maternal Grandmother    Hypertension Paternal Grandmother    Hyperlipidemia Paternal Grandmother    Thyroid disease Paternal Grandmother        had goiter removed   Stroke Paternal Grandmother 88   Hypertension Paternal Grandfather     Hyperlipidemia Paternal Grandfather    Heart disease Paternal Grandfather        MI first in his 43's   Stroke Paternal Grandfather    Cancer Paternal Uncle        started on tonsils (smoker)   Breast cancer Paternal Aunt 55   Breast cancer Cousin 70   Breast cancer Cousin 109       (the mother of the 33 y/o cousin with br ca   Thyroid cancer Cousin    Diabetes Neg Hx    Ovarian cancer Neg Hx     Current Outpatient Medications (Endocrine & Metabolic):    levothyroxine (SYNTHROID, LEVOTHROID) 75 MCG tablet, Take 75 mcg by mouth daily before breakfast.  Current Outpatient Medications (Cardiovascular):    rosuvastatin (CRESTOR) 10 MG tablet, TAKE 1 TABLET BY MOUTH EVERY DAY  Current Outpatient Medications (Respiratory):    cetirizine (ZYRTEC) 10 MG tablet, Take 10 mg by mouth daily.   fluticasone (FLONASE) 50 MCG/ACT nasal spray, Place 2 sprays into both nostrils daily.   montelukast (SINGULAIR) 10 MG tablet, TAKE 1 TABLET BY MOUTH EVERYDAY AT BEDTIME    Current Outpatient Medications (Other):    ALPRAZolam (XANAX) 0.5 MG tablet, Take 0.5-1 tablets (0.25-0.5 mg total) by mouth 3 (three) times daily as needed for anxiety or sleep. (Patient not taking: Reported on 08/30/2022)   busPIRone (BUSPAR) 15 MG tablet, Take 1 tablet (15 mg total) by mouth 2 (two) times daily.   Calcium 250 MG CAPS, Take 2 capsules by mouth daily.   hydrocortisone 2.5 % ointment, Apply topically 2 (two) times daily. (Patient not taking: Reported on 02/14/2022)   Magnesium 500 MG TABS, Take 1 tablet by mouth daily.   MILK THISTLE PO, Take 1 capsule by mouth daily.   Misc Natural Products (TART CHERRY ADVANCED PO), Take 1 capsule by mouth daily.   Multiple Vitamins-Minerals (MULTIVITAMIN WITH MINERALS) tablet, Take 1 tablet by mouth daily.   mupirocin ointment (BACTROBAN) 2 %, Apply 1 application topically 2 (two) times daily. (Patient not taking: Reported on 02/14/2022)   NON FORMULARY, Take 2 capsules by mouth  daily.   NON FORMULARY, Take 2 capsules by mouth daily.   Omega-3 Fatty Acids (FISH OIL) 1000 MG CPDR, Take 2,000 mg by mouth daily.   tamoxifen (NOLVADEX) 10 MG tablet, TAKE 1/2 TABLET BY MOUTH DAILY   triamcinolone cream (KENALOG) 0.5 %, Apply topically 3 (three) times daily. (Patient not taking: Reported on 11/28/2022)   valACYclovir (  VALTREX) 1000 MG tablet, TAKE 2 TABLETS AT ONSET OF COLD SORE. REPEAT ONCE IN 12 HOURS (4 PILLS/COURSE)   venlafaxine XR (EFFEXOR XR) 75 MG 24 hr capsule, Take 1 capsule (75 mg total) by mouth daily with breakfast.   vitamin C (ASCORBIC ACID) 500 MG tablet, Take 500 mg by mouth daily.   Reviewed prior external information including notes and imaging from  primary care provider As well as notes that were available from care everywhere and other healthcare systems.  Past medical history, social, surgical and family history all reviewed in electronic medical record.  No pertanent information unless stated regarding to the chief complaint.   Review of Systems:  No headache, visual changes, nausea, vomiting, diarrhea, constipation, dizziness, abdominal pain, skin rash, fevers, chills, night sweats, weight loss, swollen lymph nodes, body aches, joint swelling, chest pain, shortness of breath, mood changes. POSITIVE muscle aches  Objective  Last menstrual period 11/19/2007.   General: No apparent distress alert and oriented x3 mood and affect normal, dressed appropriately.  HEENT: Pupils equal, extraocular movements intact  Respiratory: Patient's speak in full sentences and does not appear short of breath  Cardiovascular: No lower extremity edema, non tender, no erythema      Impression and Recommendations:

## 2023-04-01 ENCOUNTER — Ambulatory Visit: Payer: BLUE CROSS/BLUE SHIELD | Admitting: Family Medicine

## 2023-04-02 ENCOUNTER — Encounter: Payer: Self-pay | Admitting: Family Medicine

## 2023-04-02 NOTE — Telephone Encounter (Signed)
Advise pt--she had mentioned feeling like she was coming out of a funk at her last visit, but if she and therapist don't feel like you are doing as well ,we can increased to the 150mg  dose. Okay to send in #30 with 1 refill, and schedule a med check for 4-6 weeks after the dose increase (can be virtual).

## 2023-04-03 ENCOUNTER — Other Ambulatory Visit (INDEPENDENT_AMBULATORY_CARE_PROVIDER_SITE_OTHER): Payer: Medicare Other

## 2023-04-03 DIAGNOSIS — Z23 Encounter for immunization: Secondary | ICD-10-CM

## 2023-04-11 ENCOUNTER — Other Ambulatory Visit: Payer: Self-pay | Admitting: Family Medicine

## 2023-04-11 DIAGNOSIS — E7800 Pure hypercholesterolemia, unspecified: Secondary | ICD-10-CM

## 2023-05-12 ENCOUNTER — Encounter: Payer: Self-pay | Admitting: *Deleted

## 2023-05-13 NOTE — Progress Notes (Unsigned)
Start time: End time:  Virtual Visit via Video Note  I connected with Rachel Juarez on 05/13/23 by a video enabled telemedicine application and verified that I am speaking with the correct person using two identifiers.  Location: Patient: *** Provider: office   I discussed the limitations of evaluation and management by telemedicine and the availability of in person appointments. The patient expressed understanding and agreed to proceed.  History of Present Illness:  No chief complaint on file.  Patient presents for follow-up on depression and anxiety. She sent message 5/8 stating: "In a visit with my therapist yesterday I noted how I feel flat-no real anxiety on one end but also no joy or looking forward to anything on the other. She noted this was a depression related feeling and wondered if the Effexor should be increased. I continue to plod ahead each day and go about my daily tasks (including exercise) but don't really look forward to anything. I'm also irritable!"  At that time, her Effexor dose was increased from 75mg  to 150mg .  She had just gotten 90d supply of 75mg  capsules, so has been doubling up.  Today she reports   She started on effexor in mid-February (having switched from citalopram; she stopped Wellbutrin when she started on Tamoxifen).  She continues on Buspar 15mg  BID for anxiety.  Seeing therapist regularly.     Observations/Objective:  LMP 11/19/2007        03/19/2023   10:23 AM 02/12/2023    1:45 PM 11/28/2022    1:27 PM 03/13/2022    9:52 AM 06/06/2021    3:12 PM  Depression screen PHQ 2/9  Decreased Interest 0 3 2 1 2   Down, Depressed, Hopeless 0 1 2 1 1   PHQ - 2 Score 0 4 4 2 3   Altered sleeping 0 3 1 1  0  Tired, decreased energy 1 1 2 1 1   Change in appetite 0 1 2 0 0  Feeling bad or failure about yourself  0 3 2 1 3   Trouble concentrating 1 0 3 0 2  Moving slowly or fidgety/restless 0 0 0 0 0  Suicidal thoughts 0 0 0 0 0  PHQ-9  Score 2 12 14 5 9   Difficult doing work/chores Somewhat difficult Somewhat difficult Somewhat difficult Not difficult at all       03/19/2023   10:25 AM 02/12/2023    1:41 PM 11/28/2022    1:27 PM 03/13/2022    9:46 AM  GAD 7 : Generalized Anxiety Score  Nervous, Anxious, on Edge 0 1 1 1   Control/stop worrying 0 0 1 0  Worry too much - different things 0 0 1 3  Trouble relaxing 0 1 0 0  Restless 0 0 0 0  Easily annoyed or irritable 0 2 3 1   Afraid - awful might happen 0 1 3 0  Total GAD 7 Score 0 5 9 5   Anxiety Difficulty Not difficult at all Not difficult at all Somewhat difficult Not difficult at all      Assessment and Plan:   PHQ-9 GAD-7   Follow Up Instructions:    I discussed the assessment and treatment plan with the patient. The patient was provided an opportunity to ask questions and all were answered. The patient agreed with the plan and demonstrated an understanding of the instructions.   The patient was advised to call back or seek an in-person evaluation if the symptoms worsen or if the condition fails to improve as anticipated.  I spent *** minutes dedicated to the care of this patient, including pre-visit review of records, face to face time, post-visit ordering of testing and documentation.    Rachel Jumbo, MD

## 2023-05-14 ENCOUNTER — Telehealth (INDEPENDENT_AMBULATORY_CARE_PROVIDER_SITE_OTHER): Payer: Medicare Other | Admitting: Family Medicine

## 2023-05-14 ENCOUNTER — Encounter: Payer: Self-pay | Admitting: Family Medicine

## 2023-05-14 VITALS — Wt 164.0 lb

## 2023-05-14 DIAGNOSIS — F411 Generalized anxiety disorder: Secondary | ICD-10-CM | POA: Diagnosis not present

## 2023-05-14 DIAGNOSIS — F321 Major depressive disorder, single episode, moderate: Secondary | ICD-10-CM

## 2023-05-14 MED ORDER — VENLAFAXINE HCL ER 150 MG PO CP24
150.0000 mg | ORAL_CAPSULE | Freq: Every day | ORAL | 1 refills | Status: DC
Start: 2023-05-14 — End: 2023-09-22

## 2023-05-18 ENCOUNTER — Encounter: Payer: Self-pay | Admitting: Family Medicine

## 2023-05-19 ENCOUNTER — Encounter: Payer: Self-pay | Admitting: Family Medicine

## 2023-05-19 ENCOUNTER — Ambulatory Visit (INDEPENDENT_AMBULATORY_CARE_PROVIDER_SITE_OTHER): Payer: Medicare Other | Admitting: Family Medicine

## 2023-05-19 VITALS — BP 114/74 | HR 68 | Temp 98.1°F | Ht 66.0 in | Wt 169.8 lb

## 2023-05-19 DIAGNOSIS — M79672 Pain in left foot: Secondary | ICD-10-CM | POA: Diagnosis not present

## 2023-05-19 NOTE — Progress Notes (Signed)
Chief Complaint  Patient presents with   Foreign Body    Possible foreign (partial) in left heel. Thinks it was top of spray bottle, hard plastic shard. She did pull most of it out and it bled. Feels sore. By the end of the day she cannot walk.     6/20 she stepped on something while cleaning in her house barefoot--something got stuck in the left heel (hard plastic).  She "flicked it off", and it bled for a bit. It has been sore since. By the end of the day she can't put any pressure on the heel. She feels a hard knot, unsure if she still has any plastic inside.  No known fever.   PMH, PSH, SH reviewed  Outpatient Encounter Medications as of 05/19/2023  Medication Sig Note   b complex vitamins capsule Take 1 capsule by mouth daily.    busPIRone (BUSPAR) 15 MG tablet Take 1 tablet (15 mg total) by mouth 2 (two) times daily.    Calcium 250 MG CAPS Take 1-2 capsules by mouth daily. 05/19/2023: Daily, based on diet may take 1 or 2   cetirizine (ZYRTEC) 10 MG tablet Take 10 mg by mouth daily.    cholecalciferol (VITAMIN D3) 25 MCG (1000 UNIT) tablet Take 1,000 Units by mouth daily.    fluticasone (FLONASE) 50 MCG/ACT nasal spray Place 2 sprays into both nostrils daily.    levothyroxine (SYNTHROID, LEVOTHROID) 75 MCG tablet Take 75 mcg by mouth daily before breakfast.    Magnesium 500 MG TABS Take 1 tablet by mouth daily.    MILK THISTLE PO Take 1 capsule by mouth daily.    Misc Natural Products (TART CHERRY ADVANCED PO) Take 1 capsule by mouth daily.    montelukast (SINGULAIR) 10 MG tablet TAKE 1 TABLET BY MOUTH EVERYDAY AT BEDTIME    NON FORMULARY Take 2 capsules by mouth daily. 01/26/2020: DoTerra On Guard   NON FORMULARY Take 2 capsules by mouth daily. 01/26/2020: DoTerra Zendocrine   Omega-3 Fatty Acids (FISH OIL) 1000 MG CPDR Take 2,000 mg by mouth daily.    rosuvastatin (CRESTOR) 10 MG tablet TAKE 1 TABLET BY MOUTH EVERY DAY    tamoxifen (NOLVADEX) 10 MG tablet TAKE 1/2 TABLET BY MOUTH  DAILY    venlafaxine XR (EFFEXOR XR) 150 MG 24 hr capsule Take 1 capsule (150 mg total) by mouth daily with breakfast.    vitamin C (ASCORBIC ACID) 500 MG tablet Take 500 mg by mouth daily.    [DISCONTINUED] Multiple Vitamins-Minerals (MULTIVITAMIN WITH MINERALS) tablet Take 1 tablet by mouth daily. 03/19/2023: Few times a week   ALPRAZolam (XANAX) 0.5 MG tablet Take 0.5-1 tablets (0.25-0.5 mg total) by mouth 3 (three) times daily as needed for anxiety or sleep. (Patient not taking: Reported on 05/19/2023) 05/19/2023: Uses before flying. Before mammogram/breast MRI's     hydrocortisone 2.5 % ointment Apply topically 2 (two) times daily. (Patient not taking: Reported on 05/14/2023) 05/19/2023: As needed   mupirocin ointment (BACTROBAN) 2 % Apply 1 application topically 2 (two) times daily. (Patient not taking: Reported on 05/14/2023) 05/19/2023: As needed   triamcinolone cream (KENALOG) 0.5 % Apply topically 3 (three) times daily. (Patient not taking: Reported on 11/28/2022) 05/19/2023: As needed   valACYclovir (VALTREX) 1000 MG tablet TAKE 2 TABLETS AT ONSET OF COLD SORE. REPEAT ONCE IN 12 HOURS (4 PILLS/COURSE) (Patient not taking: Reported on 05/19/2023) 05/19/2023: As needed   No facility-administered encounter medications on file as of 05/19/2023.   Allergies  Allergen  Reactions   Demerol [Meperidine] Other (See Comments)    Low bp, passes out    ROS: no fever, chills, n/v/d, numbness, weakness, bruising   PHYSICAL EXAM:  BP 114/74   Pulse 68   Temp 98.1 F (36.7 C) (Tympanic)   Ht 5\' 6"  (1.676 m)   Wt 169 lb 12.8 oz (77 kg)   LMP 11/19/2007   BMI 27.41 kg/m   Wt Readings from Last 3 Encounters:  05/19/23 169 lb 12.8 oz (77 kg)  05/14/23 164 lb (74.4 kg)  03/19/23 174 lb (78.9 kg)    At the center portion of L heel, there is a 0.5 cm area that is slightly discolored/raised/hyperkeratotic. It is very sensitive to touch directly over this area. Nontender elsewhere. Normal pulses No  erythema, edema. No crusting or drainage  ASSESSMENT/PLAN:  Pain of left heel - suspect foreign body.  No e/o infection  Area was cleansed with alcohol, anesthetized with 1% lidocaine. 11 blade was used to shave down the hyperkeratotic area/skin. Tip of blade inserted into the opening from the wound and explored some. Patient didn't tolerate this very well.  Unable to feel (or see) presence of FB to extract.  Given the tenderness in the focal area, it is likely that a small piece is still present.  Overlying dead skin was pared down.  Encouraged warm soaks TID to see if that allows FB to come to surface. Discussed natural course. If persistent/worsening pain, may need better anesthesia and deeper exploration. Reassured no e/o infection, and reviewed s/sx infection and to return if they develop.

## 2023-05-26 NOTE — Progress Notes (Signed)
[name] [address] [place] [address] Phone: [phone] Subjective:   Bruce Donath, am serving as a scribe for Dr. Antoine Primas.  I'm seeing this patient by the request  of:  Joselyn Arrow, MD  CC: right knee pain   [id number]  01/20/2023 Intermittent pain that seems to be more in the patellofemoral and does have some underlying medial joint space narrowing as well.  Does respond relatively well though to the conservative therapy but at this point with her not making significant improvements we did do an injection today.  Hopeful that this will make significant improvement long-term.  Discussed icing regimen and home exercises, discussed which activities should be potentially avoided initially.  Concerned that some of this could be secondary to the range of motion of her ankle as well that we will need to continue to monitor.  Discussed proper shoe choices.  Follow-up again in 6 to 8 weeks.    Update 05/27/2023 Rachel Juarez is a 67 y.o. female coming in with complaint of R knee pain. Patient states that her R knee is the same as last visit.   In L knee she feels like a band of tight tissue is over the patella. Intermittent symptoms.   R shoulder pain in anterior aspect for past month. Hurts when active and when she lies on her side. Sometimes has radiating symptoms into upper arm.     Past Medical History:  Diagnosis Date   Allergy    Anxiety    Depression, major, in remission (HCC) 2006   when daughter dx'd with autoimmune disorder   Family history of breast cancer    Family history of cancer tonsil    Family history of thyroid cancer    Generalized anxiety disorder    generalized, and related to mammograms and flying   Glaucoma suspect    sees Dr. Dione Booze, has all been okay, sees him qoyr   Herpes labialis    Hypercholesterolemia    high HDL, borderline LDL   Hypothyroidism    treated by Dr. Talmage Nap   Multinodular goiter     followed by Dr. 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 Ranae Pila   KNEE ARTHROSCOPY Right 02/21/2022   for torn medial meniscus (Dr. Jerl Santos)   Social History   Socioeconomic History   Marital status: Married    Spouse name: Not on file   Number of children: Not on file   Years of education: Not on file   Highest education level: Not on file  Occupational History   Occupation: yoga teacher  Tobacco Use   Smoking status: Never   Smokeless tobacco: Never  Vaping Use   Vaping Use: Never used  Substance and Sexual Activity   Alcohol use: Yes    Comment: 1-2 Vodka sodas/day   Drug use: No   Sexual activity: Yes    Partners: Male    Birth control/protection: Post-menopausal  Other Topics Concern   Not on file  Social History Narrative   Pre-COVID, taught yoga at multiple places.    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 1 dog (Malti[employer]poo)   Lives with her husband, and she has a daughter, [name]--graduated from  [employer], married, lives in [place], works as an Scientist, forensic at [employer].   Volunteers at the [employer]      Updated 02/2023   Social Determinants of Health   Financial Resource Strain: Not on file  Food Insecurity: Not on file  Transportation Needs: Not on file  Physical Activity: Not on file  Stress: Not on file  Social Connections: Not on file   Allergies  Allergen Reactions   Demerol [Meperidine] Other (See Comments)    Low bp, passes out   Family History  Problem Relation Age of Onset   Lupus Mother    Cancer Father 33       thymic cancer   COPD Father    Heart disease Father        CHF   Asthma Father    Cancer Sister    Breast cancer Sister 27       "best kind", lumpectomy and radiation   Dermatomyositis Daughter 45        juvenile form   Irritable bowel syndrome Daughter    Colon cancer Maternal Grandmother        80's   Cancer Maternal Grandmother    Hypertension Paternal Grandmother    Hyperlipidemia Paternal Grandmother    Thyroid disease Paternal Grandmother        had goiter removed   Stroke Paternal Grandmother 31   Hypertension Paternal Grandfather    Hyperlipidemia Paternal Grandfather    Heart disease Paternal Grandfather        MI first in his 59's   Stroke Paternal Grandfather    Cancer Paternal Uncle        started on tonsils (smoker)   Breast cancer Paternal Aunt 80   Breast cancer Cousin 53   Breast cancer Cousin 59       (the mother of the 61 y/o cousin with br ca   Thyroid cancer Cousin    Diabetes Neg Hx    Ovarian cancer Neg Hx     Current Outpatient Medications (Endocrine & Metabolic):    levothyroxine (SYNTHROID, LEVOTHROID) 75 MCG tablet, Take 75 mcg by mouth daily before breakfast.  Current Outpatient Medications (Cardiovascular):    rosuvastatin (CRESTOR) 10 MG tablet, TAKE 1 TABLET BY MOUTH EVERY DAY  Current Outpatient Medications (Respiratory):    cetirizine (ZYRTEC) 10 MG tablet, Take 10 mg by mouth daily.   fluticasone (FLONASE) 50 MCG/ACT nasal spray, Place 2 sprays into both nostrils daily.   montelukast (SINGULAIR) 10 MG tablet, TAKE 1 TABLET BY MOUTH EVERYDAY AT BEDTIME    Current Outpatient Medications (Other):    ALPRAZolam (XANAX) 0.5 MG tablet, Take 0.5-1 tablets (0.25-0.5 mg total) by mouth 3 (three) times daily as needed for anxiety or sleep.   b complex vitamins capsule, Take 1 capsule by mouth daily.   busPIRone (BUSPAR) 15 MG tablet, Take 1 tablet (15 mg total) by mouth 2 (two) times daily.   Calcium 250 MG CAPS, Take 1-2 capsules by mouth daily.   cholecalciferol (VITAMIN D3) 25 MCG (1000 UNIT) tablet, Take 1,000 Units by mouth daily.   hydrocortisone 2.5 % ointment, Apply topically 2 (two) times daily.   Magnesium 500 MG TABS, Take 1 tablet by  mouth daily.   MILK THISTLE PO, Take 1 capsule by mouth daily.   Misc Natural Products (TART CHERRY ADVANCED PO), Take 1 capsule by mouth daily.   mupirocin ointment (BACTROBAN) 2 %, Apply 1 application topically 2 (two) times daily.   NON  FORMULARY, Take 2 capsules by mouth daily.   NON FORMULARY, Take 2 capsules by mouth daily.   Omega-3 Fatty Acids (FISH OIL) 1000 MG CPDR, Take 2,000 mg by mouth daily.   tamoxifen (NOLVADEX) 10 MG tablet, TAKE 1/2 TABLET BY MOUTH DAILY   triamcinolone cream (KENALOG) 0.5 %, Apply topically 3 (three) times daily.   valACYclovir (VALTREX) 1000 MG tablet, TAKE 2 TABLETS AT ONSET OF COLD SORE. REPEAT ONCE IN 12 HOURS (4 PILLS/COURSE)   venlafaxine XR (EFFEXOR XR) 150 MG 24 hr capsule, Take 1 capsule (150 mg total) by mouth daily with breakfast.   vitamin C (ASCORBIC ACID) 500 MG tablet, Take 500 mg by mouth daily.   Reviewed prior external information including notes and imaging from  primary care provider As well as notes that were available from care everywhere and other healthcare systems.  Past medical history, social, surgical and family history all reviewed in electronic medical record.  No pertanent information unless stated regarding to the chief complaint.   Review of Systems:  No headache, visual changes, nausea, vomiting, diarrhea, constipation, dizziness, abdominal pain, skin rash, fevers, chills, night sweats, weight loss, swollen lymph nodes,chest pain, shortness of breath, mood changes. POSITIVE muscle aches, body aches, joint swelling   Objective  Blood pressure 110/82, pulse 64, height 5\' 6"  (1.676 m), weight 169 lb (76.7 kg), last menstrual period 11/19/2007, SpO2 97 %.   General: No apparent distress alert and oriented x3 mood and affect normal, dressed appropriately.  HEENT: Pupils equal, extraocular movements intact  Respiratory: Patient's speak in full sentences and does not appear short of breath  Cardiovascular: No lower extremity  edema, non tender, no erythema  Bilateral knees do have trace effusion noted.  No crepitus noted of the knees bilaterally as well. Right shoulder positive cross over, some mild increase in pain in the empty can discussed HEP   Limited muscular skeletal ultrasound was performed and interpreted by Antoine Primas, M   Limited ultrasound shows the patient does have hypoechoic changes in the bicep tendon.  Patient's anterior labral Does have some abnormality noted as well.  Patient does have some degenerative changes of the RTC Impression: Patient does have degenerative changes of the rotator cuff.  Bicep tendinitis noted as well    After informed written and verbal consent, patient was seated on exam table. Right knee was prepped with alcohol swab and utilizing anterolateral approach, patient's right knee space was injected with 4:1  marcaine 0.5%: Kenalog 40mg /dL. Patient tolerated the procedure well without immediate complications.  After informed written and verbal consent, patient was seated on exam table. Left knee was prepped with alcohol swab and utilizing anterolateral approach, patient's left knee space was injected with 4:1  marcaine 0.5%: Kenalog 40mg /dL. Patient tolerated the procedure well without immediate complications.   Impression and Recommendations:     The above documentation has been reviewed and is accurate and complete Judi Saa, DO

## 2023-05-27 ENCOUNTER — Other Ambulatory Visit: Payer: Self-pay

## 2023-05-27 ENCOUNTER — Ambulatory Visit: Payer: Medicare Other | Admitting: Family Medicine

## 2023-05-27 ENCOUNTER — Encounter: Payer: Self-pay | Admitting: Family Medicine

## 2023-05-27 VITALS — BP 110/82 | HR 64 | Ht 66.0 in | Wt 169.0 lb

## 2023-05-27 DIAGNOSIS — M75111 Incomplete rotator cuff tear or rupture of right shoulder, not specified as traumatic: Secondary | ICD-10-CM

## 2023-05-27 DIAGNOSIS — M170 Bilateral primary osteoarthritis of knee: Secondary | ICD-10-CM | POA: Insufficient documentation

## 2023-05-27 DIAGNOSIS — M25561 Pain in right knee: Secondary | ICD-10-CM

## 2023-05-27 DIAGNOSIS — G8929 Other chronic pain: Secondary | ICD-10-CM

## 2023-05-27 NOTE — Assessment & Plan Note (Signed)
Degenerative changes bilaterally.  Discussed icing regimen and home exercises, discussed which activities to do and which ones to avoid.  Increase activity slowly.  Follow-up again in 6 to 8 weeks could be a candidate for viscosupplementation if needed.

## 2023-05-27 NOTE — Patient Instructions (Signed)
See me in 2-3 months  Injected both knees today Bicep tendonitis Arm compression with working out Hands in peripheral vision

## 2023-05-27 NOTE — Assessment & Plan Note (Signed)
Hx of difficulty  Has now bicep tendonitis.  Discussed compression.  HEP 6 weeks

## 2023-06-15 ENCOUNTER — Other Ambulatory Visit: Payer: Self-pay | Admitting: Family Medicine

## 2023-06-15 DIAGNOSIS — E7800 Pure hypercholesterolemia, unspecified: Secondary | ICD-10-CM

## 2023-07-27 ENCOUNTER — Encounter: Payer: Self-pay | Admitting: Family Medicine

## 2023-08-11 NOTE — Progress Notes (Unsigned)
[facility] [address] [place] [address] Phone: [phone] Subjective:   Bruce Donath, am serving as a scribe for Dr. Antoine Primas.  I'm seeing this patient by the request  of:  Joselyn Arrow, MD  CC: Bilateral knee and right shoulder pain  [id number]  05/27/2023 Degenerative changes bilaterally.  Discussed icing regimen and home exercises, discussed which activities to do and which ones to avoid.  Increase activity slowly.  Follow-up again in 6 to 8 weeks could be a candidate for viscosupplementation if needed.     Update 08/12/2023 Rachel Juarez is a 67 y.o. female coming in with complaint of R knee pain and R shoulder pain.  At last visit patient was found to have a bicep tendinitis as well as degenerative changes of the rotator cuff.  Regarding patient's knees does have known arthritic changes and given injections at last exam.  Patient states that she has constant, achy pain.   Pain in knee will be a pulling sensation. Modifies workouts.        Past Medical History:  Diagnosis Date   Allergy    Anxiety    Depression, major, in remission (HCC) 2006   when daughter dx'd with autoimmune disorder   Family history of breast cancer    Family history of cancer tonsil    Family history of thyroid cancer    Generalized anxiety disorder    generalized, and related to mammograms and flying   Glaucoma suspect    sees Dr. Dione Booze, has all been okay, sees him qoyr   Herpes labialis    Hypercholesterolemia    high HDL, borderline LDL   Hypothyroidism    treated by Dr. Talmage Nap   Multinodular goiter    followed by Dr. 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 Ranae Pila   KNEE ARTHROSCOPY Right 02/21/2022   for torn medial meniscus  (Dr. Jerl Santos)   Social History   Socioeconomic History   Marital status: Married    Spouse name: Not on file   Number of children: Not on file   Years of education: Not on file   Highest education level: Not on file  Occupational History   Occupation: yoga teacher  Tobacco Use   Smoking status: Never   Smokeless tobacco: Never  Vaping Use   Vaping status: Never Used  Substance and Sexual Activity   Alcohol use: Yes    Comment: 1-2 Vodka sodas/day   Drug use: No   Sexual activity: Yes    Partners: Male    Birth control/protection: Post-menopausal  Other Topics Concern   Not on file  Social History Narrative   Pre-COVID, taught yoga at multiple places.    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 1 dog ([employer])   Lives with her husband, and she has a daughter, [name]--graduated from [employer], married, lives in [place], works as an Scientist, forensic at [employer].   Volunteers at the [employer]      Updated 02/2023   Social Determinants of Health   Financial Resource Strain: Not on file  Food Insecurity: Not on file  Transportation Needs: Not on file  Physical Activity: Not on file  Stress: Not on  file  Social Connections: Not on file   Allergies  Allergen Reactions   Demerol [Meperidine] Other (See Comments)    Low bp, passes out   Family History  Problem Relation Age of Onset   Lupus Mother    Cancer Father 67       thymic cancer   COPD Father    Heart disease Father        CHF   Asthma Father    Cancer Sister    Breast cancer Sister 66       "best kind", lumpectomy and radiation   Dermatomyositis Daughter 61       juvenile form   Irritable bowel syndrome Daughter    Colon cancer Maternal Grandmother        80's   Cancer Maternal Grandmother    Hypertension Paternal Grandmother    Hyperlipidemia Paternal Grandmother    Thyroid disease Paternal Grandmother        had goiter removed   Stroke Paternal Grandmother 33   Hypertension Paternal  Grandfather    Hyperlipidemia Paternal Grandfather    Heart disease Paternal Grandfather        MI first in his 48's   Stroke Paternal Grandfather    Cancer Paternal Uncle        started on tonsils (smoker)   Breast cancer Paternal Aunt 61   Breast cancer Cousin 7   Breast cancer Cousin 23       (the mother of the 97 y/o cousin with br ca   Thyroid cancer Cousin    Diabetes Neg Hx    Ovarian cancer Neg Hx     Current Outpatient Medications (Endocrine & Metabolic):    levothyroxine (SYNTHROID, LEVOTHROID) 75 MCG tablet, Take 75 mcg by mouth daily before breakfast.  Current Outpatient Medications (Cardiovascular):    rosuvastatin (CRESTOR) 10 MG tablet, TAKE 1 TABLET BY MOUTH EVERY DAY  Current Outpatient Medications (Respiratory):    cetirizine (ZYRTEC) 10 MG tablet, Take 10 mg by mouth daily.   fluticasone (FLONASE) 50 MCG/ACT nasal spray, Place 2 sprays into both nostrils daily.   montelukast (SINGULAIR) 10 MG tablet, TAKE 1 TABLET BY MOUTH EVERYDAY AT BEDTIME    Current Outpatient Medications (Other):    ALPRAZolam (XANAX) 0.5 MG tablet, Take 0.5-1 tablets (0.25-0.5 mg total) by mouth 3 (three) times daily as needed for anxiety or sleep.   b complex vitamins capsule, Take 1 capsule by mouth daily.   busPIRone (BUSPAR) 15 MG tablet, Take 1 tablet (15 mg total) by mouth 2 (two) times daily.   Calcium 250 MG CAPS, Take 1-2 capsules by mouth daily.   cholecalciferol (VITAMIN D3) 25 MCG (1000 UNIT) tablet, Take 1,000 Units by mouth daily.   hydrocortisone 2.5 % ointment, Apply topically 2 (two) times daily.   Magnesium 500 MG TABS, Take 1 tablet by mouth daily.   MILK THISTLE PO, Take 1 capsule by mouth daily.   Misc Natural Products (TART CHERRY ADVANCED PO), Take 1 capsule by mouth daily.   mupirocin ointment (BACTROBAN) 2 %, Apply 1 application topically 2 (two) times daily.   NON FORMULARY, Take 2 capsules by mouth daily.   NON FORMULARY, Take 2 capsules by mouth daily.    Omega-3 Fatty Acids (FISH OIL) 1000 MG CPDR, Take 2,000 mg by mouth daily.   tamoxifen (NOLVADEX) 10 MG tablet, TAKE 1/2 TABLET BY MOUTH DAILY   triamcinolone cream (KENALOG) 0.5 %, Apply topically 3 (three) times daily.   valACYclovir (VALTREX)  1000 MG tablet, TAKE 2 TABLETS AT ONSET OF COLD SORE. REPEAT ONCE IN 12 HOURS (4 PILLS/COURSE)   venlafaxine XR (EFFEXOR XR) 150 MG 24 hr capsule, Take 1 capsule (150 mg total) by mouth daily with breakfast.   vitamin C (ASCORBIC ACID) 500 MG tablet, Take 500 mg by mouth daily.   Reviewed prior external information including notes and imaging from  primary care provider As well as notes that were available from care everywhere and other healthcare systems.  Past medical history, social, surgical and family history all reviewed in electronic medical record.  No pertanent information unless stated regarding to the chief complaint.   Review of Systems:  No headache, visual changes, nausea, vomiting, diarrhea, constipation, dizziness, abdominal pain, skin rash, fevers, chills, night sweats, weight loss, swollen lymph nodes, body aches, joint swelling, chest pain, shortness of breath, mood changes. POSITIVE muscle aches  Objective  Blood pressure 130/78, pulse (!) 111, height 5\' 6"  (1.676 m), weight 169 lb (76.7 kg), last menstrual period 11/19/2007, SpO2 98%.   General: No apparent distress alert and oriented x3 mood and affect normal, dressed appropriately.  HEENT: Pupils equal, extraocular movements intact  Respiratory: Patient's speak in full sentences and does not appear short of breath  Cardiovascular: No lower extremity edema, non tender, no erythema  Patient's right shoulder does have a positive impingement noted.  Rotator cuff strength 3 out of 5.  This is different than previous exam.  Tenderness to palpation in the paraspinal musculature in the shoulder region.  Positive crossover and positive O'Brien's noted but not as severe as the empty can  test. \ Bilateral knees do have some crepitus noted.  Some tenderness to palpation over the medial joint line on the right side.  Limited muscular skeletal ultrasound was performed and interpreted by Antoine Primas, M   Limited ultrasound still shows that there is a rotator cuff injury noted of the supraspinatus.  Appears to have a near full-thickness tear noted at this time.  Seems to be subacute.  Impression: Subacute rotator cuff tear noted with some mild retraction of the supraspinatus.   Impression and Recommendations:    The above documentation has been reviewed and is accurate and complete Judi Saa, DO

## 2023-08-12 ENCOUNTER — Ambulatory Visit (INDEPENDENT_AMBULATORY_CARE_PROVIDER_SITE_OTHER): Payer: Medicare Other

## 2023-08-12 ENCOUNTER — Encounter: Payer: Self-pay | Admitting: Family Medicine

## 2023-08-12 ENCOUNTER — Ambulatory Visit (INDEPENDENT_AMBULATORY_CARE_PROVIDER_SITE_OTHER): Payer: Medicare Other | Admitting: Family Medicine

## 2023-08-12 ENCOUNTER — Other Ambulatory Visit: Payer: Self-pay

## 2023-08-12 VITALS — BP 130/78 | HR 111 | Ht 66.0 in | Wt 169.0 lb

## 2023-08-12 DIAGNOSIS — M170 Bilateral primary osteoarthritis of knee: Secondary | ICD-10-CM | POA: Diagnosis not present

## 2023-08-12 DIAGNOSIS — M75111 Incomplete rotator cuff tear or rupture of right shoulder, not specified as traumatic: Secondary | ICD-10-CM

## 2023-08-12 DIAGNOSIS — M25511 Pain in right shoulder: Secondary | ICD-10-CM

## 2023-08-12 NOTE — Assessment & Plan Note (Signed)
Stable, discussed HEP, discussed VMO strengthening discussed the possibility of viscosupplementation which patient did get approval for.  Patient would like to hold at this moment on that injection but would consider it in the future.  Discussed which activities to do and which ones to avoid otherwise.  Increase activity slowly.  Follow-up again in 6 to 8 weeks.

## 2023-08-12 NOTE — Patient Instructions (Addendum)
Xray today  [facility] [phone] Call Today  When we receive your results we will contact you.  Marland Kitchen

## 2023-08-12 NOTE — Assessment & Plan Note (Signed)
Unfortunately it appears the patient is having worsening weakness of this arm.  Concerned that this could be potentially causing more discomfort and pain again.  Patient has failed formal physical therapy, home exercises, icing regimen.  Do feel at this point that advanced imaging would be warranted.  Will get an MR arthrogram to further evaluate if there is any type of arthritic changes that could be potentially contributing.  Follow-up with me again in 6 to 8 weeks.  Depending on findings we will discuss if surgical intervention of the possibility of PRP will be helpful.

## 2023-08-27 ENCOUNTER — Encounter: Payer: Self-pay | Admitting: *Deleted

## 2023-08-27 ENCOUNTER — Encounter: Payer: Self-pay | Admitting: Family Medicine

## 2023-08-29 NOTE — Progress Notes (Unsigned)
[name] [address] [place] [address] Phone: [phone] Subjective:   Rachel Juarez, am serving as a scribe for Dr. Antoine Primas.  I'm seeing this patient by the request  of:  Joselyn Arrow, MD  CC: Right knee follow-up  [id number]  08/12/2023 Unfortunately it appears the patient is having worsening weakness of this arm. Concerned that this could be potentially causing more discomfort and pain again. Patient has failed formal physical therapy, home exercises, icing regimen. Do feel at this point that advanced imaging would be warranted. Will get an MR arthrogram to further evaluate if there is any type of arthritic changes that could be potentially contributing. Follow-up with me again in 6 to 8 weeks. Depending on findings we will discuss if surgical intervention of the possibility of PRP will be helpful.   Stable, discussed HEP, discussed VMO strengthening discussed the possibility of viscosupplementation which patient did get approval for.  Patient would like to hold at this moment on that injection but would consider it in the future.  Discussed which activities to do and which ones to avoid otherwise.  Increase activity slowly.  Follow-up again in 6 to 8 weeks.   Updated 09/01/2023 Rachel Juarez is a 67 y.o. female coming in with complaint of shoulder and knee pain. Patient states that her left knee is good. Here for gel injection in R knee.   R shoulder pain has decreased.        Past Medical History:  Diagnosis Date   Allergy    Anxiety    Depression, major, in remission (HCC) 2006   when daughter dx'd with autoimmune disorder   Family history of breast cancer    Family history of cancer tonsil    Family history of thyroid cancer    Generalized anxiety disorder    generalized, and related to mammograms and flying   Glaucoma suspect    sees Dr. Dione Booze, has all been okay, sees him qoyr   Herpes labialis     Hypercholesterolemia    high HDL, borderline LDL   Hypothyroidism    treated by Dr. Talmage Nap   Multinodular goiter    followed by Dr. 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 Ranae Pila   KNEE ARTHROSCOPY Right 02/21/2022   for torn medial meniscus (Dr. Jerl Santos)   Social History   Socioeconomic History   Marital status: Married    Spouse name: Not on file   Number of children: Not on file   Years of education: Not on file   Highest education level: Not on file  Occupational History   Occupation: yoga teacher  Tobacco Use   Smoking status: Never   Smokeless tobacco: Never  Vaping Use   Vaping status: Never Used  Substance and Sexual Activity   Alcohol use: Yes    Comment: 1-2 Vodka sodas/day   Drug use: No   Sexual activity: Yes    Partners: Male    Birth control/protection: Post-menopausal  Other Topics Concern   Not on file  Social History Narrative   Pre-COVID, taught yoga at multiple places.    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 1 dog (Malti-poo)   Lives with her  husband, and she has a daughter, [name]--graduated from [employer], married, lives in [place], works as an Scientist, forensic at [employer].   Volunteers at the [employer]      Updated 02/2023   Social Determinants of Health   Financial Resource Strain: Not on file  Food Insecurity: Not on file  Transportation Needs: Not on file  Physical Activity: Not on file  Stress: Not on file  Social Connections: Not on file   Allergies  Allergen Reactions   Demerol [Meperidine] Other (See Comments)    Low bp, passes out   Family History  Problem Relation Age of Onset   Lupus Mother    Cancer Father 52       thymic cancer   COPD Father    Heart disease Father        CHF   Asthma Father    Cancer  Sister    Breast cancer Sister 87       "best kind", lumpectomy and radiation   Dermatomyositis Daughter 33       juvenile form   Irritable bowel syndrome Daughter    Colon cancer Maternal Grandmother        80's   Cancer Maternal Grandmother    Hypertension Paternal Grandmother    Hyperlipidemia Paternal Grandmother    Thyroid disease Paternal Grandmother        had goiter removed   Stroke Paternal Grandmother 62   Hypertension Paternal Grandfather    Hyperlipidemia Paternal Grandfather    Heart disease Paternal Grandfather        MI first in his 61's   Stroke Paternal Grandfather    Cancer Paternal Uncle        started on tonsils (smoker)   Breast cancer Paternal Aunt 53   Breast cancer Cousin 3   Breast cancer Cousin 61       (the mother of the 39 y/o cousin with br ca   Thyroid cancer Cousin    Diabetes Neg Hx    Ovarian cancer Neg Hx     Current Outpatient Medications (Endocrine & Metabolic):    levothyroxine (SYNTHROID, LEVOTHROID) 75 MCG tablet, Take 75 mcg by mouth daily before breakfast.  Current Outpatient Medications (Cardiovascular):    rosuvastatin (CRESTOR) 10 MG tablet, TAKE 1 TABLET BY MOUTH EVERY DAY  Current Outpatient Medications (Respiratory):    cetirizine (ZYRTEC) 10 MG tablet, Take 10 mg by mouth daily.   fluticasone (FLONASE) 50 MCG/ACT nasal spray, Place 2 sprays into both nostrils daily.   montelukast (SINGULAIR) 10 MG tablet, TAKE 1 TABLET BY MOUTH EVERYDAY AT BEDTIME    Current Outpatient Medications (Other):    ALPRAZolam (XANAX) 0.5 MG tablet, Take 0.5-1 tablets (0.25-0.5 mg total) by mouth 3 (three) times daily as needed for anxiety or sleep.   b complex vitamins capsule, Take 1 capsule by mouth daily.   busPIRone (BUSPAR) 15 MG tablet, Take 1 tablet (15 mg total) by mouth 2 (two) times daily.   Calcium 250 MG CAPS, Take 1-2 capsules by mouth daily.   cholecalciferol (VITAMIN D3) 25 MCG (1000 UNIT) tablet, Take 1,000 Units by mouth  daily.   hydrocortisone 2.5 % ointment, Apply topically 2 (two) times daily.   Magnesium 500 MG TABS, Take 1 tablet by mouth daily.   MILK THISTLE PO, Take 1 capsule by mouth daily.   Misc Natural Products (TART CHERRY ADVANCED PO), Take 1 capsule by mouth daily.   mupirocin ointment (BACTROBAN) 2 %, Apply 1 application  topically 2 (two) times daily.   NON FORMULARY, Take 2 capsules by mouth daily.   NON FORMULARY, Take 2 capsules by mouth daily.   Omega-3 Fatty Acids (FISH OIL) 1000 MG CPDR, Take 2,000 mg by mouth daily.   tamoxifen (NOLVADEX) 10 MG tablet, TAKE 1/2 TABLET BY MOUTH DAILY   triamcinolone cream (KENALOG) 0.5 %, Apply topically 3 (three) times daily.   valACYclovir (VALTREX) 1000 MG tablet, TAKE 2 TABLETS AT ONSET OF COLD SORE. REPEAT ONCE IN 12 HOURS (4 PILLS/COURSE)   venlafaxine XR (EFFEXOR XR) 150 MG 24 hr capsule, Take 1 capsule (150 mg total) by mouth daily with breakfast.   vitamin C (ASCORBIC ACID) 500 MG tablet, Take 500 mg by mouth daily.   Reviewed prior external information including notes and imaging from  primary care provider As well as notes that were available from care everywhere and other healthcare systems.  Past medical history, social, surgical and family history all reviewed in electronic medical record.  No pertanent information unless stated regarding to the chief complaint.   Review of Systems:  No headache, visual changes, nausea, vomiting, diarrhea, constipation, dizziness, abdominal pain, skin rash, fevers, chills, night sweats, weight loss, swollen lymph nodes, body aches, joint swelling, chest pain, shortness of breath, mood changes. POSITIVE muscle aches  Objective  Blood pressure 108/72, pulse 72, height 5\' 6"  (1.676 m), weight 166 lb (75.3 kg), last menstrual period 11/19/2007, SpO2 98%.   General: No apparent distress alert and oriented x3 mood and affect normal, dressed appropriately.  HEENT: Pupils equal, extraocular movements intact   Respiratory: Patient's speak in full sentences and does not appear short of breath  Cardiovascular: No lower extremity edema, non tender, no erythema  Patient does have arthritic changes noted of the right knee.  Trace effusion noted.  Patient does lack the last 10 degrees of flexion.  After informed written and verbal consent, patient was seated on exam table. Right knee was prepped with alcohol swab and utilizing anterolateral approach, patient's right knee space was injected with 48 mg per 3 mL of Monovisc (sodium hyaluronate) in a prefilled syringe was injected easily into the knee through a 22-gauge needle..Patient tolerated the procedure well without immediate complications.    Impression and Recommendations:    The above documentation has been reviewed and is accurate and complete Judi Saa, DO

## 2023-09-01 ENCOUNTER — Encounter: Payer: Self-pay | Admitting: Family Medicine

## 2023-09-01 ENCOUNTER — Ambulatory Visit: Payer: Medicare Other | Admitting: Family Medicine

## 2023-09-01 VITALS — BP 108/72 | HR 72 | Ht 66.0 in | Wt 166.0 lb

## 2023-09-01 DIAGNOSIS — M170 Bilateral primary osteoarthritis of knee: Secondary | ICD-10-CM | POA: Diagnosis not present

## 2023-09-01 DIAGNOSIS — M25561 Pain in right knee: Secondary | ICD-10-CM | POA: Diagnosis not present

## 2023-09-01 MED ORDER — HYALURONAN 88 MG/4ML IX SOSY
88.0000 mg | PREFILLED_SYRINGE | Freq: Once | INTRA_ARTICULAR | Status: AC
Start: 2023-09-01 — End: 2023-09-01
  Administered 2023-09-01: 88 mg via INTRA_ARTICULAR

## 2023-09-01 NOTE — Assessment & Plan Note (Addendum)
Visco given today in the right knee.  Did well to this.  Discussed home exercises and icing regimen.  Continue to work on weight loss.  Follow-up again in 6 to 8 weeks

## 2023-09-01 NOTE — Patient Instructions (Signed)
Injected knee today with Monovisc Will see what MRI says See me again in

## 2023-09-04 ENCOUNTER — Encounter: Payer: Self-pay | Admitting: Hematology and Oncology

## 2023-09-05 ENCOUNTER — Other Ambulatory Visit: Payer: BLUE CROSS/BLUE SHIELD

## 2023-09-08 ENCOUNTER — Ambulatory Visit
Admission: RE | Admit: 2023-09-08 | Discharge: 2023-09-08 | Disposition: A | Discharge status: Home / Self Care | Payer: Medicare Other | Source: Ambulatory Visit | Attending: Hematology and Oncology | Admitting: Hematology and Oncology

## 2023-09-08 DIAGNOSIS — Z9189 Other specified personal risk factors, not elsewhere classified: Secondary | ICD-10-CM

## 2023-09-08 MED ORDER — GADOPICLENOL 0.5 MMOL/ML IV SOLN
7.5000 mL | Freq: Once | INTRAVENOUS | Status: AC | PRN
  Administered 2023-09-08: 7.5 mL via INTRAVENOUS

## 2023-09-09 ENCOUNTER — Telehealth: Payer: Self-pay

## 2023-09-09 ENCOUNTER — Encounter: Payer: Self-pay | Admitting: Family Medicine

## 2023-09-09 DIAGNOSIS — F419 Anxiety disorder, unspecified: Secondary | ICD-10-CM

## 2023-09-09 MED ORDER — ALPRAZOLAM 0.5 MG PO TABS: 0.2500 mg | ORAL_TABLET | Freq: Three times a day (TID) | ORAL | 0 refills | Status: DC | PRN

## 2023-09-09 NOTE — Telephone Encounter (Signed)
Pt returned call. I let pt know about breast negative results. Pt verbalized and understanding.

## 2023-09-09 NOTE — Telephone Encounter (Signed)
-----   Message from Noreene Filbert sent at 09/08/2023  4:24 PM EDT ----- Rachel Juarez give patient the good results that her MRI of the breast is negative. ----- Message ----- From: Interface, Rad Results In Sent: 09/08/2023   1:45 PM EDT To: Serena Croissant, MD

## 2023-09-09 NOTE — Telephone Encounter (Signed)
Left VM for patient to return call back to receive MRI results from [place].

## 2023-09-19 ENCOUNTER — Ambulatory Visit
Admission: RE | Admit: 2023-09-19 | Discharge: 2023-09-19 | Disposition: A | Discharge status: Home / Self Care | Payer: Medicare Other | Source: Ambulatory Visit | Attending: Family Medicine | Admitting: Family Medicine

## 2023-09-19 DIAGNOSIS — M25511 Pain in right shoulder: Secondary | ICD-10-CM

## 2023-09-19 MED ORDER — IOPAMIDOL (ISOVUE-M 200) INJECTION 41%
10.0000 mL | Freq: Once | INTRAMUSCULAR | Status: AC
  Administered 2023-09-19: 10 mL via INTRA_ARTICULAR

## 2023-09-21 NOTE — Progress Notes (Unsigned)
No chief complaint on file.  Patient presents for f/u on chronic problems.  She had gel injection to right knee on 10/4 by Dr. Terrilee Files. She saw him for R shoulder pain in September. Xrays showed acromioclavicular degenerative changes. No acute osseous abnormalities. She had fluoro-guided injection on 10/25, as well as MRI, which has not been read by radiology yet.   Depression and anxiety: In May 2024 her Effexor dose was increased to 150mg  due to feeling flat, unmotivated, irritable. At her f/u in June she reported having more energy and motivation to do things, without forcing herself.  She denies side effects. She started on effexor in mid-February (having switched from citalopram; she stopped Wellbutrin when she started on Tamoxifen).  She continues on Buspar 15mg  BID for anxiety, and reports this is also working well. Seeing therapist regularly.  Hyperlipidemia:  Patient is compliant with taking rosuvastatin 10mg  daily, and is tolerating this without side effects.  She continues to try and limit her cheese, eats red meat once a week, french fries (less often than in the past, max 2x/week), and no other fried foods, occasional eggs (only when out, <1 week). Lipids were at goal on this regimen, due for recheck.   Lab Results  Component Value Date   CHOL 182 03/19/2023   HDL 65 03/19/2023   LDLCALC 96 03/19/2023   TRIG 120 03/19/2023   CHOLHDL 2.8 03/19/2023   One liver enzyme was noted to be elevated on last check.   Lab Results  Component Value Date   ALT 48 (H) 03/19/2023   AST 31 03/19/2023   ALKPHOS 60 03/19/2023   BILITOT 0.4 03/19/2023    Increased risk for breast cancer:  She is currently on 5mg  of tamoxifen, with plans to take it for 5 years (started 11/2021). This is for prevention of breast cancer, under the care of Dr. Pamelia Hoit. She has mild hot flashes, no worse than she had prior to starting therapy.  She denies any breast concerns. She denies any vaginal  bleeding or spotting. She had normal breast MRI done 2 weeks ago.    H/o Vitamin D deficiency--Level was slightly low at 29.8 on last check in 02/2023, when she hadn't been taking her MVI regularly.  Prior level had been 34.6 in 01/2022 when taking daily MVI. At that time she was advised to take an additional 1000 IU of D3 October through March/April, and continue MVI daily.  She is currently taking    Hypothyroidism/multinodular goiter: She is under the care of Dr. Talmage Nap.  Denies any changes to hair/skin/bowels/nails, energy. Recent labs by Dr. Talmage Nap ??***  Lab Results  Component Value Date   TSH 0.750 03/19/2023      Allergies: Year-round, with seasonal component in Spring/Fall. She is doing well on singulair, flonase and zyrtec daily, along with OTC eye drops.     Herpes labialis--uses Valtrex prn.   Last filled in 02/2023, #28. Medication is effective. Flares are infrequent.    PMH, PSH, SH reviewed    ROS:  no fever, chills, URI symptoms.  No dizziness, headaches, chest pain, shortness of breath, edema.. Mild hot flashes, tolerable. Allergies are controlled with current regimen. Moods per HPI.     PHYSICAL EXAM:  LMP 11/19/2007   Wt Readings from Last 3 Encounters:  09/01/23 166 lb (75.3 kg)  08/12/23 169 lb (76.7 kg)  05/27/23 169 lb (76.7 kg)   Pleasant, well-appearing female, in no distress HEENT: conjunctiva and sclera are clear, EOMI. OP  clear Neck: no lymphadenopathy or mass Heart: regular rate and rhythm Lungs: clear bilaterally Back: no spinal or CVA tenderness Abdomen: soft, nontender, no organomegaly or mass Extremities: no edema, normal pulses. Neuro: alert and oriented. Cranial nerves grossly intact. Normal gait. Psych: Normal mood, affect, hygiene and grooming    ASSESSMENT/PLAN:  This is not her physical. She only needs LFT's done bc one was elevated at her April check. D only if noncompliant with supplements. Doesn't need c-met or  cbc   She had COVID in early September, can delay vaccine  GAD-7/phq-9 only if worsening anxiety/depression  Taking MVI daily? Time to start additional 1000 IU of D3 if she hasn't already  Was thyroid dose adjusted by Balan? Had level checked recently? (Something in CareEverywhere, no result)   F/u as scheduled in April for AWV, initial

## 2023-09-22 ENCOUNTER — Ambulatory Visit (INDEPENDENT_AMBULATORY_CARE_PROVIDER_SITE_OTHER): Payer: Medicare Other | Admitting: Family Medicine

## 2023-09-22 ENCOUNTER — Encounter: Payer: Self-pay | Admitting: Family Medicine

## 2023-09-22 VITALS — BP 112/70 | HR 92 | Ht 66.0 in | Wt 165.6 lb

## 2023-09-22 DIAGNOSIS — E559 Vitamin D deficiency, unspecified: Secondary | ICD-10-CM | POA: Diagnosis not present

## 2023-09-22 DIAGNOSIS — F411 Generalized anxiety disorder: Secondary | ICD-10-CM

## 2023-09-22 DIAGNOSIS — J302 Other seasonal allergic rhinitis: Secondary | ICD-10-CM

## 2023-09-22 DIAGNOSIS — E7800 Pure hypercholesterolemia, unspecified: Secondary | ICD-10-CM

## 2023-09-22 DIAGNOSIS — R7989 Other specified abnormal findings of blood chemistry: Secondary | ICD-10-CM

## 2023-09-22 DIAGNOSIS — Z5181 Encounter for therapeutic drug level monitoring: Secondary | ICD-10-CM | POA: Diagnosis not present

## 2023-09-22 DIAGNOSIS — E039 Hypothyroidism, unspecified: Secondary | ICD-10-CM

## 2023-09-22 DIAGNOSIS — E042 Nontoxic multinodular goiter: Secondary | ICD-10-CM

## 2023-09-22 DIAGNOSIS — F325 Major depressive disorder, single episode, in full remission: Secondary | ICD-10-CM

## 2023-09-22 DIAGNOSIS — F321 Major depressive disorder, single episode, moderate: Secondary | ICD-10-CM

## 2023-09-22 MED ORDER — VENLAFAXINE HCL ER 150 MG PO CP24
150.0000 mg | ORAL_CAPSULE | Freq: Every day | ORAL | 1 refills | Status: DC
Start: 2023-09-22 — End: 2024-03-18

## 2023-09-22 NOTE — Patient Instructions (Signed)
Try taking your vitamins with your evening dose of Buspar. Let us know the dose of D3 that you are taking. I recommend 1000-2000 IU daily (2000 IU over the winter, if you can remember).Marland Kitchen

## 2023-09-23 ENCOUNTER — Encounter: Payer: Self-pay | Admitting: Family Medicine

## 2023-09-23 LAB — HEPATIC FUNCTION PANEL
ALT: 41 [IU]/L — ABNORMAL HIGH (ref 0–32)
AST: 26 [IU]/L (ref 0–40)
Albumin: 4.6 g/dL (ref 3.9–4.9)
Alkaline Phosphatase: 67 [IU]/L (ref 44–121)
Bilirubin Total: 0.4 mg/dL (ref 0.0–1.2)
Bilirubin, Direct: 0.13 mg/dL (ref 0.00–0.40)
Total Protein: 6.9 g/dL (ref 6.0–8.5)

## 2023-09-23 LAB — TSH: TSH: 0.875 u[IU]/mL (ref 0.450–4.500)

## 2023-09-29 ENCOUNTER — Encounter: Payer: Self-pay | Admitting: Family Medicine

## 2023-10-01 ENCOUNTER — Other Ambulatory Visit: Payer: Self-pay

## 2023-10-01 MED ORDER — PREDNISONE 20 MG PO TABS: 40.0000 mg | ORAL_TABLET | Freq: Every day | ORAL | 0 refills | Status: DC

## 2023-10-09 ENCOUNTER — Encounter: Payer: Self-pay | Admitting: Family Medicine

## 2023-10-09 NOTE — Progress Notes (Signed)
[name] [address] [place] [address] Phone: [phone] Subjective:   Rachel Juarez, am serving as a scribe for Dr. Antoine Primas.  I'm seeing this patient by the request  of:  Joselyn Arrow, MD  CC: knee pain follow up   [id number]  09/01/2023 Visco given today in the right knee.  Did well to this.  Discussed home exercises and icing regimen.  Continue to work on weight loss.  Follow-up again in 6 to 8 weeks     Updated 10/13/2023 Rachel Juarez is a 67 y.o. female coming in with complaint of R shoulder pain. Patient states that she is the same as last visit.  Patient did have an MRI of the shoulder that showed severe acromioclavicular arthritis with edema noted in the bone itself.  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 Mild anterior supraspinatus and superior subscapularis tendinosis. No rotator cuff tear. 2. Mild tendinosis of the proximal long head of the biceps tendon, proximal to the bicipital groove. 3. Moderate to severe degenerative changes of the acromioclavicular joint. This includes high-grade clavicular head subchondral marrow edema suggesting a source of pain. 4. Mild-to-moderate glenohumeral cartilage degenerative changes. 5. Benign chondroid matrix lesion within the posterior, slightly inferior humeral head. 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    Past Medical History:  Diagnosis Date   Allergy    Anxiety    Depression, major, in remission (HCC) 2006   when daughter dx'd with autoimmune disorder   Family history of breast cancer    Family history of cancer tonsil    Family history of thyroid cancer    Generalized anxiety disorder    generalized, and related to mammograms and flying   Glaucoma suspect    sees Dr. Dione Booze, has all been okay, sees him qoyr   Herpes labialis    Hypercholesterolemia    high HDL, borderline LDL   Hypothyroidism    treated by Dr. Talmage Nap   Multinodular goiter    followed  by Dr. 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 Ranae Pila   KNEE ARTHROSCOPY Right 02/21/2022   for torn medial meniscus (Dr. Jerl Santos)   Social History   Socioeconomic History   Marital status: Married    Spouse name: Not on file   Number of children: Not on file   Years of education: Not on file   Highest education level: Not on file  Occupational History   Occupation: yoga teacher  Tobacco Use   Smoking status: Never   Smokeless tobacco: Never  Vaping Use   Vaping status: Never Used  Substance and Sexual Activity   Alcohol use: Yes    Comment: 1-2 Vodka sodas/day   Drug use: No   Sexual activity: Yes    Partners: Male    Birth control/protection: Post-menopausal  Other Topics Concern   Not on file  Social History Narrative   Pre-COVID, taught yoga at multiple places.    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 1 dog ([place]poo)   Lives with her husband, and she has a daughter, [name]--graduated from [employer], married, lives in [place], works as an Scientist, forensic at [employer].   Volunteers at the [employer]  Updated 02/2023   Social Determinants of Health   Financial Resource Strain: Low Risk  (10/11/2023)   Received from [facility]   Overall Financial Resource Strain (CARDIA)    Difficulty of Paying Living Expenses: Not hard at all  Food Insecurity: No Food Insecurity (10/11/2023)   Received from [facility]   Hunger Vital Sign    Worried About Running Out of Food in the Last Year: Never true    Ran Out of Food in the Last Year: Never true  Transportation Needs: No Transportation Needs (10/11/2023)   Received from [facility] - Transportation    In the past 12 months, has lack of  transportation kept you from medical appointments or from getting medications?: No    Lack of Transportation (Non-Medical): No  Physical Activity: Sufficiently Active (09/22/2023)   Exercise Vital Sign    Days of Exercise per Week: 4 days    Minutes of Exercise per Session: 40 min  Stress: Stress Concern Present (09/22/2023)   [employer] of Occupational Health - Occupational Stress Questionnaire    Feeling of Stress : To some extent  Social Connections: Moderately Integrated (09/22/2023)   Social Connection and Isolation Panel [NHANES]    Frequency of Communication with Friends and Family: Once a week    Frequency of Social Gatherings with Friends and Family: Once a week    Attends Religious Services: 1 to 4 times per year    Active Member of [employer] or Organizations: Not on file    Attends Engineer, structural: More than 4 times per year    Marital Status: Married   Allergies  Allergen Reactions   Demerol [Meperidine] Other (See Comments)    Low bp, passes out   Family History  Problem Relation Age of Onset   Lupus Mother    Cancer Father 8       thymic cancer   COPD Father    Heart disease Father        CHF   Asthma Father    Cancer Sister    Breast cancer Sister 46       "best kind", lumpectomy and radiation   Dermatomyositis Daughter 46       juvenile form   Irritable bowel syndrome Daughter    Colon cancer Maternal Grandmother        80's   Cancer Maternal Grandmother    Hypertension Paternal Grandmother    Hyperlipidemia Paternal Grandmother    Thyroid disease Paternal Grandmother        had goiter removed   Stroke Paternal Grandmother 31   Hypertension Paternal Grandfather    Hyperlipidemia Paternal Grandfather    Heart disease Paternal Grandfather        MI first in his 3's   Stroke Paternal Grandfather    Cancer Paternal Uncle        started on tonsils (smoker)   Breast cancer Paternal Aunt 14   Breast cancer Cousin 52   Breast cancer  Cousin 41       (the mother of the 84 y/o cousin with br ca   Thyroid cancer Cousin    Diabetes Neg Hx    Ovarian cancer Neg Hx     Current Outpatient Medications (Endocrine & Metabolic):    levothyroxine (SYNTHROID, LEVOTHROID) 75 MCG tablet, Take 75 mcg by mouth daily before breakfast.   predniSONE (DELTASONE) 20 MG tablet, Take 2 tablets (40 mg total) by  mouth daily with breakfast.  Current Outpatient Medications (Cardiovascular):    rosuvastatin (CRESTOR) 10 MG tablet, TAKE 1 TABLET BY MOUTH EVERY DAY  Current Outpatient Medications (Respiratory):    cetirizine (ZYRTEC) 10 MG tablet, Take 10 mg by mouth daily.   fluticasone (FLONASE) 50 MCG/ACT nasal spray, Place 2 sprays into both nostrils daily.   montelukast (SINGULAIR) 10 MG tablet, TAKE 1 TABLET BY MOUTH EVERYDAY AT BEDTIME    Current Outpatient Medications (Other):    ALPRAZolam (XANAX) 0.5 MG tablet, Take 0.5-1 tablets (0.25-0.5 mg total) by mouth 3 (three) times daily as needed for anxiety or sleep.   b complex vitamins capsule, Take 1 capsule by mouth daily.   busPIRone (BUSPAR) 15 MG tablet, Take 1 tablet (15 mg total) by mouth 2 (two) times daily.   Calcium 250 MG CAPS, Take 1-2 capsules by mouth daily.   cholecalciferol (VITAMIN D3) 25 MCG (1000 UNIT) tablet, Take 1,000 Units by mouth daily.   hydrocortisone 2.5 % ointment, Apply topically 2 (two) times daily.   Magnesium 500 MG TABS, Take 1 tablet by mouth daily.   MILK THISTLE PO, Take 1 capsule by mouth daily.   Misc Natural Products (TART CHERRY ADVANCED PO), Take 1 capsule by mouth daily.   mupirocin ointment (BACTROBAN) 2 %, Apply 1 application topically 2 (two) times daily.   NON FORMULARY, Take 2 capsules by mouth daily.   NON FORMULARY, Take 2 capsules by mouth daily.   Omega-3 Fatty Acids (FISH OIL) 1000 MG CPDR, Take 2,000 mg by mouth daily.   tamoxifen (NOLVADEX) 10 MG tablet, TAKE 1/2 TABLET BY MOUTH DAILY   triamcinolone cream (KENALOG) 0.5 %, Apply  topically 3 (three) times daily.   valACYclovir (VALTREX) 1000 MG tablet, TAKE 2 TABLETS AT ONSET OF COLD SORE. REPEAT ONCE IN 12 HOURS (4 PILLS/COURSE)   venlafaxine XR (EFFEXOR XR) 150 MG 24 hr capsule, Take 1 capsule (150 mg total) by mouth daily with breakfast.   vitamin C (ASCORBIC ACID) 500 MG tablet, Take 500 mg by mouth daily.   Reviewed prior external information including notes and imaging from  primary care provider As well as notes that were available from care everywhere and other healthcare systems.  Past medical history, social, surgical and family history all reviewed in electronic medical record.  No pertanent information unless stated regarding to the chief complaint.   Review of Systems:  No headache, visual changes, nausea, vomiting, diarrhea, constipation, dizziness, abdominal pain, skin rash, fevers, chills, night sweats, weight loss, swollen lymph nodes, body aches, joint swelling, chest pain, shortness of breath, mood changes. POSITIVE muscle aches  Objective  Blood pressure 122/84, pulse 95, height 5\' 6"  (1.676 m), weight 167 lb (75.8 kg), last menstrual period 11/19/2007, SpO2 97%.   General: No apparent distress alert and oriented x3 mood and affect normal, dressed appropriately.  HEENT: Pupils equal, extraocular movements intact  Respiratory: Patient's speak in full sentences and does not appear short of breath  Cardiovascular: No lower extremity edema, non tender, no erythema  Knee exam shows right oa no instability noted but TTP on the medial side   Limited muscular skeletal ultrasound was performed and interpreted by Antoine Primas, M  Limited ultrasound the patient is right knee shows arthritic changes mostly of the medial compartment with a degenerative displaced meniscal tear. Impression: Medial arthritis of the knee  After informed written and verbal consent, patient was seated on exam table. Right knee was prepped with alcohol swab and utilizing  anterolateral approach, patient's right knee space was injected with 4:1  marcaine 0.5%: Kenalog 40mg /dL. Patient tolerated the procedure well without immediate complications.  Deferred shoulder exam     Impression and Recommendations:    The above documentation has been reviewed and is accurate and complete Judi Saa, DO

## 2023-10-13 ENCOUNTER — Encounter: Payer: Self-pay | Admitting: Family Medicine

## 2023-10-13 ENCOUNTER — Other Ambulatory Visit: Payer: Self-pay

## 2023-10-13 ENCOUNTER — Ambulatory Visit (INDEPENDENT_AMBULATORY_CARE_PROVIDER_SITE_OTHER): Payer: Medicare Other | Admitting: Family Medicine

## 2023-10-13 VITALS — BP 122/84 | HR 95 | Ht 66.0 in | Wt 167.0 lb

## 2023-10-13 DIAGNOSIS — M19011 Primary osteoarthritis, right shoulder: Secondary | ICD-10-CM | POA: Insufficient documentation

## 2023-10-13 DIAGNOSIS — M1711 Unilateral primary osteoarthritis, right knee: Secondary | ICD-10-CM | POA: Diagnosis not present

## 2023-10-13 DIAGNOSIS — M170 Bilateral primary osteoarthritis of knee: Secondary | ICD-10-CM

## 2023-10-13 DIAGNOSIS — M25511 Pain in right shoulder: Secondary | ICD-10-CM | POA: Diagnosis not present

## 2023-10-13 NOTE — Assessment & Plan Note (Signed)
Significant arthritis with edema noted on the MRI.  I do feel like this is what is contributing.  I do feel that at this point with the severity of it is an acromioplasty would be the most beneficial especially with the inferior spurring also noted.  Patient will be referred to orthopedic surgery to discuss further.

## 2023-10-13 NOTE — Assessment & Plan Note (Addendum)
Hopeful that this will make some improvement.  Chronic problem with exacerbation.  Continue to monitor otherwise.  Follow-up again in 10weeks

## 2023-10-13 NOTE — Patient Instructions (Addendum)
Injected knee today Dr. Veda Canning office will call you

## 2023-10-16 ENCOUNTER — Other Ambulatory Visit (HOSPITAL_COMMUNITY): Payer: Self-pay | Admitting: Internal Medicine

## 2023-10-16 DIAGNOSIS — R7401 Elevation of levels of liver transaminase levels: Secondary | ICD-10-CM

## 2023-10-20 ENCOUNTER — Encounter: Payer: Self-pay | Admitting: Hematology and Oncology

## 2023-10-24 ENCOUNTER — Ambulatory Visit (HOSPITAL_COMMUNITY)
Admission: RE | Admit: 2023-10-24 | Discharge: 2023-10-24 | Disposition: A | Discharge status: Home / Self Care | Payer: Medicare Other | Source: Ambulatory Visit | Attending: Internal Medicine | Admitting: Internal Medicine

## 2023-10-24 DIAGNOSIS — R7401 Elevation of levels of liver transaminase levels: Secondary | ICD-10-CM | POA: Insufficient documentation

## 2023-10-26 ENCOUNTER — Encounter: Payer: Self-pay | Admitting: Family Medicine

## 2023-11-05 ENCOUNTER — Encounter: Payer: Self-pay | Admitting: Family Medicine

## 2023-11-11 HISTORY — PX: SHOULDER ARTHROSCOPY: SHX128

## 2023-12-19 NOTE — Progress Notes (Unsigned)
Rachel Juarez [address] [place] [address] Phone: [phone] Subjective:   INadine Counts, am serving as a scribe for Dr. Antoine Primas.  I'm seeing this patient by the request  of:  Brayton Mars, MD  CC:   [id number]  10/13/2023 Hopeful that this will make some improvement.  Chronic problem with exacerbation.  Continue to monitor otherwise.  Follow-up again in 10weeks   Significant arthritis with edema noted on the MRI.  I do feel like this is what is contributing.  I do feel that at this point with the severity of it is an acromioplasty would be the most beneficial especially with the inferior spurring also noted.  Patient will be referred to orthopedic surgery to discuss further.     Updated 12/21/2022 Rachel Juarez is a 68 y.o. female coming in with complaint of B knee and R shoulder pain. Wants injection in knee. Bilateral knee  Does have some instability but nothing severe.  Patient is being active to the point where she has been able to workout and fairly regularly.      Past Medical History:  Diagnosis Date   Allergy    Anxiety    Depression, major, in remission (HCC) 2006   when daughter dx'd with autoimmune disorder   Family history of breast cancer    Family history of cancer tonsil    Family history of thyroid cancer    Generalized anxiety disorder    generalized, and related to mammograms and flying   Glaucoma suspect    sees Dr. Dione Booze, has all been okay, sees him qoyr   Herpes labialis    Hypercholesterolemia    high HDL, borderline LDL   Hypothyroidism    treated by Dr. Talmage Nap   Multinodular goiter    followed by Dr. 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 Ranae Pila   KNEE ARTHROSCOPY Right  02/21/2022   for torn medial meniscus (Dr. Jerl Santos)   Social History   Socioeconomic History   Marital status: Married    Spouse name: Not on file   Number of children: Not on file   Years of education: Not on file   Highest education level: Not on file  Occupational History   Occupation: yoga teacher  Tobacco Use   Smoking status: Never   Smokeless tobacco: Never  Vaping Use   Vaping status: Never Used  Substance and Sexual Activity   Alcohol use: Yes    Comment: 1-2 Vodka sodas/day   Drug use: No   Sexual activity: Yes    Partners: Male    Birth control/protection: Post-menopausal  Other Topics Concern   Not on file  Social History Narrative   Pre-COVID, taught yoga at multiple places.    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 1 dog (Malti-poo)   Lives with her husband, and she has a daughter, [name]--graduated from [employer], married, lives in [place], works as an Scientist, forensic at [employer].   Volunteers at the [employer]      Updated 02/2023   Social Drivers of Health   Financial Resource Strain: Low Risk  (10/11/2023)   Received from [facility]   Overall Financial  Resource Strain (CARDIA)    Difficulty of Paying Living Expenses: Not hard at all  Food Insecurity: No Food Insecurity (10/11/2023)   Received from [facility]   Hunger Vital Sign    Worried About Running Out of Food in the Last Year: Never true    Ran Out of Food in the Last Year: Never true  Transportation Needs: No Transportation Needs (10/11/2023)   Received from [facility] - Transportation    In the past 12 months, has lack of transportation kept you from medical appointments or from getting medications?: No    Lack of Transportation (Non-Medical): No  Physical Activity: Sufficiently Active (09/22/2023)   Exercise Vital Sign    Days of Exercise per Week: 4 days    Minutes of Exercise per Session: 40 min  Stress: Stress  Concern Present (09/22/2023)   [employer] of Occupational Health - Occupational Stress Questionnaire    Feeling of Stress : To some extent  Social Connections: Moderately Integrated (09/22/2023)   Social Connection and Isolation Panel [NHANES]    Frequency of Communication with Friends and Family: Once a week    Frequency of Social Gatherings with Friends and Family: Once a week    Attends Religious Services: 1 to 4 times per year    Active Member of [employer] or Organizations: Not on file    Attends Engineer, structural: More than 4 times per year    Marital Status: Married   Allergies  Allergen Reactions   Demerol [Meperidine] Other (See Comments)    Low bp, passes out   Family History  Problem Relation Age of Onset   Lupus Mother    Cancer Father 59       thymic cancer   COPD Father    Heart disease Father        CHF   Asthma Father    Cancer Sister    Breast cancer Sister 38       "best kind", lumpectomy and radiation   Dermatomyositis Daughter 86       juvenile form   Irritable bowel syndrome Daughter    Colon cancer Maternal Grandmother        80's   Cancer Maternal Grandmother    Hypertension Paternal Grandmother    Hyperlipidemia Paternal Grandmother    Thyroid disease Paternal Grandmother        had goiter removed   Stroke Paternal Grandmother 58   Hypertension Paternal Grandfather    Hyperlipidemia Paternal Grandfather    Heart disease Paternal Grandfather        MI first in his 13's   Stroke Paternal Grandfather    Cancer Paternal Uncle        started on tonsils (smoker)   Breast cancer Paternal Aunt 77   Breast cancer Cousin 11   Breast cancer Cousin 82       (the mother of the 76 y/o cousin with br ca   Thyroid cancer Cousin    Diabetes Neg Hx    Ovarian cancer Neg Hx     Current Outpatient Medications (Endocrine & Metabolic):    levothyroxine (SYNTHROID, LEVOTHROID) 75 MCG tablet, Take 75 mcg by mouth daily before breakfast.    predniSONE (DELTASONE) 20 MG tablet, Take 2 tablets (40 mg total) by mouth daily with breakfast.  Current Outpatient Medications (Cardiovascular):    rosuvastatin (CRESTOR) 10 MG tablet, TAKE 1 TABLET BY MOUTH EVERY DAY  Current Outpatient Medications (Respiratory):  cetirizine (ZYRTEC) 10 MG tablet, Take 10 mg by mouth daily.   fluticasone (FLONASE) 50 MCG/ACT nasal spray, Place 2 sprays into both nostrils daily.   montelukast (SINGULAIR) 10 MG tablet, TAKE 1 TABLET BY MOUTH EVERYDAY AT BEDTIME    Current Outpatient Medications (Other):    ALPRAZolam (XANAX) 0.5 MG tablet, Take 0.5-1 tablets (0.25-0.5 mg total) by mouth 3 (three) times daily as needed for anxiety or sleep.   b complex vitamins capsule, Take 1 capsule by mouth daily.   busPIRone (BUSPAR) 15 MG tablet, Take 1 tablet (15 mg total) by mouth 2 (two) times daily.   Calcium 250 MG CAPS, Take 1-2 capsules by mouth daily.   cholecalciferol (VITAMIN D3) 25 MCG (1000 UNIT) tablet, Take 1,000 Units by mouth daily.   hydrocortisone 2.5 % ointment, Apply topically 2 (two) times daily.   Magnesium 500 MG TABS, Take 1 tablet by mouth daily.   MILK THISTLE PO, Take 1 capsule by mouth daily.   Misc Natural Products (TART CHERRY ADVANCED PO), Take 1 capsule by mouth daily.   mupirocin ointment (BACTROBAN) 2 %, Apply 1 application topically 2 (two) times daily.   NON FORMULARY, Take 2 capsules by mouth daily.   NON FORMULARY, Take 2 capsules by mouth daily.   Omega-3 Fatty Acids (FISH OIL) 1000 MG CPDR, Take 2,000 mg by mouth daily.   tamoxifen (NOLVADEX) 10 MG tablet, TAKE 1/2 TABLET BY MOUTH DAILY   triamcinolone cream (KENALOG) 0.5 %, Apply topically 3 (three) times daily.   valACYclovir (VALTREX) 1000 MG tablet, TAKE 2 TABLETS AT ONSET OF COLD SORE. REPEAT ONCE IN 12 HOURS (4 PILLS/COURSE)   venlafaxine XR (EFFEXOR XR) 150 MG 24 hr capsule, Take 1 capsule (150 mg total) by mouth daily with breakfast.   vitamin C (ASCORBIC ACID) 500  MG tablet, Take 500 mg by mouth daily.   Reviewed prior external information including notes and imaging from  primary care provider As well as notes that were available from care everywhere and other healthcare systems.  Past medical history, social, surgical and family history all reviewed in electronic medical record.  No pertanent information unless stated regarding to the chief complaint.   Review of Systems:  No headache, visual changes, nausea, vomiting, diarrhea, constipation, dizziness, abdominal pain, skin rash, fevers, chills, night sweats, weight loss, swollen lymph nodes, body aches, joint swelling, chest pain, shortness of breath, mood changes. POSITIVE muscle aches  Objective  Blood pressure 122/78, pulse 98, height 5\' 6"  (1.676 m), weight 162 lb (73.5 kg), last menstrual period 11/19/2007, SpO2 98%.   General: No apparent distress alert and oriented x3 mood and affect normal, dressed appropriately.  HEENT: Pupils equal, extraocular movements intact  Respiratory: Patient's speak in full sentences and does not appear short of breath  Cardiovascular: No lower extremity edema, non tender, no erythema   Bilateral knee arthritis noted, some instability noted.  Crepitus noted.  After informed written and verbal consent, patient was seated on exam table. Right knee was prepped with alcohol swab and utilizing anterolateral approach, patient's right knee space was injected with 4:1  marcaine 0.5%: Kenalog 40mg /dL. Patient tolerated the procedure well without immediate complications.  After informed written and verbal consent, patient was seated on exam table. Left knee was prepped with alcohol swab and utilizing anterolateral approach, patient's left knee space was injected with 4:1  marcaine 0.5%: Kenalog 40mg /dL. Patient tolerated the procedure well without immediate complications.   Impression and Recommendations:     The  above documentation has been reviewed and is accurate and  complete Judi Saa, DO

## 2023-12-22 ENCOUNTER — Ambulatory Visit (INDEPENDENT_AMBULATORY_CARE_PROVIDER_SITE_OTHER): Payer: Medicare Other | Admitting: Family Medicine

## 2023-12-22 ENCOUNTER — Encounter: Payer: Self-pay | Admitting: Family Medicine

## 2023-12-22 VITALS — BP 122/78 | HR 98 | Ht 66.0 in | Wt 162.0 lb

## 2023-12-22 DIAGNOSIS — G8929 Other chronic pain: Secondary | ICD-10-CM | POA: Diagnosis not present

## 2023-12-22 DIAGNOSIS — M25561 Pain in right knee: Secondary | ICD-10-CM | POA: Diagnosis not present

## 2023-12-22 DIAGNOSIS — M170 Bilateral primary osteoarthritis of knee: Secondary | ICD-10-CM | POA: Diagnosis not present

## 2023-12-22 DIAGNOSIS — M25562 Pain in left knee: Secondary | ICD-10-CM | POA: Diagnosis not present

## 2023-12-22 NOTE — Assessment & Plan Note (Signed)
Chronic problem with mild exacerbation.  Is trying to stay active.  Trying to avoid any type of surgical intervention.  Bilateral injections given today and hopeful that I will make a significant improvement.  Follow-up with me again in 10 to 12 weeks.

## 2023-12-22 NOTE — Patient Instructions (Signed)
Injection in knees today See you again in in April, if feeling good push back until June or July Keep hands in peripheral vision

## 2023-12-24 ENCOUNTER — Inpatient Hospital Stay: Payer: Medicare Other | Attending: Hematology and Oncology | Admitting: Hematology and Oncology

## 2023-12-24 VITALS — BP 121/69 | HR 79 | Temp 97.8°F | Resp 18 | Ht 66.0 in | Wt 160.7 lb

## 2023-12-24 DIAGNOSIS — R252 Cramp and spasm: Secondary | ICD-10-CM | POA: Diagnosis not present

## 2023-12-24 DIAGNOSIS — Z9189 Other specified personal risk factors, not elsewhere classified: Secondary | ICD-10-CM | POA: Insufficient documentation

## 2023-12-24 DIAGNOSIS — Z808 Family history of malignant neoplasm of other organs or systems: Secondary | ICD-10-CM | POA: Diagnosis not present

## 2023-12-24 DIAGNOSIS — N951 Menopausal and female climacteric states: Secondary | ICD-10-CM | POA: Insufficient documentation

## 2023-12-24 DIAGNOSIS — Z79810 Long term (current) use of selective estrogen receptor modulators (SERMs): Secondary | ICD-10-CM | POA: Insufficient documentation

## 2023-12-24 NOTE — Progress Notes (Signed)
It was made 4/24  I cancelled

## 2023-12-24 NOTE — Assessment & Plan Note (Signed)
Genetic testing: Negative Age of menarche: 67 First live birth at age 68 Oral contraceptive pill use for about 30 years.  No use of HRT.  She is postmenopausal Family history: Breast cancer in maternal aunt was diagnosed in her mid 37s, maternal cousin diagnosed at age of 58 and a cousin's daughter who was diagnosed with breast cancer at the age of 57.  Maternal grandmother had colon cancer in her 9s.  Father had thymic cancer.  Another paternal aunt had breast cancer at 26 and is currently living.   Tyrer-Cuzick risk: Lifetime risk: 24% Gail risk model: 5-year risk: 4.7%   Breast cancer surveillance: 1.  Breast MRI 09/08/2023: Benign, breast density category B.    2. mammogram 02/25/23 [facility]: Benign breast density category B 3.  Breast exam 12/24/2023: Benign   Current treatment for risk reduction: Tamoxifen 5 mg daily starting 12/21/2021 Tamoxifen toxicities: No side effects Mild Hot flashes (same as before) Mild muscle cramps   Denies any vaginal bleeding or spotting.   Return to clinic in 1 year for follow-up

## 2023-12-24 NOTE — Progress Notes (Signed)
Patient Care Team: Brayton Mars, MD as PCP - General (Internal Medicine)  DIAGNOSIS:  Encounter Diagnosis  Name Primary?   At high risk for breast cancer Yes      CHIEF COMPLIANT: Follow-up on tamoxifen therapy  HISTORY OF PRESENT ILLNESS:   History of Present Illness   The patient is a 68 year old female who presents for follow-up regarding breast cancer surveillance.  She has been on tamoxifen therapy for two years, taking half a tablet without any issues. Her recent prescription refill lasts a long time due to her dosing regimen. Her imaging studies, including mammograms and MRIs, have shown improvement in breast density, now classified as B density from a previous D density. She is satisfied with these results, which provide her peace of mind.  She volunteers at a garden, which she finds to be a great place to spend time.         ALLERGIES:  is allergic to demerol [meperidine].  MEDICATIONS:  Current Outpatient Medications  Medication Sig Dispense Refill   ALPRAZolam (XANAX) 0.5 MG tablet Take 0.5-1 tablets (0.25-0.5 mg total) by mouth 3 (three) times daily as needed for anxiety or sleep. 15 tablet 0   b complex vitamins capsule Take 1 capsule by mouth daily.     busPIRone (BUSPAR) 15 MG tablet Take 1 tablet (15 mg total) by mouth 2 (two) times daily. 180 tablet 3   Calcium 250 MG CAPS Take 1-2 capsules by mouth daily.     cetirizine (ZYRTEC) 10 MG tablet Take 10 mg by mouth daily.     cholecalciferol (VITAMIN D3) 25 MCG (1000 UNIT) tablet Take 1,000 Units by mouth daily.     fluticasone (FLONASE) 50 MCG/ACT nasal spray Place 2 sprays into both nostrils daily.     hydrocortisone 2.5 % ointment Apply topically 2 (two) times daily. 30 g 0   levothyroxine (SYNTHROID, LEVOTHROID) 75 MCG tablet Take 75 mcg by mouth daily before breakfast.     Magnesium 500 MG TABS Take 1 tablet by mouth daily.     MILK THISTLE PO Take 1 capsule by mouth daily.     Misc Natural Products  (TART CHERRY ADVANCED PO) Take 1 capsule by mouth daily.     montelukast (SINGULAIR) 10 MG tablet TAKE 1 TABLET BY MOUTH EVERYDAY AT BEDTIME 90 tablet 3   mupirocin ointment (BACTROBAN) 2 % Apply 1 application topically 2 (two) times daily. 22 g 0   NON FORMULARY Take 2 capsules by mouth daily.     NON FORMULARY Take 2 capsules by mouth daily.     Omega-3 Fatty Acids (FISH OIL) 1000 MG CPDR Take 2,000 mg by mouth daily.     predniSONE (DELTASONE) 20 MG tablet Take 2 tablets (40 mg total) by mouth daily with breakfast. 14 tablet 0   rosuvastatin (CRESTOR) 10 MG tablet TAKE 1 TABLET BY MOUTH EVERY DAY 90 tablet 0   tamoxifen (NOLVADEX) 10 MG tablet TAKE 1/2 TABLET BY MOUTH DAILY 45 tablet 5   triamcinolone cream (KENALOG) 0.5 % Apply topically 3 (three) times daily.     valACYclovir (VALTREX) 1000 MG tablet TAKE 2 TABLETS AT ONSET OF COLD SORE. REPEAT ONCE IN 12 HOURS (4 PILLS/COURSE) 28 tablet 0   venlafaxine XR (EFFEXOR XR) 150 MG 24 hr capsule Take 1 capsule (150 mg total) by mouth daily with breakfast. 90 capsule 1   vitamin C (ASCORBIC ACID) 500 MG tablet Take 500 mg by mouth daily.  No current facility-administered medications for this visit.    PHYSICAL EXAMINATION: ECOG PERFORMANCE STATUS: 1 - Symptomatic but completely ambulatory  Vitals:   12/24/23 0938  BP: 121/69  Pulse: 79  Resp: 18  Temp: 97.8 F (36.6 C)  SpO2: 98%   Filed Weights   12/24/23 0938  Weight: 160 lb 11.2 oz (72.9 kg)    Physical Exam   BREAST: Normal examination.      (exam performed in the presence of a chaperone)  LABORATORY DATA:  I have reviewed the data as listed    Latest Ref Rng & Units 09/22/2023   12:17 PM 03/19/2023   12:24 PM 02/14/2022   11:27 AM  CMP  Glucose 70 - 99 mg/dL  94  97   BUN 8 - 27 mg/dL  10  16   Creatinine 3.08 - 1.00 mg/dL  6.57  8.46   Sodium 962 - 144 mmol/L  140  141   Potassium 3.5 - 5.2 mmol/L  5.2  5.3   Chloride 96 - 106 mmol/L  103  104   CO2 20 - 29  mmol/L  23  25   Calcium 8.7 - 10.3 mg/dL  95.2  9.9   Total Protein 6.0 - 8.5 g/dL 6.9  7.0  6.7   Total Bilirubin 0.0 - 1.2 mg/dL 0.4  0.4  0.4   Alkaline Phos 44 - 121 IU/L 67  60  63   AST 0 - 40 IU/L 26  31  20    ALT 0 - 32 IU/L 41  48  27     Lab Results  Component Value Date   WBC 6.0 03/19/2023   HGB 14.8 03/19/2023   HCT 45.1 03/19/2023   MCV 101 (H) 03/19/2023   PLT 268 03/19/2023   NEUTROABS 3.3 03/19/2023    ASSESSMENT & PLAN:  At high risk for breast cancer Genetic testing: Negative Age of menarche: 82 First live birth at age 33 Oral contraceptive pill use for about 30 years.  No use of HRT.  She is postmenopausal Family history: Breast cancer in maternal aunt was diagnosed in her mid 44s, maternal cousin diagnosed at age of 81 and a cousin's daughter who was diagnosed with breast cancer at the age of 28.  Maternal grandmother had colon cancer in her 50s.  Father had thymic cancer.  Another paternal aunt had breast cancer at 70 and is currently living.   Tyrer-Cuzick risk: Lifetime risk: 24% Gail risk model: 5-year risk: 4.7%   Breast cancer surveillance: 1.  Breast MRI 09/08/2023: Benign, breast density category B.    2. mammogram 02/25/23 [facility]: Benign breast density category B 3.  Breast exam 12/24/2023: Benign   We decided to do breast MRIs every other year.  Current treatment for risk reduction: Tamoxifen 5 mg daily starting 12/21/2021 Tamoxifen toxicities: No side effects Mild Hot flashes (same as before) Mild muscle cramps   Denies any vaginal bleeding or spotting. [facility]  Return to clinic in 1 year for follow-up   No orders of the defined types were placed in this encounter.  The patient has a good understanding of the overall plan. she agrees with it. she will call with any problems that may develop before the next visit here. Total time spent: 30 mins including face to face time and time spent for  planning, charting and co-ordination of care   Tamsen Meek, MD 12/24/23

## 2024-01-12 ENCOUNTER — Other Ambulatory Visit: Payer: Self-pay | Admitting: Medical Genetics

## 2024-01-20 ENCOUNTER — Other Ambulatory Visit (HOSPITAL_COMMUNITY)
Admission: RE | Admit: 2024-01-20 | Discharge: 2024-01-20 | Disposition: A | Discharge status: Home / Self Care | Payer: Self-pay | Source: Ambulatory Visit | Attending: Medical Genetics | Admitting: Medical Genetics

## 2024-02-01 LAB — GENECONNECT MOLECULAR SCREEN: Genetic Analysis Overall Interpretation: NEGATIVE

## 2024-02-10 ENCOUNTER — Encounter: Payer: Self-pay | Admitting: Family Medicine

## 2024-02-10 NOTE — Telephone Encounter (Signed)
 Patient scheduled AWV for 03/22/24.

## 2024-02-22 ENCOUNTER — Other Ambulatory Visit: Payer: Self-pay | Admitting: Family Medicine

## 2024-02-22 DIAGNOSIS — F411 Generalized anxiety disorder: Secondary | ICD-10-CM

## 2024-02-23 NOTE — Progress Notes (Signed)
 [name] [address] [place] [address] Phone: [phone] Subjective:   Rachel Juarez, am serving as a scribe for Dr. Antoine Primas.  I'm seeing this patient by the request  of:  Joselyn Arrow, MD  CC: Right shoulder pain  [id number]  12/22/2023 Chronic problem with mild exacerbation.  Is trying to stay active.  Trying to avoid any type of surgical intervention.  Bilateral injections given today and hopeful that I will make a significant improvement.  Follow-up with me again in 10 to 12 weeks.     Updated 03/01/2024 Rachel Juarez is a 68 y.o. female coming in with complaint of B knee pain. Patient states that her knee is doing well. Pain throughout back of shoulder and tightness in anterior. Surgery was December 17th. Released to go back to activity on March 18th.   States that her R shoulder has been bothering her a lot.       Past Medical History:  Diagnosis Date   Allergy    Anxiety    Depression, major, in remission (HCC) 2006   when daughter dx'd with autoimmune disorder   Family history of breast cancer    Family history of cancer tonsil    Family history of thyroid cancer    Generalized anxiety disorder    generalized, and related to mammograms and flying   Glaucoma suspect    sees Dr. Dione Booze, has all been okay, sees him qoyr   Herpes labialis    Hypercholesterolemia    high HDL, borderline LDL   Hypothyroidism    treated by Dr. Talmage Nap   Multinodular goiter    followed by Dr. 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 Ranae Pila   KNEE ARTHROSCOPY Right 02/21/2022   for torn medial meniscus (Dr. Jerl Santos)   Social History   Socioeconomic History   Marital status: Married    Spouse name: Not on file    Number of children: Not on file   Years of education: Not on file   Highest education level: Not on file  Occupational History   Occupation: yoga teacher  Tobacco Use   Smoking status: Never   Smokeless tobacco: Never  Vaping Use   Vaping status: Never Used  Substance and Sexual Activity   Alcohol use: Yes    Comment: 1-2 Vodka sodas/day   Drug use: No   Sexual activity: Yes    Partners: Male    Birth control/protection: Post-menopausal  Other Topics Concern   Not on file  Social History Narrative   Pre-COVID, taught yoga at multiple places.    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 1 dog (Malti-poo)   Lives with her husband, and she has a daughter, [name]--graduated from [employer], married, lives in [place], works as an Scientist, forensic at [employer].   Volunteers at the [employer]      Updated 02/2023   Social Drivers of Health   Financial Resource Strain: Low Risk  (10/11/2023)   Received from [facility]   Overall Financial Resource Strain (CARDIA)    Difficulty of Paying Living Expenses: Not hard at all  Food Insecurity: No Food Insecurity (10/11/2023)   [facility] from [facility]   Hunger Vital Sign    Worried About Running Out of Food in the Last Year: Never true    Ran Out of Food in the Last Year: Never true  Transportation Needs: No Transportation Needs (10/11/2023)   Received from [facility] - Transportation    In the past 12 months, has lack of transportation kept you from medical appointments or from getting medications?: No    Lack of Transportation (Non-Medical): No  Physical Activity: Sufficiently Active (09/22/2023)   Exercise Vital Sign    Days of Exercise per Week: 4 days    Minutes of Exercise per Session: 40 min  Stress: Stress Concern Present (09/22/2023)   [employer] of Occupational Health - Occupational Stress Questionnaire    Feeling of Stress : To some extent  Social  Connections: Moderately Integrated (09/22/2023)   Social Connection and Isolation Panel [NHANES]    Frequency of Communication with Friends and Family: Once a week    Frequency of Social Gatherings with Friends and Family: Once a week    Attends Religious Services: 1 to 4 times per year    Active Member of [employer] or Organizations: Not on file    Attends Engineer, structural: More than 4 times per year    Marital Status: Married   Allergies  Allergen Reactions   Demerol [Meperidine] Other (See Comments)    Low bp, passes out   Family History  Problem Relation Age of Onset   Lupus Mother    Cancer Father 78       thymic cancer   COPD Father    Heart disease Father        CHF   Asthma Father    Cancer Sister    Breast cancer Sister 37       "best kind", lumpectomy and radiation   Dermatomyositis Daughter 64       juvenile form   Irritable bowel syndrome Daughter    Colon cancer Maternal Grandmother        80's   Cancer Maternal Grandmother    Hypertension Paternal Grandmother    Hyperlipidemia Paternal Grandmother    Thyroid disease Paternal Grandmother        had goiter removed   Stroke Paternal Grandmother 38   Hypertension Paternal Grandfather    Hyperlipidemia Paternal Grandfather    Heart disease Paternal Grandfather        MI first in his 32's   Stroke Paternal Grandfather    Cancer Paternal Uncle        started on tonsils (smoker)   Breast cancer Paternal Aunt 43   Breast cancer Cousin 10   Breast cancer Cousin 54       (the mother of the 30 y/o cousin with br ca   Thyroid cancer Cousin    Diabetes Neg Hx    Ovarian cancer Neg Hx     Current Outpatient Medications (Endocrine & Metabolic):    levothyroxine (SYNTHROID, LEVOTHROID) 75 MCG tablet, Take 75 mcg by mouth daily before breakfast.   predniSONE (DELTASONE) 20 MG tablet, Take 2 tablets (40 mg total) by mouth daily with breakfast.  Current Outpatient Medications (Cardiovascular):     rosuvastatin (CRESTOR) 10 MG tablet, TAKE 1 TABLET BY MOUTH EVERY DAY  Current Outpatient Medications (Respiratory):    cetirizine (ZYRTEC) 10 MG tablet, Take 10 mg by mouth daily.   fluticasone (FLONASE) 50 MCG/ACT nasal spray, Place 2 sprays into both  nostrils daily.   montelukast (SINGULAIR) 10 MG tablet, TAKE 1 TABLET BY MOUTH EVERYDAY AT BEDTIME    Current Outpatient Medications (Other):    ALPRAZolam (XANAX) 0.5 MG tablet, Take 0.5-1 tablets (0.25-0.5 mg total) by mouth 3 (three) times daily as needed for anxiety or sleep.   b complex vitamins capsule, Take 1 capsule by mouth daily.   busPIRone (BUSPAR) 15 MG tablet, TAKE 1 TABLET BY MOUTH 2 TIMES A DAY   Calcium 250 MG CAPS, Take 1-2 capsules by mouth daily.   cholecalciferol (VITAMIN D3) 25 MCG (1000 UNIT) tablet, Take 1,000 Units by mouth daily.   hydrocortisone 2.5 % ointment, Apply topically 2 (two) times daily.   Magnesium 500 MG TABS, Take 1 tablet by mouth daily.   MILK THISTLE PO, Take 1 capsule by mouth daily.   Misc Natural Products (TART CHERRY ADVANCED PO), Take 1 capsule by mouth daily.   mupirocin ointment (BACTROBAN) 2 %, Apply 1 application topically 2 (two) times daily.   NON FORMULARY, Take 2 capsules by mouth daily.   NON FORMULARY, Take 2 capsules by mouth daily.   Omega-3 Fatty Acids (FISH OIL) 1000 MG CPDR, Take 2,000 mg by mouth daily.   tamoxifen (NOLVADEX) 10 MG tablet, TAKE 1/2 TABLET BY MOUTH DAILY   triamcinolone cream (KENALOG) 0.5 %, Apply topically 3 (three) times daily.   valACYclovir (VALTREX) 1000 MG tablet, TAKE 2 TABLETS AT ONSET OF COLD SORE. REPEAT ONCE IN 12 HOURS (4 PILLS/COURSE)   venlafaxine XR (EFFEXOR XR) 150 MG 24 hr capsule, Take 1 capsule (150 mg total) by mouth daily with breakfast.   vitamin C (ASCORBIC ACID) 500 MG tablet, Take 500 mg by mouth daily.   Reviewed prior external information including notes and imaging from  primary care provider As well as notes that were available  from care everywhere and other healthcare systems.  Past medical history, social, surgical and family history all reviewed in electronic medical record.  No pertanent information unless stated regarding to the chief complaint.   Review of Systems:  No headache, visual changes, nausea, vomiting, diarrhea, constipation, dizziness, abdominal pain, skin rash, fevers, chills, night sweats, weight loss, swollen lymph nodes, body aches, joint swelling, chest pain, shortness of breath, mood changes. POSITIVE muscle aches  Objective  Blood pressure 118/74, pulse 92, height 5\' 6"  (1.676 m), weight 158 lb (71.7 kg), last menstrual period 11/19/2007, SpO2 92%.   General: No apparent distress alert and oriented x3 mood and affect normal, dressed appropriately.  HEENT: Pupils equal, extraocular movements intact  Respiratory: Patient's speak in full sentences and does not appear short of breath  Cardiovascular: No lower extremity edema, non tender, no erythema  Right shoulder exam shows a patient does have some very mild weakness noted of the rotator cuff.  Positive impingement noted.  Tightness with O'Brien's noted. Bilateral knees do have crepitus noted as well.  Trace effusion noted right greater than left   Limited muscular skeletal ultrasound was performed and interpreted by Antoine Primas, M  Limited ultrasound does show hypoechoic changes noted.  Some of it over the supraspinatus that could be a very small intersubstance tear noted.  Questionable postsurgical changes noted acromioclavicular joint and the type III acromion seems different than previously.   Procedure: Real-time Ultrasound Guided Injection of right glenohumeral joint Device: GE Logiq Q7  Ultrasound guided injection is preferred based studies that show increased duration, increased effect, greater accuracy, decreased procedural pain, increased response rate with ultrasound guided versus blind  injection.  Verbal informed consent  obtained.  Time-out conducted.  Noted no overlying erythema, induration, or other signs of local infection.  Skin prepped in a sterile fashion.  Local anesthesia: Topical Ethyl chloride.  With sterile technique and under real time ultrasound guidance:  Joint visualized.  23g 1  inch needle inserted posterior approach. Pictures taken for needle placement. Patient did have injection of 2 cc of 1% lidocaine, 2 cc of 0.5% Marcaine, and 1.0 cc of Kenalog 40 mg/dL. Completed without difficulty  Pain immediately resolved suggesting accurate placement of the medication.  Advised to call if fevers/chills, erythema, induration, drainage, or persistent bleeding.  Images saved Impression: Technically successful ultrasound guided injection.  After informed written and verbal consent, patient was seated on exam table. Right knee was prepped with alcohol swab and utilizing anterolateral approach, patient's right knee space was injected with 4:1  marcaine 0.5%: Kenalog 40mg /dL. Patient tolerated the procedure well without immediate complications.  After informed written and verbal consent, patient was seated on exam table. Left knee was prepped with alcohol swab and utilizing anterolateral approach, patient's left knee space was injected with 4:1  marcaine 0.5%: Kenalog 40mg /dL. Patient tolerated the procedure well without immediate complications.   Impression and Recommendations:    The above documentation has been reviewed and is accurate and complete Judi Saa, DO

## 2024-02-23 NOTE — Telephone Encounter (Signed)
 I called patient and she said she only has enough to last until Thursday. Is this okay to fill?

## 2024-03-01 ENCOUNTER — Encounter: Payer: Self-pay | Admitting: Family Medicine

## 2024-03-01 ENCOUNTER — Ambulatory Visit: Payer: Medicare Other | Admitting: Family Medicine

## 2024-03-01 ENCOUNTER — Other Ambulatory Visit: Payer: Self-pay

## 2024-03-01 VITALS — BP 118/74 | HR 92 | Ht 66.0 in | Wt 158.0 lb

## 2024-03-01 DIAGNOSIS — M75111 Incomplete rotator cuff tear or rupture of right shoulder, not specified as traumatic: Secondary | ICD-10-CM

## 2024-03-01 DIAGNOSIS — M170 Bilateral primary osteoarthritis of knee: Secondary | ICD-10-CM

## 2024-03-01 DIAGNOSIS — M25511 Pain in right shoulder: Secondary | ICD-10-CM

## 2024-03-01 NOTE — Assessment & Plan Note (Signed)
 Had surgery on the right shoulder previously.  Unfortunately now having worsening pain and could have an intrasubstance tear noted.  Discussed with patient about icing regimen.  Would like to get the surgical report to see if anything else is different.  Discussed which activities to do and which ones to avoid.  Follow-up again in 6 to 8 weeks

## 2024-03-01 NOTE — Assessment & Plan Note (Signed)
 Repeat injection given today.  Chronic problem, discussed icing regimen and home exercises, discussed which activities to do and which ones to avoid.  Increase activity slowly over the course neck several weeks.  Discussed icing regimen.  Follow-up again in 8 to 12 weeks

## 2024-03-01 NOTE — Patient Instructions (Addendum)
 Good to see you. Injected shoulder and knees. 6 weeks no house work or gardening. See you in 6 weeks.

## 2024-03-02 LAB — HM MAMMOGRAPHY

## 2024-03-03 ENCOUNTER — Encounter: Payer: Self-pay | Admitting: Family Medicine

## 2024-03-03 ENCOUNTER — Other Ambulatory Visit: Payer: Self-pay | Admitting: *Deleted

## 2024-03-03 DIAGNOSIS — Z9189 Other specified personal risk factors, not elsewhere classified: Secondary | ICD-10-CM

## 2024-03-03 NOTE — Progress Notes (Signed)
 Received call from pt stating that she would like to proceed with breast MRI this year due to experiencing such high anxiety.  RN reviewed with MD and verbal orders received and placed for breast MRI to be completed in 6 months.

## 2024-03-09 ENCOUNTER — Other Ambulatory Visit: Payer: Self-pay

## 2024-03-09 MED ORDER — MELOXICAM 15 MG PO TABS: 15.0000 mg | ORAL_TABLET | Freq: Every day | ORAL | 0 refills | Status: DC

## 2024-03-15 ENCOUNTER — Encounter: Payer: Self-pay | Admitting: Family Medicine

## 2024-03-21 NOTE — Patient Instructions (Incomplete)
  HEALTH MAINTENANCE RECOMMENDATIONS:  It is recommended that you get at least 30 minutes of aerobic exercise at least 5 days/week (for weight loss, you may need as much as 60-90 minutes). This can be any activity that gets your heart rate up. This can be divided in 10-15 minute intervals if needed, but try and build up your endurance at least once a week.  Weight bearing exercise is also recommended twice weekly.  Eat a healthy diet with lots of vegetables, fruits and fiber.  "Colorful" foods have a lot of vitamins (ie green vegetables, tomatoes, red peppers, etc).  Limit sweet tea, regular sodas and alcoholic beverages, all of which has a lot of calories and sugar.  Up to 1 alcoholic drink daily may be beneficial for women (unless trying to lose weight, watch sugars).  Drink a lot of water.  Calcium  recommendations are 1200-1500 mg daily (1500 mg for postmenopausal women or women without ovaries), and vitamin D  1000 IU daily.  This should be obtained from diet and/or supplements (vitamins), and calcium  should not be taken all at once, but in divided doses.  Monthly self breast exams and yearly mammograms for women over the age of 66 is recommended.  Sunscreen of at least SPF 30 should be used on all sun-exposed parts of the skin when outside between the hours of 10 am and 4 pm (not just when at beach or pool, but even with exercise, golf, tennis, and yard work!)  Use a sunscreen that says "broad spectrum" so it covers both UVA and UVB rays, and make sure to reapply every 1-2 hours.  Remember to change the batteries in your smoke detectors when changing your clock times in the spring and fall. Carbon monoxide detectors are recommended for your home.  Use your seat belt every time you are in a car, and please drive safely and not be distracted with cell phones and texting while driving.   Ms. Drath , Thank you for taking time to come for your Medicare Wellness Visit. I appreciate your ongoing  commitment to your health goals. Please review the following plan we discussed and let me know if I can assist you in the future.   This is a list of the screening recommended for you and due dates:  Health Maintenance  Topic Date Due   Medicare Annual Wellness Visit  03/18/2024   COVID-19 Vaccine (9 - Moderna risk 2024-25 season) 05/02/2024   Flu Shot  06/25/2024   Mammogram  03/02/2025   Colon Cancer Screening  06/15/2028   DTaP/Tdap/Td vaccine (3 - Td or Tdap) 03/17/2031   Pneumonia Vaccine  Completed   DEXA scan (bone density measurement)  Completed   Hepatitis C Screening  Completed   Zoster (Shingles) Vaccine  Completed   HPV Vaccine  Aged Out   Meningitis B Vaccine  Aged Out    You need to be drinking more non-caffeinated fluids. Club soda is fine, if you don't like water. The goal is to see a very pale yellow or clear urine.

## 2024-03-21 NOTE — Progress Notes (Unsigned)
 No chief complaint on file.   Rachel Juarez is a 68 y.o. female who presents for Annual Wellness Visit (initial), and follow-up on chronic medical conditions.    Prediabetes, elevated LFT's. She was started on Metformin XR 500 mg daily by Dr. Arminda Landmark. A1c was 5.8% on 12/26/23.   She denies side effects. She tries to limit her sugar, carbs.  ***UPDATE ALL   LFT's were reportedly normal in January. Lab Results  Component Value Date   ALT 41 (H) 09/22/2023   AST 26 09/22/2023   ALKPHOS 67 09/22/2023   BILITOT 0.4 09/22/2023   She had abdominal US  done 09/2023: IMPRESSION: 1. Increased hepatic echotexture, most commonly seen with steatosis. Correlation with LFT's is recommended. No sonographic evidence hepatic mass lesion.   Depression and Anxiety: She has been on Effexor  since 11/2022 (switched from Citalopram , stopped Wellbutrin  when she started Tamoxifen ). She is taking 150 mg and denies side effects.  Depression had improved on this regimen.  She continues on Buspar  15mg  BID. She has ongoing stressors related to politics. She limits her news watching. She sees a therapist regularly, but retiring soon. Given recommendations for other therapists.   Hyperlipidemia:  Patient is compliant with taking rosuvastatin  10mg  daily, and is tolerating this without side effects.   She continues to try and limit her cheese, eats red meat once a week, french fries up to 2x/week, no other fried foods, occasional eggs (only when out, <1 week). Lipids were at goal on this regimen, due for recheck.  Lab Results  Component Value Date   CHOL 182 03/19/2023   HDL 65 03/19/2023   LDLCALC 96 03/19/2023   TRIG 120 03/19/2023   CHOLHDL 2.8 03/19/2023   Increased risk for breast cancer:  She is currently on 5mg  of tamoxifen , with plans to take it for 5 years (started 11/2021). This is for prevention of breast cancer, under the care of Dr. Lee Public. She has mild hot flashes, no worse than she had  prior to starting therapy.  She denies any breast concerns. She denies any vaginal bleeding or spotting. She is getting yearly breast MRIs and mammograms, 6 months apart.    Vitamin D  deficiency--Level was slightly low on last check (02/2023) when she hadn't been taking D3 (was supposed to take 1000 IU October through March/April), and wasn't as regular in taking her MVI daily. Today she reports she is taking ***  Component Ref Range & Units (hover) 1 yr ago (03/19/23) 2 yr ago (02/14/22) 3 yr ago (07/26/20) 4 yr ago (01/19/20) 7 yr ago (01/13/17) 7 yr ago (05/06/16) 8 yr ago (01/17/16)  Vit D, 25-Hydroxy 29.8 Low  34.6 CM 44.5 CM 126.0 High  CM 36 R, CM 30 R, CM 20 Low  R, CM    Hypothyroidism/multinodular goiter: She is under the care of Dr. Ronelle Coffee.  Denies any changes to hair/skin/bowels/nails.   Lab Results  Component Value Date   TSH 0.875 09/22/2023   Last thyroid  US  was stable, in 01/2022: IMPRESSION: Stable exam of the thyroid  gland with unchanged appearance of a small subcentimeter solitary nodule in the right thyroid  lobe. No new nodules or other findings that warrant further dedicated follow-up or biopsy.  Allergies: Year-round, with seasonal component in Spring/Fall. She is doing well on singulair , flonase  and zyrtec daily, along with OTC eye drops.     Herpes labialis--uses Valtrex  prn.  Last filled #28 in 02/2023.  Macrocytosis.  previously had B12 in the 330 range. MCV was at  100, prev up to 104 per last records.  Pt recalls seeing hematologist in the past.  No work-up was needed. She denies symptoms of anemia, denies numbness, tingling or memory concerns. She hasn't been as good at remembering to take her MVI daily.    She cut back on alcohol to just 3 days/week (rather than daily).   Lab Results  Component Value Date   WBC 6.0 03/19/2023   HGB 14.8 03/19/2023   HCT 45.1 03/19/2023   MCV 101 (H) 03/19/2023   PLT 268 03/19/2023     Immunization History   Administered Date(s) Administered   Fluad Quad(high Dose 65+) 08/25/2023   Influenza, High Dose Seasonal PF 08/25/2023   Influenza,inj,Quad PF,6+ Mos 08/30/2015, 08/21/2020, 08/29/2021   Influenza-Unspecified 07/30/2017, 08/24/2018, 08/20/2019, 08/16/2022   MMR 02/02/2024   Moderna Covid-19 Vaccine Bivalent Booster 4yrs & up 08/15/2021   Moderna Sars-Covid-2 Vaccination 12/07/2019, 01/05/2020, 09/21/2020, 02/28/2021   PNEUMOCOCCAL CONJUGATE-20 03/19/2023   Pfizer(Comirnaty)Fall Seasonal Vaccine 12 years and older 04/03/2023, 11/02/2023   Respiratory Syncytial Virus Vaccine,Recomb Aduvanted(Arexvy) 08/25/2023   Tdap 02/12/2011, 03/16/2021   Unspecified SARS-COV-2 Vaccination 08/16/2022   Zoster Recombinant(Shingrix ) 03/06/2018, 05/08/2018   Last Pap smear: 02/2021, normal, no high risk HPV detected Last mammogram:  02/2024, normal breast MRI 08/2023. Last colonoscopy: 05/2018 Dr. Lavaughn [place], hyperplastic polyps and diverticulosis. Repeat 10 years. She did hemoccult testing sent to her by her insurance in 01/2021. Last DEXA: 02/2022 decreased at L hip (-2.0), spine, R hip stable (Prior 07/2020 T-1.9 L fem neck, improved at R hip, spine and L hip stable). Ordered by Dr. Ronelle Coffee Dentist: twice a year Ophtho: Yearly Exercise:   personal trainer 2x/week, gardening. Yoga once a week.  Walks 45-60 minutes once a week.   Hepatitis C screening--previously donated blood regularly  Patient Care Team: Roosvelt Colla, MD as PCP - General (Family Medicine) Ophtho: Dr. Alveda Aures Ortho: Dr. Dalldorf Heme-Onc: Dr. Lee Public Endo: Dr. Ronelle Coffee GI:  Dr. Lavaughn [place] Derm: Roslyn Coombe (prev saw Dr. Steen Eden) Sports Med: Dr. Napolean Backbone Dentist: Dr. Suellen Emms   Depression Screening: Flowsheet Row Video Visit from 05/14/2023 in [facility]  PHQ-2 Total Score 0       Flowsheet Row Office Visit from 03/19/2023 in [facility]  PHQ-9 Total Score 2       Falls screen:     03/19/2023   10:14 AM  03/13/2022    9:45 AM 02/14/2022   10:18 AM 03/05/2021    9:39 AM  Fall Risk   Falls in the past year? 0 1 1 1   Number falls in past yr: 0 0 0 0  Comment  June 2022 June 22 left leg injured   Injury with Fall? 0 1 1 1   Comment  popped ankle  foot injury  Risk for fall due to : No Fall Risks No Fall Risks History of fall(s) Other (Comment)  Risk for fall due to: Comment    missed a few steps  Follow up Falls evaluation completed Falls evaluation completed Falls evaluation completed Falls evaluation completed     Functional Status Survey:         End of Life Discussion:  Patient has a living will and medical power of attorney, in chart.   PMH, PSH, SH and FH were reviewed and updated     ROS:  The patient denies, fever, weight changes, headaches,  vision changes, decreased hearing, ear pain, sore throat, breast concerns, chest pain, palpitations, dizziness, syncope, dyspnea on exertion, cough,  swelling, nausea, vomiting, diarrhea, constipation, abdominal pain, melena, hematochezia, indigestion/heartburn, hematuria, incontinence, dysuria, vaginal bleeding, discharge, odor or itch, genital lesions, joint pains, numbness, tingling, weakness, tremor, suspicious skin lesions, abnormal bleeding or enlarged lymph nodes. Pain with intercourse, vaginal dryness. Mild, infrequent hot flashes. Allergies, controlled. Depression/anxiety per HPI Weight loss   PHYSICAL EXAM:  LMP 11/19/2007   Wt Readings from Last 3 Encounters:  03/01/24 158 lb (71.7 kg)  12/24/23 160 lb 11.2 oz (72.9 kg)  12/22/23 162 lb (73.5 kg)    General Appearance:    Alert, cooperative, no distress, appears stated age.   Head:    Normocephalic, without obvious abnormality, atraumatic  Eyes:    PERRL, conjunctiva/corneas clear, EOM's intact, fundi benign  Ears:    Narrow canals. R TM and EAC normal.  L was obscured by cerumen. ***  Nose:   Normal, no drainage, mild mucosal edema. No sinus tenderness  Throat:    Normal no mucosal lesions  Neck:   Supple, no lymphadenopathy;  thyroid : mildly enlarged, nodular, slightly more prominent on the right; no carotid bruit or JVD  Back:    Spine nontender, no curvature, ROM normal, no CVA  tenderness  Lungs:     Clear to auscultation bilaterally without wheezes, rales or  ronchi; respirations unlabored  Chest Wall:    No tenderness or deformity   Heart:    Regular rate and rhythm, S1 and S2 normal, no murmur, rub   or gallop  Breast Exam:   No dimpling, nipple discharge or inversion. No axillary lymphadenopathy.   Abdomen:     Soft, non-tender, nondistended, normoactive bowel sounds,    no masses, no hepatosplenomegaly  Genitalia:    Normal external genitalia without lesions; mild atrophic changes.  BUS and vagina normal; no cervical motion tenderness. No abnormal vaginal discharge.  Uterus and adnexa not enlarged, nontender, no masses. Pap not performed  Rectal:    Normal tone, no masses or tenderness; guaiac negative stool  Extremities:   No clubbing, cyanosis or edema.   Pulses:   2+ and symmetric all extremities  Skin:   Skin color, texture, turgor normal, no rashes or lesions.   Lymph nodes:   Cervical, supraclavicular, inguinal and axillary nodes normal  Neurologic:   Normal strength, sensation and gait; reflexes 2+ and symmetric throughout                               Psych:  Normal mood, affect, hygiene and grooming  ***UPDATE ear, L cerumen?     05/14/2023    1:31 PM 03/19/2023   10:23 AM 02/12/2023    1:45 PM 11/28/2022    1:27 PM 03/13/2022    9:52 AM  Depression screen PHQ 2/9  Decreased Interest 0 0 3 2 1   Down, Depressed, Hopeless 0 0 1 2 1   PHQ - 2 Score 0 0 4 4 2   Altered sleeping  0 3 1 1   Tired, decreased energy  1 1 2 1   Change in appetite  0 1 2 0  Feeling bad or failure about yourself   0 3 2 1   Trouble concentrating  1 0 3 0  Moving slowly or fidgety/restless  0 0 0 0  Suicidal thoughts  0 0 0 0  PHQ-9 Score  2 12 14 5    Difficult doing work/chores  Somewhat difficult Somewhat difficult Somewhat difficult Not difficult at all  05/14/2023    1:32 PM 03/19/2023   10:25 AM 02/12/2023    1:41 PM 11/28/2022    1:27 PM  GAD 7 : Generalized Anxiety Score  Nervous, Anxious, on Edge 0 0 1 1  Control/stop worrying 0 0 0 1  Worry too much - different things 0 0 0 1  Trouble relaxing 0 0 1 0  Restless 0 0 0 0  Easily annoyed or irritable 0 0 2 3  Afraid - awful might happen 0 0 1 3  Total GAD 7 Score 0 0 5 9  Anxiety Difficulty  Not difficult at all Not difficult at all Somewhat difficult   No results found for: "HGBA1C"   ASSESSMENT/PLAN:  Gad-7, phq9 A1c today Does she still see Dr. Ronelle Coffee??  We have no notes.  She had TSH last checked in 08/2023. Not sure that we need to do that today (if Balan does)  Valtrex  RF needed? Has she had recent DEXA at [facility] (she had mammo earlier this month)?  Last 02/2022, due for f/u. Previously ordered by Dr. Ronelle Coffee, not us .   ***REFILL CRESTOR  AFTER LABS BACK   Discussed monthly self breast exams and yearly mammograms (also getting yearly breast MRI); at least 30 minutes of aerobic activity at least 5 days/week, weight-bearing exercise at least 2x/wk; proper sunscreen use reviewed; healthy diet, including goals of calcium  and vitamin D  intake and alcohol recommendations (less than or equal to 1 drink/day) reviewed; regular seatbelt use; changing batteries in smoke detectors. Immunization recommendations discussed--continue yearly flu shots.  Colonoscopy UTD, due again 05/2028 (can consider Cologuard at that time instead) DEXA due    MOST form discussed and completed, full code, full care.  Medicare Attestation I have personally reviewed: The patient's medical and social history Their use of alcohol, tobacco or illicit drugs Their current medications and supplements The patient's functional ability including ADLs,fall risks, home safety risks, cognitive, and hearing  and visual impairment Diet and physical activities Evidence for depression or mood disorders  The patient's weight, height, BMI have been recorded in the chart.  I have made referrals, counseling, and provided education to the patient based on review of the above and I have provided the patient with a written personalized care plan for preventive services.     Paulett Boros, MD

## 2024-03-22 ENCOUNTER — Ambulatory Visit: Payer: Medicare Other | Admitting: Family Medicine

## 2024-03-22 ENCOUNTER — Encounter: Payer: Self-pay | Admitting: Family Medicine

## 2024-03-22 ENCOUNTER — Ambulatory Visit: Admitting: Family Medicine

## 2024-03-22 VITALS — BP 124/80 | HR 72 | Ht 66.0 in | Wt 152.8 lb

## 2024-03-22 DIAGNOSIS — Z0000 Encounter for general adult medical examination without abnormal findings: Secondary | ICD-10-CM

## 2024-03-22 DIAGNOSIS — F411 Generalized anxiety disorder: Secondary | ICD-10-CM

## 2024-03-22 DIAGNOSIS — B001 Herpesviral vesicular dermatitis: Secondary | ICD-10-CM

## 2024-03-22 DIAGNOSIS — E559 Vitamin D deficiency, unspecified: Secondary | ICD-10-CM

## 2024-03-22 DIAGNOSIS — J302 Other seasonal allergic rhinitis: Secondary | ICD-10-CM

## 2024-03-22 DIAGNOSIS — E7800 Pure hypercholesterolemia, unspecified: Secondary | ICD-10-CM

## 2024-03-22 DIAGNOSIS — Z5181 Encounter for therapeutic drug level monitoring: Secondary | ICD-10-CM

## 2024-03-22 DIAGNOSIS — M85852 Other specified disorders of bone density and structure, left thigh: Secondary | ICD-10-CM

## 2024-03-22 DIAGNOSIS — F419 Anxiety disorder, unspecified: Secondary | ICD-10-CM

## 2024-03-22 DIAGNOSIS — E039 Hypothyroidism, unspecified: Secondary | ICD-10-CM

## 2024-03-22 DIAGNOSIS — K760 Fatty (change of) liver, not elsewhere classified: Secondary | ICD-10-CM

## 2024-03-22 DIAGNOSIS — R7303 Prediabetes: Secondary | ICD-10-CM | POA: Diagnosis not present

## 2024-03-22 DIAGNOSIS — F325 Major depressive disorder, single episode, in full remission: Secondary | ICD-10-CM

## 2024-03-22 DIAGNOSIS — D7589 Other specified diseases of blood and blood-forming organs: Secondary | ICD-10-CM

## 2024-03-22 DIAGNOSIS — E042 Nontoxic multinodular goiter: Secondary | ICD-10-CM

## 2024-03-22 LAB — POCT GLYCOSYLATED HEMOGLOBIN (HGB A1C): Hemoglobin A1C: 5.2 % (ref 4.0–5.6)

## 2024-03-22 LAB — HM DEXA SCAN

## 2024-03-22 LAB — LIPID PANEL

## 2024-03-22 MED ORDER — MONTELUKAST SODIUM 10 MG PO TABS
ORAL_TABLET | ORAL | 3 refills | Status: DC
Start: 2024-03-22 — End: 2024-05-24

## 2024-03-22 MED ORDER — VALACYCLOVIR HCL 1 G PO TABS: ORAL_TABLET | ORAL | 0 refills | Status: DC

## 2024-03-22 MED ORDER — BUSPIRONE HCL 15 MG PO TABS
15.0000 mg | ORAL_TABLET | Freq: Two times a day (BID) | ORAL | 1 refills | Status: DC
Start: 2024-03-22 — End: 2024-03-24

## 2024-03-22 MED ORDER — VENLAFAXINE HCL ER 150 MG PO CP24
150.0000 mg | ORAL_CAPSULE | Freq: Every day | ORAL | 1 refills | Status: DC
Start: 2024-03-22 — End: 2024-05-24

## 2024-03-23 ENCOUNTER — Encounter: Payer: Self-pay | Admitting: Family Medicine

## 2024-03-23 LAB — LIPID PANEL
Cholesterol, Total: 177 mg/dL (ref 100–199)
HDL: 75 mg/dL (ref >39)
LDL CALC COMMENT:: 2.4 ratio (ref 0.0–4.4)
LDL Chol Calc (NIH): 89 mg/dL (ref 0–99)
Triglycerides: 71 mg/dL (ref 0–149)
VLDL Cholesterol Cal: 13 mg/dL (ref 5–40)

## 2024-03-23 LAB — CMP14+EGFR
ALT: 26 IU/L (ref 0–32)
AST: 19 IU/L (ref 0–40)
Albumin: 4.5 g/dL (ref 3.9–4.9)
Alkaline Phosphatase: 61 IU/L (ref 44–121)
BUN/Creatinine Ratio: 13 (ref 12–28)
BUN: 10 mg/dL (ref 8–27)
Bilirubin Total: 0.5 mg/dL (ref 0.0–1.2)
CO2: 25 mmol/L (ref 20–29)
Calcium: 9.7 mg/dL (ref 8.7–10.3)
Chloride: 103 mmol/L (ref 96–106)
Creatinine, Ser: 0.78 mg/dL (ref 0.57–1.00)
Globulin, Total: 2.2 g/dL (ref 1.5–4.5)
Glucose: 85 mg/dL (ref 70–99)
Potassium: 4.6 mmol/L (ref 3.5–5.2)
Sodium: 141 mmol/L (ref 134–144)
Total Protein: 6.7 g/dL (ref 6.0–8.5)
eGFR: 84 mL/min/{1.73_m2} (ref >59)

## 2024-03-23 LAB — VITAMIN D 25 HYDROXY (VIT D DEFICIENCY, FRACTURES): Vit D, 25-Hydroxy: 36.7 ng/mL (ref 30.0–100.0)

## 2024-03-23 LAB — CBC WITH DIFFERENTIAL/PLATELET
Basophils Absolute: 0 10*3/uL (ref 0.0–0.2)
Basos: 1 %
EOS (ABSOLUTE): 0.1 10*3/uL (ref 0.0–0.4)
Eos: 2 %
Hematocrit: 42.9 % (ref 34.0–46.6)
Hemoglobin: 14.2 g/dL (ref 11.1–15.9)
Immature Grans (Abs): 0 10*3/uL (ref 0.0–0.1)
Immature Granulocytes: 0 %
Lymphocytes Absolute: 1.8 10*3/uL (ref 0.7–3.1)
Lymphs: 33 %
MCH: 32.7 pg (ref 26.6–33.0)
MCHC: 33.1 g/dL (ref 31.5–35.7)
MCV: 99 fL — ABNORMAL HIGH (ref 79–97)
Monocytes Absolute: 0.3 10*3/uL (ref 0.1–0.9)
Monocytes: 6 %
Neutrophils Absolute: 3 10*3/uL (ref 1.4–7.0)
Neutrophils: 58 %
Platelets: 229 10*3/uL (ref 150–450)
RBC: 4.34 x10E6/uL (ref 3.77–5.28)
RDW: 12.2 % (ref 11.7–15.4)
WBC: 5.3 10*3/uL (ref 3.4–10.8)

## 2024-03-23 LAB — TSH: TSH: 0.784 u[IU]/mL (ref 0.450–4.500)

## 2024-03-23 LAB — VITAMIN B12: Vitamin B-12: 320 pg/mL (ref 232–1245)

## 2024-03-23 MED ORDER — ROSUVASTATIN CALCIUM 10 MG PO TABS: 10.0000 mg | ORAL_TABLET | Freq: Every day | ORAL | 3 refills | Status: AC

## 2024-03-24 ENCOUNTER — Other Ambulatory Visit: Payer: Self-pay | Admitting: Family Medicine

## 2024-03-24 DIAGNOSIS — F411 Generalized anxiety disorder: Secondary | ICD-10-CM

## 2024-03-26 ENCOUNTER — Encounter: Payer: Self-pay | Admitting: Family Medicine

## 2024-03-26 DIAGNOSIS — E039 Hypothyroidism, unspecified: Secondary | ICD-10-CM

## 2024-04-01 ENCOUNTER — Other Ambulatory Visit: Payer: Self-pay | Admitting: *Deleted

## 2024-04-01 ENCOUNTER — Encounter: Payer: Self-pay | Admitting: Family Medicine

## 2024-04-01 DIAGNOSIS — E039 Hypothyroidism, unspecified: Secondary | ICD-10-CM

## 2024-04-12 NOTE — Progress Notes (Signed)
 [facility] [address] [place] [address] Phone: [phone] Subjective:   Rachel Juarez, am serving as a scribe for Dr. Ronnell Coins.  I'm seeing this patient by the request  of:  Rachel Colla, MD  CC: Bilateral knee and left shoulder pain  ION:[id number]  03/01/2024 Had surgery on the right shoulder previously.  Unfortunately now having worsening pain and could have an intrasubstance tear noted.  Discussed with patient about icing regimen.  Would like to get the surgical report to see if anything else is different.  Discussed which activities to do and which ones to avoid.  Follow-up again in 6 to 8 weeks     Repeat injection given today.  Chronic problem, discussed icing regimen and home exercises, discussed which activities to do and which ones to avoid.  Increase activity slowly over the course neck several weeks.  Discussed icing regimen.  Follow-up again in 8 to 12 weeks     Updated 04/14/2024 Rachel Juarez is a 68 y.o. female coming in with complaint of B knee and L shoulder pain. Knees doing well. Shoulder is doing better. Lifting overhead still problematic. No bearing weight at this time. More discomfort than pain.       Past Medical History:  Diagnosis Date   Allergy    Anxiety    Depression, major, in remission (HCC) 2006   when daughter dx'd with autoimmune disorder   Family history of breast cancer    Family history of cancer tonsil    Family history of thyroid  cancer    Generalized anxiety disorder    generalized, and related to mammograms and flying   Glaucoma suspect    sees Dr. Candi Chafe, has all been okay, sees him qoyr   Herpes labialis    Hypercholesterolemia    high HDL, borderline LDL   Hypothyroidism    treated by Dr. Ronelle Coffee   Multinodular goiter    followed by Dr. Ronelle Coffee; u/s q2 yr   PPD positive 1988   treated x 6 mos of INH   Seasonal allergies    takes singulair  year-round; worse in the Fall   Past  Surgical History:  Procedure Laterality Date   CATARACT EXTRACTION, BILATERAL Bilateral June/July 2021   Dr. Alveda Aures   KNEE ARTHROSCOPY Right 02/21/2022   for torn medial meniscus (Dr. Ana Balling)   SHOULDER ARTHROSCOPY Right 11/11/2023   Dr. Deeann Fare   Social History   Socioeconomic History   Marital status: Married    Spouse name: Not on file   Number of children: Not on file   Years of education: Not on file   Highest education level: Not on file  Occupational History   Occupation: yoga teacher  Tobacco Use   Smoking status: Never   Smokeless tobacco: Never  Vaping Use   Vaping status: Never Used  Substance and Sexual Activity   Alcohol use: Yes    Comment: 2.5 drinks/week   Drug use: No   Sexual activity: Yes    Partners: Male    Birth control/protection: Post-menopausal  Other Topics Concern   Not on file  Social History Narrative   Pre-COVID, taught yoga at multiple places.    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, corporate    Volunteers  at the [employer]      Updated 02/2024   Social Drivers of Health   Financial Resource Strain: Patient Declined (03/22/2024)   Overall Financial Resource Strain (CARDIA)    Difficulty of Paying Living Expenses: Patient declined  Food Insecurity: Patient Declined (03/22/2024)   Hunger Vital Sign    Worried About Running Out of Food in the Last Year: Patient declined    Ran Out of Food in the Last Year: Patient declined  Transportation Needs: Patient Declined (03/22/2024)   PRAPARE - Administrator, Civil Service (Medical): Patient declined    Lack of Transportation (Non-Medical): Patient declined  Physical Activity: Patient Declined (03/22/2024)   Exercise Vital Sign    Days of Exercise per Week: Patient declined     Minutes of Exercise per Session: Patient declined  Stress: Patient Declined (03/22/2024)   [employer] of Occupational Health - Occupational Stress Questionnaire    Feeling of Stress : Patient declined  Social Connections: Patient Declined (03/22/2024)   Social Connection and Isolation Panel [NHANES]    Frequency of Communication with Friends and Family: Patient declined    Frequency of Social Gatherings with Friends and Family: Patient declined    Attends Religious Services: Patient declined    Database administrator or Organizations: Patient declined    Attends Engineer, structural: Patient declined    Marital Status: Patient declined   Allergies  Allergen Reactions   Demerol [Meperidine] Other (See Comments)    Low bp, passes out   Family History  Problem Relation Age of Onset   Lupus Mother    Cancer Father 32       thymic cancer   COPD Father    Heart disease Father        CHF   Asthma Father    Cancer Sister    Breast cancer Sister 82       "best kind", lumpectomy and radiation   Colon cancer Maternal Grandmother        80's   Cancer Maternal Grandmother    Hypertension Paternal Grandmother    Hyperlipidemia Paternal Grandmother    Thyroid  disease Paternal Grandmother        had goiter removed   Stroke Paternal Grandmother 39   Hypertension Paternal Grandfather    Hyperlipidemia Paternal Grandfather    Heart disease Paternal Grandfather        MI first in his 78's   Stroke Paternal Grandfather    Dermatomyositis Daughter 41       juvenile form   Irritable bowel syndrome Daughter    Breast cancer Paternal Aunt 72   Cancer Paternal Uncle        started on tonsils (smoker)   Breast cancer Cousin 68   Breast cancer Cousin 8       (the mother of the 61 y/o cousin with br ca   Thyroid  cancer Cousin    Diabetes Neg Hx    Ovarian cancer Neg Hx     Current Outpatient Medications (Endocrine & Metabolic):    levothyroxine (SYNTHROID, LEVOTHROID) 75  MCG tablet, Take 75 mcg by mouth daily before breakfast.  Current Outpatient Medications (Cardiovascular):    rosuvastatin  (CRESTOR ) 10 MG tablet, Take 1 tablet (10 mg total) by mouth daily.  Current Outpatient Medications (Respiratory):    cetirizine (ZYRTEC) 10 MG tablet, Take 10 mg by mouth daily.   fluticasone  (FLONASE ) 50 MCG/ACT nasal spray, Place 2 sprays into both nostrils daily.  montelukast  (SINGULAIR ) 10 MG tablet, TAKE 1 TABLET BY MOUTH EVERYDAY AT BEDTIME  Current Outpatient Medications (Analgesics):    meloxicam  (MOBIC ) 15 MG tablet, Take 1 tablet (15 mg total) by mouth daily.   Current Outpatient Medications (Other):    ALPRAZolam  (XANAX ) 0.5 MG tablet, Take 0.5-1 tablets (0.25-0.5 mg total) by mouth 3 (three) times daily as needed for anxiety or sleep. (Patient not taking: Reported on 03/22/2024)   busPIRone  (BUSPAR ) 15 MG tablet, TAKE 1 TABLET BY MOUTH 2 TIMES A DAY   Calcium  250 MG CAPS, Take 1-2 capsules by mouth daily. (Patient not taking: Reported on 03/22/2024)   cholecalciferol (VITAMIN D3) 25 MCG (1000 UNIT) tablet, Take 1,000 Units by mouth daily.   hydrocortisone  2.5 % ointment, Apply topically 2 (two) times daily. (Patient not taking: Reported on 03/22/2024)   Magnesium 500 MG TABS, Take 1 tablet by mouth daily.   MILK THISTLE PO, Take 1 capsule by mouth daily.   Misc Natural Products (TART CHERRY ADVANCED PO), Take 1 capsule by mouth daily.   mupirocin  ointment (BACTROBAN ) 2 %, Apply 1 application topically 2 (two) times daily. (Patient not taking: Reported on 03/22/2024)   NON FORMULARY, Take 2 capsules by mouth daily.   NON FORMULARY, Take 2 capsules by mouth daily.   Omega-3 Fatty Acids (FISH OIL) 1000 MG CPDR, Take 2,000 mg by mouth daily.   tamoxifen  (NOLVADEX ) 10 MG tablet, TAKE 1/2 TABLET BY MOUTH DAILY   triamcinolone  cream (KENALOG ) 0.5 %, Apply topically 3 (three) times daily. (Patient not taking: Reported on 03/22/2024)   valACYclovir  (VALTREX ) 1000 MG  tablet, TAKE 2 TABLETS AT ONSET OF COLD SORE. REPEAT ONCE IN 12 HOURS (4 PILLS/COURSE)   venlafaxine  XR (EFFEXOR  XR) 150 MG 24 hr capsule, Take 1 capsule (150 mg total) by mouth daily with breakfast.   vitamin C (ASCORBIC ACID) 500 MG tablet, Take 500 mg by mouth daily.   Reviewed prior external information including notes and imaging from  primary care provider As well as notes that were available from care everywhere and other healthcare systems.  Past medical history, social, surgical and family history all reviewed in electronic medical record.  No pertanent information unless stated regarding to the chief complaint.   Review of Systems:  No headache, visual changes, nausea, vomiting, diarrhea, constipation, dizziness, abdominal pain, skin rash, fevers, chills, night sweats, weight loss, swollen lymph nodes, body aches, joint swelling, chest pain, shortness of breath, mood changes. POSITIVE muscle aches  Objective  Blood pressure 104/70, pulse 80, height 5\' 6"  (1.676 m), last menstrual period 11/19/2007, SpO2 96%.   General: No apparent distress alert and oriented x3 mood and affect normal, dressed appropriately.  HEENT: Pupils equal, extraocular movements intact  Respiratory: Patient's speak in full sentences and does not appear short of breath  Cardiovascular: No lower extremity edema, non tender, no erythema  Patient does have some good range of motion but still lacking the last 5 degrees of internal and external range of motion.  Lacks 5 degrees of flexion noted as well.  Rotator cuff strength 4+ out of 5.  Limited muscular skeletal ultrasound was performed and interpreted by Ronnell Coins, M  Limited ultrasound shows significant less hypoechoic changes but mild Biceps tendon area.  Patient does not have any true tear but a questionable irregularity of the anterior labrum.  Patient supraspinatus does have the postsurgical changes with calcific changes that is likely still scar  tissue.  Difficult to assess the anchors above this area.  No true tearing noted at this point.     Impression and Recommendations:    The above documentation has been reviewed and is accurate and complete Ronney [employer]well M Kaynen Minner, DO

## 2024-04-14 ENCOUNTER — Encounter: Payer: Self-pay | Admitting: Family Medicine

## 2024-04-14 ENCOUNTER — Other Ambulatory Visit: Payer: Self-pay

## 2024-04-14 ENCOUNTER — Ambulatory Visit (INDEPENDENT_AMBULATORY_CARE_PROVIDER_SITE_OTHER): Admitting: Family Medicine

## 2024-04-14 VITALS — BP 104/70 | HR 80 | Ht 66.0 in

## 2024-04-14 DIAGNOSIS — M25511 Pain in right shoulder: Secondary | ICD-10-CM

## 2024-04-14 DIAGNOSIS — M75111 Incomplete rotator cuff tear or rupture of right shoulder, not specified as traumatic: Secondary | ICD-10-CM | POA: Diagnosis not present

## 2024-04-14 NOTE — Patient Instructions (Signed)
 Good to see you! Okay to lift Keep hands in peripheral vision See you again in June for knees

## 2024-04-14 NOTE — Assessment & Plan Note (Signed)
 Significant improvement at the moment.  Discussed icing regimen of home exercises, keeping a peripheral vision in hands within the peripheral vision medicine can be the most beneficial.  Discussed icing regimen and home exercises.  Increase activity slowly.  Follow-up again in 6 to 8 weeks

## 2024-04-21 ENCOUNTER — Other Ambulatory Visit: Payer: Self-pay | Admitting: Family Medicine

## 2024-04-21 DIAGNOSIS — F411 Generalized anxiety disorder: Secondary | ICD-10-CM

## 2024-04-29 ENCOUNTER — Other Ambulatory Visit: Payer: Self-pay | Admitting: *Deleted

## 2024-04-29 MED ORDER — LEVOTHYROXINE SODIUM 75 MCG PO TABS: 75.0000 ug | ORAL_TABLET | Freq: Every day | ORAL | 0 refills | Status: DC

## 2024-05-10 NOTE — Progress Notes (Signed)
 [facility] [address] [place] [address] Phone: [phone] Subjective:   Rachel Juarez am a scribe for Dr. Felipe Horton and Dr. Cleora Daft.   I'm seeing this patient by the request  of:  Roosvelt Colla, MD  CC: Knee pain  [id number]  04/14/2024 Significant improvement at the moment.  Discussed icing regimen of home exercises, keeping a peripheral vision in hands within the peripheral vision medicine can be the most beneficial.  Discussed icing regimen and home exercises.  Increase activity slowly.  Follow-up again in 6 to 8 weeks     Updated 05/11/2024 Rachel Juarez is a 67 y.o. female coming in with complaint of knee pain. Patient states that the left knee is cranky. Wants to get injections today.       Past Medical History:  Diagnosis Date   Allergy    Anxiety    Depression, major, in remission (HCC) 2006   when daughter dx'd with autoimmune disorder   Family history of breast cancer    Family history of cancer tonsil    Family history of thyroid  cancer    Generalized anxiety disorder    generalized, and related to mammograms and flying   Glaucoma suspect    sees Dr. Candi Chafe, has all been okay, sees him qoyr   Herpes labialis    Hypercholesterolemia    high HDL, borderline LDL   Hypothyroidism    treated by Dr. Ronelle Coffee   Multinodular goiter    followed by Dr. Ronelle Coffee; u/s q2 yr   PPD positive 1988   treated x 6 mos of INH   Seasonal allergies    takes singulair  year-round; worse in the Fall   Past Surgical History:  Procedure Laterality Date   CATARACT EXTRACTION, BILATERAL Bilateral June/July 2021   Dr. Alveda Aures   KNEE ARTHROSCOPY Right 02/21/2022   for torn medial meniscus (Dr. Ana Balling)   SHOULDER ARTHROSCOPY Right 11/11/2023   Dr. Deeann Fare   Social History   Socioeconomic History   Marital status: Married    Spouse name: Not on file   Number of children: Not on file   Years of education: Not on file   Highest  education level: Not on file  Occupational History   Occupation: yoga teacher  Tobacco Use   Smoking status: Never   Smokeless tobacco: Never  Vaping Use   Vaping status: Never Used  Substance and Sexual Activity   Alcohol use: Yes    Comment: 2.5 drinks/week   Drug use: No   Sexual activity: Yes    Partners: Male    Birth control/protection: Post-menopausal  Other Topics Concern   Not on file  Social History Narrative   Pre-COVID, taught yoga at multiple places.    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, corporate    Volunteers at the [employer]      Updated 02/2024   Social Drivers of Health   Financial Resource Strain: Patient Declined (03/22/2024)   Overall Financial Resource Strain (CARDIA)    Difficulty of Paying Living Expenses: Patient declined  Food Insecurity: Patient Declined (03/22/2024)   Hunger Vital Sign    Worried About Running Out of Food in the Last Year: Patient declined    Ran Out of Food in the Last  Year: Patient declined  Transportation Needs: Patient Declined (03/22/2024)   PRAPARE - Administrator, Civil Service (Medical): Patient declined    Lack of Transportation (Non-Medical): Patient declined  Physical Activity: Patient Declined (03/22/2024)   Exercise Vital Sign    Days of Exercise per Week: Patient declined    Minutes of Exercise per Session: Patient declined  Stress: Patient Declined (03/22/2024)   [employer] of Occupational Health - Occupational Stress Questionnaire    Feeling of Stress : Patient declined  Social Connections: Patient Declined (03/22/2024)   Social Connection and Isolation Panel    Frequency of Communication with Friends and Family: Patient declined    Frequency of Social Gatherings with Friends and Family:  Patient declined    Attends Religious Services: Patient declined    Database administrator or Organizations: Patient declined    Attends Engineer, structural: Patient declined    Marital Status: Patient declined   Allergies  Allergen Reactions   Demerol [Meperidine] Other (See Comments)    Low bp, passes out   Family History  Problem Relation Age of Onset   Lupus Mother    Cancer Father 13       thymic cancer   COPD Father    Heart disease Father        CHF   Asthma Father    Cancer Sister    Breast cancer Sister 55       best kind, lumpectomy and radiation   Colon cancer Maternal Grandmother        80's   Cancer Maternal Grandmother    Hypertension Paternal Grandmother    Hyperlipidemia Paternal Grandmother    Thyroid  disease Paternal Grandmother        had goiter removed   Stroke Paternal Grandmother 29   Hypertension Paternal Grandfather    Hyperlipidemia Paternal Grandfather    Heart disease Paternal Grandfather        MI first in his 18's   Stroke Paternal Grandfather    Dermatomyositis Daughter 64       juvenile form   Irritable bowel syndrome Daughter    Breast cancer Paternal Aunt 15   Cancer Paternal Uncle        started on tonsils (smoker)   Breast cancer Cousin 56   Breast cancer Cousin 27       (the mother of the 67 y/o cousin with br ca   Thyroid  cancer Cousin    Diabetes Neg Hx    Ovarian cancer Neg Hx     Current Outpatient Medications (Endocrine & Metabolic):    levothyroxine  (SYNTHROID ) 75 MCG tablet, Take 1 tablet (75 mcg total) by mouth daily before breakfast.  Current Outpatient Medications (Cardiovascular):    rosuvastatin  (CRESTOR ) 10 MG tablet, Take 1 tablet (10 mg total) by mouth daily.  Current Outpatient Medications (Respiratory):    cetirizine (ZYRTEC) 10 MG tablet, Take 10 mg by mouth daily.   fluticasone  (FLONASE ) 50 MCG/ACT nasal spray, Place 2 sprays into both nostrils daily.   montelukast  (SINGULAIR ) 10 MG tablet,  TAKE 1 TABLET BY MOUTH EVERYDAY AT BEDTIME  Current Outpatient Medications (Analgesics):    meloxicam  (MOBIC ) 15 MG tablet, Take 1 tablet (15 mg total) by mouth daily.   Current Outpatient Medications (Other):    ALPRAZolam  (XANAX ) 0.5 MG tablet, Take 0.5-1 tablets (0.25-0.5 mg total) by mouth 3 (three) times daily as needed for anxiety or sleep.   busPIRone  (BUSPAR ) 15 MG tablet, TAKE 1 TABLET  BY MOUTH 2 TIMES A DAY   Calcium  250 MG CAPS, Take 1-2 capsules by mouth daily.   cholecalciferol (VITAMIN D3) 25 MCG (1000 UNIT) tablet, Take 1,000 Units by mouth daily.   hydrocortisone  2.5 % ointment, Apply topically 2 (two) times daily.   Magnesium 500 MG TABS, Take 1 tablet by mouth daily.   MILK THISTLE PO, Take 1 capsule by mouth daily.   Misc Natural Products (TART CHERRY ADVANCED PO), Take 1 capsule by mouth daily.   mupirocin  ointment (BACTROBAN ) 2 %, Apply 1 application topically 2 (two) times daily.   NON FORMULARY, Take 2 capsules by mouth daily.   NON FORMULARY, Take 2 capsules by mouth daily.   Omega-3 Fatty Acids (FISH OIL) 1000 MG CPDR, Take 2,000 mg by mouth daily.   tamoxifen  (NOLVADEX ) 10 MG tablet, TAKE 1/2 TABLET BY MOUTH DAILY   triamcinolone  cream (KENALOG ) 0.5 %, Apply topically 3 (three) times daily.   valACYclovir  (VALTREX ) 1000 MG tablet, TAKE 2 TABLETS AT ONSET OF COLD SORE. REPEAT ONCE IN 12 HOURS (4 PILLS/COURSE)   venlafaxine  XR (EFFEXOR  XR) 150 MG 24 hr capsule, Take 1 capsule (150 mg total) by mouth daily with breakfast.   vitamin C (ASCORBIC ACID) 500 MG tablet, Take 500 mg by mouth daily.   Reviewed prior external information including notes and imaging from  primary care provider As well as notes that were available from care everywhere and other healthcare systems.  Past medical history, social, surgical and family history all reviewed in electronic medical record.  No pertanent information unless stated regarding to the chief complaint.   Review of  Systems:  No headache, visual changes, nausea, vomiting, diarrhea, constipation, dizziness, abdominal pain, skin rash, fevers, chills, night sweats, weight loss, swollen lymph nodes, body aches, joint swelling, chest pain, shortness of breath, mood changes. POSITIVE muscle aches  Objective  Blood pressure 122/70, pulse 99, height 5' 6 (1.676 m), weight 153 lb (69.4 kg), last menstrual period 11/19/2007, SpO2 99%.   General: No apparent distress alert and oriented x3 mood and affect normal, dressed appropriately.  HEENT: Pupils equal, extraocular movements intact  Respiratory: Patient's speak in full sentences and does not appear short of breath  Cardiovascular: No lower extremity edema, non tender, no erythema  Knee exam bilaterally does have some crepitus noted.  Trace effusion noted.  Instability with valgus and varus force.  After informed written and verbal consent, patient was seated on exam table. Right knee was prepped with alcohol swab and utilizing anterolateral approach, patient's right knee space was injected with 4:1  marcaine 0.5%: Kenalog  40mg /dL. Patient tolerated the procedure well without immediate complications.  After informed written and verbal consent, patient was seated on exam table. Left knee was prepped with alcohol swab and utilizing anterolateral approach, patient's left knee space was injected with 4:1  marcaine 0.5%: Kenalog  40mg /dL. Patient tolerated the procedure well without immediate complications.    Impression and Recommendations:    The above documentation has been reviewed and is accurate and complete Torri Michalski M Djuan Talton, DO

## 2024-05-11 ENCOUNTER — Ambulatory Visit: Admitting: Family Medicine

## 2024-05-11 ENCOUNTER — Encounter: Payer: Self-pay | Admitting: Family Medicine

## 2024-05-11 VITALS — BP 122/70 | HR 99 | Ht 66.0 in | Wt 153.0 lb

## 2024-05-11 DIAGNOSIS — M170 Bilateral primary osteoarthritis of knee: Secondary | ICD-10-CM

## 2024-05-11 NOTE — Patient Instructions (Addendum)
 Good to see you. Bilateral knee injections No deep squats or lunges or step ups. See me again in 10 to 12 weeks.

## 2024-05-11 NOTE — Assessment & Plan Note (Signed)
 Chronic problem with exacerbation again.  Discussed icing regimen and home exercises, which activities to do and which ones to avoid.  Increase activity slowly.  Discussed icing regimen.  Follow-up with me again in 6 to 8 weeks otherwise.  Patient could be a candidate for viscosupplementation.  Has had them previously and has responded to that and may need to consider it again.  Patient is in agreement with the plan.  Wants to avoid any surgical intervention and hopeful that this will help.  Could be a candidate for PRP as well if necessary.

## 2024-05-13 ENCOUNTER — Other Ambulatory Visit

## 2024-05-23 ENCOUNTER — Other Ambulatory Visit: Payer: Self-pay | Admitting: Family Medicine

## 2024-05-23 DIAGNOSIS — J302 Other seasonal allergic rhinitis: Secondary | ICD-10-CM

## 2024-05-23 DIAGNOSIS — F411 Generalized anxiety disorder: Secondary | ICD-10-CM

## 2024-05-23 DIAGNOSIS — F325 Major depressive disorder, single episode, in full remission: Secondary | ICD-10-CM

## 2024-06-09 ENCOUNTER — Other Ambulatory Visit: Payer: Self-pay | Admitting: Hematology and Oncology

## 2024-06-14 ENCOUNTER — Other Ambulatory Visit

## 2024-06-14 DIAGNOSIS — E039 Hypothyroidism, unspecified: Secondary | ICD-10-CM

## 2024-06-15 ENCOUNTER — Ambulatory Visit: Payer: Self-pay | Admitting: Family Medicine

## 2024-06-15 LAB — TSH: TSH: 0.612 u[IU]/mL (ref 0.450–4.500)

## 2024-06-16 NOTE — Progress Notes (Unsigned)
 No chief complaint on file.  Patient presents to f/u on recent thyroid  labs.  DEXA 02/2024 showed that osteopenia at the hip is stable); spine is a little worse than last time, but still mild thinning. Recommend adjust of thyroid  dose, to keep TSH over 1.  5/2 had mentioned that she thinks she recently picked up 90d supply. So, rather than change the dose of  medication, we adjusted her dose so that she took 75 mcg full tablet Monday through Saturday, and 1/2 tablet on Sundays.   There were messages stating she was out of meds 6/4-6/9 (didn't recall picking up 90d supply recently)--she was in the process of moving out for a renovation at home, things were chaotic.  She reports taking meds appropriately, full pill 6 days/week, 1/2 pill once a week.     Component Ref Range & Units (hover) 2 d ago 2 mo ago 8 mo ago 1 yr ago 2 yr ago 3 yr ago 7 yr ago  TSH 0.612 0.784 0.875 0.750 0.869 1.31 R 1.23 R, CM      PMH, PSH, SH reviewed   ROS:    PHYSICAL EXAM:  LMP 11/19/2007   Wt Readings from Last 3 Encounters:  05/11/24 153 lb (69.4 kg)  03/22/24 152 lb 12.8 oz (69.3 kg)  03/01/24 158 lb (71.7 kg)       ASSESSMENT/PLAN:   Is she planning to still see Dr. Tommas?  Not sure how we ended up doing this, rather than her discussing with her endo  Is she taking anything with Biotin, and if so, did she stop it prior to labs (and for how long?)  Taking full ill 6d/week and 1/2 once a week? Messages reviewed--she specifically said she picked up 90d recently, which is why we kept dose the same. Then she was requesting refill, was out of meds for a few days, bc pharmacy wouldn't fill bc she had recently gotten 90d (which she then didn't recall getting!)

## 2024-06-17 ENCOUNTER — Encounter: Payer: Self-pay | Admitting: Family Medicine

## 2024-06-17 ENCOUNTER — Ambulatory Visit: Admitting: Family Medicine

## 2024-06-17 VITALS — BP 120/70 | HR 80 | Ht 66.0 in | Wt 152.0 lb

## 2024-06-17 DIAGNOSIS — M85852 Other specified disorders of bone density and structure, left thigh: Secondary | ICD-10-CM | POA: Diagnosis not present

## 2024-06-17 DIAGNOSIS — E042 Nontoxic multinodular goiter: Secondary | ICD-10-CM

## 2024-06-17 DIAGNOSIS — Z5181 Encounter for therapeutic drug level monitoring: Secondary | ICD-10-CM

## 2024-06-17 DIAGNOSIS — F411 Generalized anxiety disorder: Secondary | ICD-10-CM

## 2024-06-17 NOTE — Assessment & Plan Note (Signed)
 Has been on suppressive treatment for nodules.  She is hesitant to decrease her dose. Discussed decreasing dose and monitoring with US . She declines--causes too much anxiety. Prefers to not let TSH go over 1. Willing to decrease the dose some--decrease to 75mcg 6 day/week, none on the 7th.

## 2024-06-17 NOTE — Assessment & Plan Note (Signed)
 Discussed risks of over-replaced thyroid  on her bones. She doesn't want TSH over 1. Understands my reasons/thoughts. Continue Ca, D, weight-bearing exercise

## 2024-06-17 NOTE — Patient Instructions (Signed)
 We discussed the thyroid  function and risk to your bones. We are lowering the dose just slightly (per your request). Take the 75 mcg pill once daily SIX days/week, and skip the 7th.  Pay attention to the manufacturer/supplier of the generic levothyroxine . The pharmacy is supposed to let you know if it changes.  This can cause significant changes in your medication/dose/thyroid  testing if it from a different supplier, which might require more frequent testing.

## 2024-06-17 NOTE — Assessment & Plan Note (Signed)
 She reports doing well on current regimen. Doesn't think she needs to return for 6 month f/u in October, only wants to come once a year.

## 2024-06-26 ENCOUNTER — Other Ambulatory Visit: Payer: Self-pay | Admitting: Family Medicine

## 2024-07-15 NOTE — Progress Notes (Signed)
 [name] [address] [place] [address] Phone: [phone] Subjective:   Rachel Juarez, am serving as a scribe for Dr. Arthea Claudene.  I'm seeing this patient by the request  of:  Randol Dawes, MD  CC: Bilateral knee pain  YEP:[id number]  05/11/2024 Chronic problem with exacerbation again.  Discussed icing regimen and home exercises, which activities to do and which ones to avoid.  Increase activity slowly.  Discussed icing regimen.  Follow-up with me again in 6 to 8 weeks otherwise.  Patient could be a candidate for viscosupplementation.  Has had them previously and has responded to that and may need to consider it again.  Patient is in agreement with the plan.  Wants to avoid any surgical intervention and hopeful that this will help.  Could be a candidate for PRP as well if necessary.     Updated 07/20/2024 Rachel Juarez is a 68 y.o. female coming in with complaint of B knee pain. Patient states that she is doing well. Has been doing yoga and gardening with less pain.   Also c/o R shoulder pain. December 2024-scope. Still has limited end range flexion and some pain with palpating anterior shoulder while arm is in IR.        Past Medical History:  Diagnosis Date   Allergy    Anxiety    Depression, major, in remission (HCC) 2006   when daughter dx'd with autoimmune disorder   Family history of breast cancer    Family history of cancer tonsil    Family history of thyroid  cancer    Generalized anxiety disorder    generalized, and related to mammograms and flying   Glaucoma suspect    sees Dr. Octavia, has all been okay, sees him qoyr   Herpes labialis    Hypercholesterolemia    high HDL, borderline LDL   Hypothyroidism    treated by Dr. Tommas   Multinodular goiter    followed by Dr. Tommas; u/s q2 yr   PPD positive 1988   treated x 6 mos of INH   Seasonal allergies    takes singulair  year-round; worse in the Fall   Past  Surgical History:  Procedure Laterality Date   CATARACT EXTRACTION, BILATERAL Bilateral June/July 2021   Dr. Ruth   KNEE ARTHROSCOPY Right 02/21/2022   for torn medial meniscus (Dr. Sheril)   SHOULDER ARTHROSCOPY Right 11/11/2023   Dr. Dozier   Social History   Socioeconomic History   Marital status: Married    Spouse name: Not on file   Number of children: Not on file   Years of education: Not on file   Highest education level: Not on file  Occupational History   Occupation: yoga teacher  Tobacco Use   Smoking status: Never   Smokeless tobacco: Never  Vaping Use   Vaping status: Never Used  Substance and Sexual Activity   Alcohol use: Yes    Comment: 2.5 drinks/week   Drug use: No   Sexual activity: Yes    Partners: Male    Birth control/protection: Post-menopausal  Other Topics Concern   Not on file  Social History Narrative   Pre-COVID, taught yoga at multiple places.    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, corporate  Volunteers at the [employer]      Updated 02/2024   Social Drivers of Health   Financial Resource Strain: Patient Declined (03/22/2024)   Overall Financial Resource Strain (CARDIA)    Difficulty of Paying Living Expenses: Patient declined  Food Insecurity: Patient Declined (03/22/2024)   Hunger Vital Sign    Worried About Running Out of Food in the Last Year: Patient declined    Ran Out of Food in the Last Year: Patient declined  Transportation Needs: Patient Declined (03/22/2024)   PRAPARE - Administrator, Civil Service (Medical): Patient declined    Lack of Transportation (Non-Medical): Patient declined  Physical Activity: Patient Declined (03/22/2024)   Exercise Vital Sign    Days of Exercise per Week: Patient declined     Minutes of Exercise per Session: Patient declined  Stress: Patient Declined (03/22/2024)   [employer] of Occupational Health - Occupational Stress Questionnaire    Feeling of Stress : Patient declined  Social Connections: Patient Declined (03/22/2024)   Social Connection and Isolation Panel    Frequency of Communication with Friends and Family: Patient declined    Frequency of Social Gatherings with Friends and Family: Patient declined    Attends Religious Services: Patient declined    Database administrator or Organizations: Patient declined    Attends Engineer, structural: Patient declined    Marital Status: Patient declined   Allergies  Allergen Reactions   Demerol [Meperidine] Other (See Comments)    Low bp, passes out   Family History  Problem Relation Age of Onset   Lupus Mother    Cancer Father 54       thymic cancer   COPD Father    Heart disease Father        CHF   Asthma Father    Cancer Sister    Breast cancer Sister 51       best kind, lumpectomy and radiation   Colon cancer Maternal Grandmother        80's   Cancer Maternal Grandmother    Hypertension Paternal Grandmother    Hyperlipidemia Paternal Grandmother    Thyroid  disease Paternal Grandmother        had goiter removed   Stroke Paternal Grandmother 22   Hypertension Paternal Grandfather    Hyperlipidemia Paternal Grandfather    Heart disease Paternal Grandfather        MI first in his 72's   Stroke Paternal Grandfather    Dermatomyositis Daughter 12       juvenile form   Irritable bowel syndrome Daughter    Breast cancer Paternal Aunt 52   Cancer Paternal Uncle        started on tonsils (smoker)   Breast cancer Cousin 74   Breast cancer Cousin 32       (the mother of the 28 y/o cousin with br ca   Thyroid  cancer Cousin    Diabetes Neg Hx    Ovarian cancer Neg Hx     Current Outpatient Medications (Endocrine & Metabolic):    levothyroxine  (SYNTHROID ) 75 MCG tablet, TAKE 1  TABLET BY MOUTH DAILY BEFORE BREAKFAST  Current Outpatient Medications (Cardiovascular):    rosuvastatin  (CRESTOR ) 10 MG tablet, Take 1 tablet (10 mg total) by mouth daily.  Current Outpatient Medications (Respiratory):    cetirizine (ZYRTEC) 10 MG tablet, Take 10 mg by mouth daily.   fluticasone  (FLONASE ) 50 MCG/ACT nasal spray, Place 2 sprays into both nostrils daily.   montelukast  (  SINGULAIR ) 10 MG tablet, TAKE 1 TABLET BY MOUTH EVERY NIGHT AT BEDTIME    Current Outpatient Medications (Other):    ALPRAZolam  (XANAX ) 0.5 MG tablet, Take 0.5-1 tablets (0.25-0.5 mg total) by mouth 3 (three) times daily as needed for anxiety or sleep.   busPIRone  (BUSPAR ) 15 MG tablet, TAKE 1 TABLET BY MOUTH 2 TIMES A DAY   Calcium  250 MG CAPS, Take 1-2 capsules by mouth daily.   cholecalciferol (VITAMIN D3) 25 MCG (1000 UNIT) tablet, Take 1,000 Units by mouth daily.   Magnesium 500 MG TABS, Take 1 tablet by mouth daily.   MILK THISTLE PO, Take 1 capsule by mouth daily.   Misc Natural Products (TART CHERRY ADVANCED PO), Take 1 capsule by mouth daily.   mupirocin  ointment (BACTROBAN ) 2 %, Apply 1 application topically 2 (two) times daily.   NON FORMULARY, Take 2 capsules by mouth daily.   NON FORMULARY, Take 2 capsules by mouth daily.   Omega-3 Fatty Acids (FISH OIL) 1000 MG CPDR, Take 2,000 mg by mouth daily.   tamoxifen  (NOLVADEX ) 10 MG tablet, TAKE 1/2 TABLET BY MOUTH DAILY   valACYclovir  (VALTREX ) 1000 MG tablet, TAKE 2 TABLETS AT ONSET OF COLD SORE. REPEAT ONCE IN 12 HOURS (4 PILLS/COURSE)   venlafaxine  XR (EFFEXOR -XR) 150 MG 24 hr capsule, TAKE 1 CAPSULE BY MOUTH DAILY WITH BREAKFAST   vitamin C (ASCORBIC ACID) 500 MG tablet, Take 500 mg by mouth daily.   Reviewed prior external information including notes and imaging from  primary care provider As well as notes that were available from care everywhere and other healthcare systems.  Past medical history, social, surgical and family history all  reviewed in electronic medical record.  No pertanent information unless stated regarding to the chief complaint.   Review of Systems:  No headache, visual changes, nausea, vomiting, diarrhea, constipation, dizziness, abdominal pain, skin rash, fevers, chills, night sweats, weight loss, swollen lymph nodes, body aches, joint swelling, chest pain, shortness of breath, mood changes. POSITIVE muscle aches  Objective  Blood pressure 114/86, pulse (!) 104, height 5' 6 (1.676 m), weight 148 lb (67.1 kg), last menstrual period 11/19/2007, SpO2 98%.   General: No apparent distress alert and oriented x3 mood and affect normal, dressed appropriately.  HEENT: Pupils equal, extraocular movements intact  Respiratory: Patient's speak in full sentences and does not appear short of breath  Cardiovascular: No lower extremity edema, non tender, no erythema  Bilateral knee exam shows patient does have some arthritic changes noted.  Seems to be the knee but very minimal effusion noted right greater than left.    Impression and Recommendations:     The above documentation has been reviewed and is accurate and complete Rachel Cross M Maurisha Mongeau, DO

## 2024-07-20 ENCOUNTER — Ambulatory Visit (INDEPENDENT_AMBULATORY_CARE_PROVIDER_SITE_OTHER): Admitting: Family Medicine

## 2024-07-20 ENCOUNTER — Encounter: Payer: Self-pay | Admitting: Family Medicine

## 2024-07-20 VITALS — BP 114/86 | HR 104 | Ht 66.0 in | Wt 148.0 lb

## 2024-07-20 DIAGNOSIS — M170 Bilateral primary osteoarthritis of knee: Secondary | ICD-10-CM

## 2024-07-20 NOTE — Patient Instructions (Signed)
See me in 6-7 weeks 

## 2024-07-20 NOTE — Assessment & Plan Note (Signed)
 Discussed with patient but decided to hold on any other injections today.  Discussed icing regimen and home exercises, discussed which activities to do and which ones to avoid.  Increase activity slowly.  Discussed icing regimen.  Follow-up again in 6 to 8 weeks

## 2024-08-18 ENCOUNTER — Encounter: Payer: Self-pay | Admitting: Family Medicine

## 2024-08-18 ENCOUNTER — Other Ambulatory Visit

## 2024-08-18 DIAGNOSIS — Z5181 Encounter for therapeutic drug level monitoring: Secondary | ICD-10-CM

## 2024-08-18 DIAGNOSIS — E042 Nontoxic multinodular goiter: Secondary | ICD-10-CM

## 2024-08-18 NOTE — Progress Notes (Unsigned)
 [name] [address] [place] [address] Phone: [phone] Subjective:   [name], am serving as a scribe for Dr. Arthea Claudene.  I'm seeing this patient by the request  of:  Randol Dawes, MD  CC: Bilateral knee pain  YEP:Dlagzrupcz  07/20/2024 Discussed with patient but decided to hold on any other injections today.  Discussed icing regimen and home exercises, discussed which activities to do and which ones to avoid.  Increase activity slowly.  Discussed icing regimen.  Follow-up again in 6 to 8 weeks     Updated 08/19/2024 Rachel Juarez is a 68 y.o. female coming in with complaint of B knee pain, having severe pain on the right knee.  Affecting daily activities and patient's walking. R definitely needs some care today. Take a look at L.       Past Medical History:  Diagnosis Date   Allergy    Anxiety    Depression, major, in remission 2006   when daughter dx'd with autoimmune disorder   Family history of breast cancer    Family history of cancer tonsil    Family history of thyroid  cancer    Generalized anxiety disorder    generalized, and related to mammograms and flying   Glaucoma suspect    sees Dr. Octavia, has all been okay, sees him qoyr   Herpes labialis    Hypercholesterolemia    high HDL, borderline LDL   Hypothyroidism    treated by Dr. Tommas   Multinodular goiter    followed by Dr. Tommas; u/s q2 yr   PPD positive 1988   treated x 6 mos of INH   Seasonal allergies    takes singulair  year-round; worse in the Fall   Past Surgical History:  Procedure Laterality Date   CATARACT EXTRACTION, BILATERAL Bilateral June/July 2021   Dr. Ruth   KNEE ARTHROSCOPY Right 02/21/2022   for torn medial meniscus (Dr. Sheril)   SHOULDER ARTHROSCOPY Right 11/11/2023   Dr. Dozier   Social History   Socioeconomic History   Marital status: Married    Spouse name: Not on file   Number of children: Not on file    Years of education: Not on file   Highest education level: Not on file  Occupational History   Occupation: yoga teacher  Tobacco Use   Smoking status: Never   Smokeless tobacco: Never  Vaping Use   Vaping status: Never Used  Substance and Sexual Activity   Alcohol use: Yes    Comment: 2.5 drinks/week   Drug use: No   Sexual activity: Yes    Partners: Male    Birth control/protection: Post-menopausal  Other Topics Concern   Not on file  Social History Narrative   Pre-COVID, taught yoga at multiple places.    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, corporate    Volunteers at the [employer]      Updated 02/2024   Social Drivers of Health   Financial Resource Strain: Patient Declined (03/22/2024)   Overall Financial Resource Strain (CARDIA)    Difficulty of Paying Living Expenses: Patient declined  Food Insecurity: Patient Declined (03/22/2024)   Hunger Vital Sign    Worried About Running Out of Food in the Last Year: Patient declined  Ran Out of Food in the Last Year: Patient declined  Transportation Needs: Patient Declined (03/22/2024)   PRAPARE - Administrator, Civil Service (Medical): Patient declined    Lack of Transportation (Non-Medical): Patient declined  Physical Activity: Patient Declined (03/22/2024)   Exercise Vital Sign    Days of Exercise per Week: Patient declined    Minutes of Exercise per Session: Patient declined  Stress: Patient Declined (03/22/2024)   [employer] of Occupational Health - Occupational Stress Questionnaire    Feeling of Stress : Patient declined  Social Connections: Patient Declined (03/22/2024)   Social Connection and Isolation Panel    Frequency of Communication with Friends and Family: Patient declined    Frequency of  Social Gatherings with Friends and Family: Patient declined    Attends Religious Services: Patient declined    Database administrator or Organizations: Patient declined    Attends Engineer, structural: Patient declined    Marital Status: Patient declined   Allergies  Allergen Reactions   Demerol [Meperidine] Other (See Comments)    Low bp, passes out   Family History  Problem Relation Age of Onset   Lupus Mother    Cancer Father 28       thymic cancer   COPD Father    Heart disease Father        CHF   Asthma Father    Cancer Sister    Breast cancer Sister 43       best kind, lumpectomy and radiation   Colon cancer Maternal Grandmother        80's   Cancer Maternal Grandmother    Hypertension Paternal Grandmother    Hyperlipidemia Paternal Grandmother    Thyroid  disease Paternal Grandmother        had goiter removed   Stroke Paternal Grandmother 60   Hypertension Paternal Grandfather    Hyperlipidemia Paternal Grandfather    Heart disease Paternal Grandfather        MI first in his 49's   Stroke Paternal Grandfather    Dermatomyositis Daughter 32       juvenile form   Irritable bowel syndrome Daughter    Breast cancer Paternal Aunt 28   Cancer Paternal Uncle        started on tonsils (smoker)   Breast cancer Cousin 12   Breast cancer Cousin 74       (the mother of the 39 y/o cousin with br ca   Thyroid  cancer Cousin    Diabetes Neg Hx    Ovarian cancer Neg Hx     Current Outpatient Medications (Endocrine & Metabolic):    levothyroxine  (SYNTHROID ) 75 MCG tablet, TAKE 1 TABLET BY MOUTH DAILY BEFORE BREAKFAST  Current Outpatient Medications (Cardiovascular):    rosuvastatin  (CRESTOR ) 10 MG tablet, Take 1 tablet (10 mg total) by mouth daily.  Current Outpatient Medications (Respiratory):    cetirizine (ZYRTEC) 10 MG tablet, Take 10 mg by mouth daily.   fluticasone  (FLONASE ) 50 MCG/ACT nasal spray, Place 2 sprays into both nostrils daily.   montelukast   (SINGULAIR ) 10 MG tablet, TAKE 1 TABLET BY MOUTH EVERY NIGHT AT BEDTIME    Current Outpatient Medications (Other):    ALPRAZolam  (XANAX ) 0.5 MG tablet, Take 0.5-1 tablets (0.25-0.5 mg total) by mouth 3 (three) times daily as needed for anxiety or sleep.   busPIRone  (BUSPAR ) 15 MG tablet, TAKE 1 TABLET BY MOUTH 2 TIMES A DAY   Calcium  250 MG CAPS, Take 1-2 capsules by  mouth daily.   cholecalciferol (VITAMIN D3) 25 MCG (1000 UNIT) tablet, Take 1,000 Units by mouth daily.   Magnesium 500 MG TABS, Take 1 tablet by mouth daily.   MILK THISTLE PO, Take 1 capsule by mouth daily.   Misc Natural Products (TART CHERRY ADVANCED PO), Take 1 capsule by mouth daily.   mupirocin  ointment (BACTROBAN ) 2 %, Apply 1 application topically 2 (two) times daily.   NON FORMULARY, Take 2 capsules by mouth daily.   NON FORMULARY, Take 2 capsules by mouth daily.   Omega-3 Fatty Acids (FISH OIL) 1000 MG CPDR, Take 2,000 mg by mouth daily.   tamoxifen  (NOLVADEX ) 10 MG tablet, TAKE 1/2 TABLET BY MOUTH DAILY   valACYclovir  (VALTREX ) 1000 MG tablet, TAKE 2 TABLETS AT ONSET OF COLD SORE. REPEAT ONCE IN 12 HOURS (4 PILLS/COURSE)   venlafaxine  XR (EFFEXOR -XR) 150 MG 24 hr capsule, TAKE 1 CAPSULE BY MOUTH DAILY WITH BREAKFAST   vitamin C (ASCORBIC ACID) 500 MG tablet, Take 500 mg by mouth daily.   Reviewed prior external information including notes and imaging from  primary care provider As well as notes that were available from care everywhere and other healthcare systems.  Past medical history, social, surgical and family history all reviewed in electronic medical record.  No pertanent information unless stated regarding to the chief complaint.   Review of Systems:  No headache, visual changes, nausea, vomiting, diarrhea, constipation, dizziness, abdominal pain, skin rash, fevers, chills, night sweats, weight loss, swollen lymph nodes, body aches, joint swelling, chest pain, shortness of breath, mood changes. POSITIVE  muscle aches  Objective  Blood pressure 124/76, pulse (!) 104, height 5' 6 (1.676 m), weight 151 lb (68.5 kg), last menstrual period 11/19/2007, SpO2 95%.   General: No apparent distress alert and oriented x3 mood and affect normal, dressed appropriately.  HEENT: Pupils equal, extraocular movements intact  Respiratory: Patient's speak in full sentences and does not appear short of breath  Cardiovascular: No lower extremity edema, non tender, no erythema  Antalgic gait noted.  Tender to palpation over the right knee.  Lateral tracking of the patella noted.  After informed written and verbal consent, patient was seated on exam table. Right knee was prepped with alcohol swab and utilizing anterolateral approach, patient's right knee space was injected with 4:1  marcaine 0.5%: Kenalog  40mg /dL. Patient tolerated the procedure well without immediate complications.    Impression and Recommendations:     The above documentation has been reviewed and is accurate and complete Rachel Persky M Ardra Kuznicki, DO

## 2024-08-19 ENCOUNTER — Encounter: Payer: Self-pay | Admitting: Family Medicine

## 2024-08-19 ENCOUNTER — Ambulatory Visit: Payer: Self-pay | Admitting: Family Medicine

## 2024-08-19 ENCOUNTER — Ambulatory Visit (INDEPENDENT_AMBULATORY_CARE_PROVIDER_SITE_OTHER): Admitting: Family Medicine

## 2024-08-19 VITALS — BP 124/76 | HR 104 | Ht 66.0 in | Wt 151.0 lb

## 2024-08-19 DIAGNOSIS — M170 Bilateral primary osteoarthritis of knee: Secondary | ICD-10-CM

## 2024-08-19 DIAGNOSIS — M25561 Pain in right knee: Secondary | ICD-10-CM | POA: Diagnosis not present

## 2024-08-19 DIAGNOSIS — G8929 Other chronic pain: Secondary | ICD-10-CM | POA: Diagnosis not present

## 2024-08-19 LAB — TSH: TSH: 1.19 u[IU]/mL (ref 0.450–4.500)

## 2024-08-19 NOTE — Assessment & Plan Note (Signed)
 Patient given injection and tolerated the procedure well, discussed icing regimen and home exercises.  Increase activity slowly.  Contralateral knee held on an injection at this time but known arthritic changes as well.  Follow-up again in 2 to 3 months

## 2024-08-19 NOTE — Patient Instructions (Addendum)
 Injection in knee today Good to see you! Keep appointment in 2 weeks

## 2024-08-30 NOTE — Progress Notes (Unsigned)
 [name] [address] [place] [address] Phone: [phone] Subjective:   Rachel Juarez, am serving as a scribe for Dr. Arthea Claudene.  I'm seeing this patient by the request  of:  Randol Dawes, MD  CC: Bilateral knee pain  YEP:Dlagzrupcz  07/20/2024 Discussed with patient but decided to hold on any other injections today.  Discussed icing regimen and home exercises, discussed which activities to do and which ones to avoid.  Increase activity slowly.  Discussed icing regimen.  Follow-up again in 6 to 8 weeks     Updated 08/31/2024 Rachel Juarez is a 68 y.o. female coming in with complaint of B knee pain, given an injection in the right knee a week ago. Having pain in thoracic spine in R scapula and ribs. Pain occurring when arms are out in front of her.        Past Medical History:  Diagnosis Date   Allergy    Anxiety    Depression, major, in remission 2006   when daughter dx'd with autoimmune disorder   Family history of breast cancer    Family history of cancer tonsil    Family history of thyroid  cancer    Generalized anxiety disorder    generalized, and related to mammograms and flying   Glaucoma suspect    sees Dr. Octavia, has all been okay, sees him qoyr   Herpes labialis    Hypercholesterolemia    high HDL, borderline LDL   Hypothyroidism    treated by Dr. Tommas   Multinodular goiter    followed by Dr. Tommas; u/s q2 yr   PPD positive 1988   treated x 6 mos of INH   Seasonal allergies    takes singulair  year-round; worse in the Fall   Past Surgical History:  Procedure Laterality Date   CATARACT EXTRACTION, BILATERAL Bilateral June/July 2021   Dr. Ruth   KNEE ARTHROSCOPY Right 02/21/2022   for torn medial meniscus (Dr. Sheril)   SHOULDER ARTHROSCOPY Right 11/11/2023   Dr. Dozier   Social History   Socioeconomic History   Marital status: Married    Spouse name: Not on file   Number of children: Not on  file   Years of education: Not on file   Highest education level: Not on file  Occupational History   Occupation: yoga teacher  Tobacco Use   Smoking status: Never   Smokeless tobacco: Never  Vaping Use   Vaping status: Never Used  Substance and Sexual Activity   Alcohol use: Yes    Comment: 2.5 drinks/week   Drug use: No   Sexual activity: Yes    Partners: Male    Birth control/protection: Post-menopausal  Other Topics Concern   Not on file  Social History Narrative   Pre-COVID, taught yoga at multiple places.    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, corporate    Volunteers at the [employer]      Updated 02/2024   Social Drivers of Health   Financial Resource Strain: Patient Declined (03/22/2024)   Overall Financial Resource Strain (CARDIA)    Difficulty of Paying Living Expenses: Patient declined  Food Insecurity: Patient Declined (03/22/2024)   Hunger Vital Sign    Worried About Running Out of Food  in the Last Year: Patient declined    Ran Out of Food in the Last Year: Patient declined  Transportation Needs: Patient Declined (03/22/2024)   PRAPARE - Administrator, Civil Service (Medical): Patient declined    Lack of Transportation (Non-Medical): Patient declined  Physical Activity: Patient Declined (03/22/2024)   Exercise Vital Sign    Days of Exercise per Week: Patient declined    Minutes of Exercise per Session: Patient declined  Stress: Patient Declined (03/22/2024)   [employer] of Occupational Health - Occupational Stress Questionnaire    Feeling of Stress : Patient declined  Social Connections: Patient Declined (03/22/2024)   Social Connection and Isolation Panel    Frequency of Communication with Friends and Family: Patient declined     Frequency of Social Gatherings with Friends and Family: Patient declined    Attends Religious Services: Patient declined    Database administrator or Organizations: Patient declined    Attends Engineer, structural: Patient declined    Marital Status: Patient declined   Allergies  Allergen Reactions   Demerol [Meperidine] Other (See Comments)    Low bp, passes out   Family History  Problem Relation Age of Onset   Lupus Mother    Cancer Father 47       thymic cancer   COPD Father    Heart disease Father        CHF   Asthma Father    Cancer Sister    Breast cancer Sister 79       best kind, lumpectomy and radiation   Colon cancer Maternal Grandmother        80's   Cancer Maternal Grandmother    Hypertension Paternal Grandmother    Hyperlipidemia Paternal Grandmother    Thyroid  disease Paternal Grandmother        had goiter removed   Stroke Paternal Grandmother 69   Hypertension Paternal Grandfather    Hyperlipidemia Paternal Grandfather    Heart disease Paternal Grandfather        MI first in his 27's   Stroke Paternal Grandfather    Dermatomyositis Daughter 55       juvenile form   Irritable bowel syndrome Daughter    Breast cancer Paternal Aunt 23   Cancer Paternal Uncle        started on tonsils (smoker)   Breast cancer Cousin 10   Breast cancer Cousin 50       (the mother of the 14 y/o cousin with br ca   Thyroid  cancer Cousin    Diabetes Neg Hx    Ovarian cancer Neg Hx     Current Outpatient Medications (Endocrine & Metabolic):    levothyroxine  (SYNTHROID ) 75 MCG tablet, TAKE 1 TABLET BY MOUTH DAILY BEFORE BREAKFAST  Current Outpatient Medications (Cardiovascular):    rosuvastatin  (CRESTOR ) 10 MG tablet, Take 1 tablet (10 mg total) by mouth daily.  Current Outpatient Medications (Respiratory):    cetirizine (ZYRTEC) 10 MG tablet, Take 10 mg by mouth daily.   fluticasone  (FLONASE ) 50 MCG/ACT nasal spray, Place 2 sprays into both nostrils daily.    montelukast  (SINGULAIR ) 10 MG tablet, TAKE 1 TABLET BY MOUTH EVERY NIGHT AT BEDTIME    Current Outpatient Medications (Other):    ALPRAZolam  (XANAX ) 0.5 MG tablet, Take 0.5-1 tablets (0.25-0.5 mg total) by mouth 3 (three) times daily as needed for anxiety or sleep.   busPIRone  (BUSPAR ) 15 MG tablet, TAKE 1 TABLET BY MOUTH 2 TIMES A DAY  Calcium  250 MG CAPS, Take 1-2 capsules by mouth daily.   cholecalciferol (VITAMIN D3) 25 MCG (1000 UNIT) tablet, Take 1,000 Units by mouth daily.   Magnesium 500 MG TABS, Take 1 tablet by mouth daily.   MILK THISTLE PO, Take 1 capsule by mouth daily.   Misc Natural Products (TART CHERRY ADVANCED PO), Take 1 capsule by mouth daily.   mupirocin  ointment (BACTROBAN ) 2 %, Apply 1 application topically 2 (two) times daily.   NON FORMULARY, Take 2 capsules by mouth daily.   NON FORMULARY, Take 2 capsules by mouth daily.   Omega-3 Fatty Acids (FISH OIL) 1000 MG CPDR, Take 2,000 mg by mouth daily.   tamoxifen  (NOLVADEX ) 10 MG tablet, TAKE 1/2 TABLET BY MOUTH DAILY   valACYclovir  (VALTREX ) 1000 MG tablet, TAKE 2 TABLETS AT ONSET OF COLD SORE. REPEAT ONCE IN 12 HOURS (4 PILLS/COURSE)   venlafaxine  XR (EFFEXOR -XR) 150 MG 24 hr capsule, TAKE 1 CAPSULE BY MOUTH DAILY WITH BREAKFAST   vitamin C (ASCORBIC ACID) 500 MG tablet, Take 500 mg by mouth daily.   Reviewed prior external information including notes and imaging from  primary care provider As well as notes that were available from care everywhere and other healthcare systems.  Past medical history, social, surgical and family history all reviewed in electronic medical record.  No pertanent information unless stated regarding to the chief complaint.   Review of Systems:  No headache, visual changes, nausea, vomiting, diarrhea, constipation, dizziness, abdominal pain, skin rash, fevers, chills, night sweats, weight loss, swollen lymph nodes, body aches, joint swelling, chest pain, shortness of breath, mood changes.  POSITIVE muscle aches  Objective  Blood pressure 110/82, pulse 92, height 5' 6 (1.676 m), last menstrual period 11/19/2007, SpO2 98%.   General: No apparent distress alert and oriented x3 mood and affect normal, dressed appropriately.  HEENT: Pupils equal, extraocular movements intact  Respiratory: Patient's speak in full sentences and does not appear short of breath  Cardiovascular: No lower extremity edema, non tender, no erythema   Right shoulder exam shows the patient does have some tenderness to palpation more in the periscapular area.  Multiple trigger points noted.  This is in the scapula, trapezius and rhomboid muscle.  Other muscle is latissimus dorsi.  After verbal consent patient was prepped with alcohol swab and with a 25-gauge half inch needle injected in 4 distinct trigger points in the rhomboid, trapezius and latissimus dorsi.  Total of 3 cc of 0.5% Marcaine and 1 cc of Kenalog  40 mg/mL used.  Minimal blood loss.  Band-Aid placed.  Postinjection instruction given   Impression and Recommendations:    The above documentation has been reviewed and is accurate and complete Trude Cansler M Piper Albro, DO

## 2024-08-31 ENCOUNTER — Encounter: Payer: Self-pay | Admitting: Family Medicine

## 2024-08-31 ENCOUNTER — Ambulatory Visit: Admitting: Family Medicine

## 2024-08-31 VITALS — BP 110/82 | HR 92 | Ht 66.0 in

## 2024-08-31 DIAGNOSIS — M25511 Pain in right shoulder: Secondary | ICD-10-CM

## 2024-08-31 DIAGNOSIS — B001 Herpesviral vesicular dermatitis: Secondary | ICD-10-CM

## 2024-08-31 DIAGNOSIS — M170 Bilateral primary osteoarthritis of knee: Secondary | ICD-10-CM

## 2024-08-31 DIAGNOSIS — F419 Anxiety disorder, unspecified: Secondary | ICD-10-CM

## 2024-08-31 MED ORDER — ALPRAZOLAM 0.5 MG PO TABS: 0.2500 mg | ORAL_TABLET | Freq: Three times a day (TID) | ORAL | 0 refills | Status: AC | PRN

## 2024-08-31 MED ORDER — VALACYCLOVIR HCL 1 G PO TABS: ORAL_TABLET | ORAL | 0 refills | Status: AC

## 2024-08-31 NOTE — Assessment & Plan Note (Signed)
 Patient given injection and tolerated the procedure well, discussed icing regimen and home exercises, discussed which activities to do and which ones to avoid.  Increase activity slowly.  Discussed icing regimen.  Follow-up again in 6 to 8 weeks

## 2024-08-31 NOTE — Assessment & Plan Note (Signed)
 Stable at the moment.  No need to do any other injection at this time

## 2024-08-31 NOTE — Patient Instructions (Signed)
 See me in 6 weeks

## 2024-09-01 ENCOUNTER — Ambulatory Visit
Admission: RE | Admit: 2024-09-01 | Discharge: 2024-09-01 | Disposition: A | Source: Ambulatory Visit | Attending: Hematology and Oncology

## 2024-09-01 DIAGNOSIS — Z9189 Other specified personal risk factors, not elsewhere classified: Secondary | ICD-10-CM

## 2024-09-01 MED ORDER — GADOPICLENOL 0.5 MMOL/ML IV SOLN
7.0000 mL | Freq: Once | INTRAVENOUS | Status: AC | PRN
  Administered 2024-09-01: 7 mL via INTRAVENOUS

## 2024-09-10 ENCOUNTER — Encounter: Payer: Self-pay | Admitting: Family Medicine

## 2024-09-10 ENCOUNTER — Ambulatory Visit: Admitting: Internal Medicine

## 2024-09-10 ENCOUNTER — Ambulatory Visit (INDEPENDENT_AMBULATORY_CARE_PROVIDER_SITE_OTHER): Admitting: Medical

## 2024-09-10 VITALS — BP 120/70 | HR 78 | Temp 97.9°F | Wt 151.0 lb

## 2024-09-10 DIAGNOSIS — M7989 Other specified soft tissue disorders: Secondary | ICD-10-CM

## 2024-09-10 DIAGNOSIS — Z91038 Other insect allergy status: Secondary | ICD-10-CM | POA: Diagnosis not present

## 2024-09-10 DIAGNOSIS — T63461A Toxic effect of venom of wasps, accidental (unintentional), initial encounter: Secondary | ICD-10-CM | POA: Diagnosis not present

## 2024-09-10 MED ORDER — HYDROXYZINE HCL 10 MG PO TABS: 10.0000 mg | ORAL_TABLET | Freq: Three times a day (TID) | ORAL | 0 refills | Status: AC | PRN

## 2024-09-10 MED ORDER — TRIAMCINOLONE ACETONIDE 0.1 % EX CREA: 1.0000 | TOPICAL_CREAM | Freq: Two times a day (BID) | CUTANEOUS | 0 refills | Status: AC

## 2024-09-10 MED ORDER — PREDNISONE 10 MG PO TABS: ORAL_TABLET | ORAL | 0 refills | Status: AC

## 2024-09-10 NOTE — Progress Notes (Signed)
 Subjective:  Rachel Juarez is a 68 y.o. female who presents for Chief Complaint  Patient presents with   Acute Visit    Bee sting, stung on hand. Itchy, hot to touch. Never saw the bee. Feels itchy in other areas but doesn't see hives yet. Hasn't taken anything   Here for bee sting.  She was trimming some bushes yesterday and reached in with a gloved hand and felt the sting.  She did not actually see the bee but felt the sting.  She has had swelling and discomfort and itching of the right hand and seem to be moving up the arm and getting worse.  No fever.  No body aches and chills otherwise.  Has used some cold pack topically.  She has had significant allergies to poison ivy in the past.  No history of anaphylaxis.  no other aggravating or relieving factors.    No other c/o.  The following portions of the patient's history were reviewed and updated as appropriate: allergies, current medications, past family history, past medical history, past social history, past surgical history and problem list.  ROS Otherwise as in subjective above  Objective: BP 120/70   Pulse 78   Temp 97.9 F (36.6 C)   Wt 151 lb (68.5 kg)   LMP 11/19/2007   BMI 24.37 kg/m   General appearance: alert, no distress, well developed, well nourished Dorsal right hand with some mild puffy swelling and pinkish coloration, mildly tender to palpation.  Pain is in hand range of motion normal.  No obvious injection or sting site.  No induration or fluctuance. Hand is neurovascularly intact Otherwise well-appearing, no dyspnea    Assessment: Encounter Diagnoses  Name Primary?   Wasp sting, accidental or unintentional, initial encounter Yes   Hand swelling    Hypersensitivity to insect stings      Plan: Advised cold therapy, elevation of hand, relative rest, can use hydroxyzine  as needed for itching and reaction but do not duplicate with her other allergy medicines that she will keep separate for nighttime  use.  Can use topical triamcinolone  cream to the area since other topical creams are not helping.  She was to go ahead and begin a round of steroid given her worsening symptoms and she is afraid is going to travel up her arm.  Symptoms should gradually improve over the next few days.  If worse or not improving, then get reevaluated.  [name] was seen today for acute visit.  Diagnoses and all orders for this visit:  Wasp sting, accidental or unintentional, initial encounter  Hand swelling  Hypersensitivity to insect stings  Other orders -     triamcinolone  cream (KENALOG ) 0.1 %; Apply 1 Application topically 2 (two) times daily. -     predniSONE  (DELTASONE ) 10 MG tablet; 6 tablets all together day 1, 5 tablets day 2, 4 tablets day 3, 3 tablets day 4, 2 tablets day 5, 1 tablet day 6. -     hydrOXYzine  (ATARAX ) 10 MG tablet; Take 1 tablet (10 mg total) by mouth 3 (three) times daily as needed.    Follow up: prn

## 2024-09-16 ENCOUNTER — Encounter: Payer: Self-pay | Admitting: Family Medicine

## 2024-09-16 NOTE — Telephone Encounter (Signed)
 Added to wait list.

## 2024-09-20 ENCOUNTER — Encounter: Admitting: Family Medicine

## 2024-09-23 ENCOUNTER — Encounter: Payer: Self-pay | Admitting: Family Medicine

## 2024-09-28 NOTE — Progress Notes (Addendum)
 [name] [address] [place] [address] Phone: [phone] Subjective:   Rachel Juarez Rachel Juarez am a scribe for Dr. Claudene.   I'm seeing this patient by the request  of:  Randol Dawes, MD  CC: Right shoulder pain  YEP:Dlagzrupcz  08/31/2024 Patient given injection and tolerated the procedure well, discussed icing regimen and home exercises, discussed which activities to do and which ones to avoid.  Increase activity slowly.  Discussed icing regimen.  Follow-up again in 6 to 8 weeks     Stable at the moment.  No need to do any other injection at this time     Updated 09/29/2024 Rachel Juarez is a 68 y.o. female coming in with complaint of knee and shoulder pain, last exam given trigger point injections instead of the shoulder itself.  Starting to have worsening shoulder pain now.  Affecting daily activities as well as nighttime pain. Patient states left shoulder hurts today. It catches sometimes. It aches consistently. Knees are doing great.  Pain has been going on for about three to four weeks. No injury but gardening a lot and yoga class right after.        Past Medical History:  Diagnosis Date   Allergy    Anxiety    Depression, major, in remission 2006   when daughter dx'd with autoimmune disorder   Family history of breast cancer    Family history of cancer tonsil    Family history of thyroid  cancer    Generalized anxiety disorder    generalized, and related to mammograms and flying   Glaucoma suspect    sees Dr. Octavia, has all been okay, sees him qoyr   Herpes labialis    Hypercholesterolemia    high HDL, borderline LDL   Hypothyroidism    treated by Dr. Tommas   Multinodular goiter    followed by Dr. Tommas; u/s q2 yr   PPD positive 1988   treated x 6 mos of INH   Seasonal allergies    takes singulair  year-round; worse in the Fall   Past Surgical History:  Procedure Laterality Date   CATARACT EXTRACTION, BILATERAL  Bilateral June/July 2021   Dr. Ruth   KNEE ARTHROSCOPY Right 02/21/2022   for torn medial meniscus (Dr. Sheril)   SHOULDER ARTHROSCOPY Right 11/11/2023   Dr. Dozier   Social History   Socioeconomic History   Marital status: Married    Spouse name: Not on file   Number of children: Not on file   Years of education: Not on file   Highest education level: Not on file  Occupational History   Occupation: yoga teacher  Tobacco Use   Smoking status: Never   Smokeless tobacco: Never  Vaping Use   Vaping status: Never Used  Substance and Sexual Activity   Alcohol use: Yes    Comment: 2.5 drinks/week   Drug use: No   Sexual activity: Yes    Partners: Male    Birth control/protection: Post-menopausal  Other Topics Concern   Not on file  Social History Narrative   Pre-COVID, taught yoga at multiple places.    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, corporate    Volunteers at the [employer]      Updated 02/2024  Social Drivers of Health   Financial Resource Strain: Patient Declined (03/22/2024)   Overall Financial Resource Strain (CARDIA)    Difficulty of Paying Living Expenses: Patient declined  Food Insecurity: Patient Declined (03/22/2024)   Hunger Vital Sign    Worried About Running Out of Food in the Last Year: Patient declined    Ran Out of Food in the Last Year: Patient declined  Transportation Needs: Patient Declined (03/22/2024)   PRAPARE - Administrator, Civil Service (Medical): Patient declined    Lack of Transportation (Non-Medical): Patient declined  Physical Activity: Patient Declined (03/22/2024)   Exercise Vital Sign    Days of Exercise per Week: Patient declined    Minutes of Exercise per Session: Patient declined  Stress: Patient Declined  (03/22/2024)   [employer] of Occupational Health - Occupational Stress Questionnaire    Feeling of Stress : Patient declined  Social Connections: Patient Declined (03/22/2024)   Social Connection and Isolation Panel    Frequency of Communication with Friends and Family: Patient declined    Frequency of Social Gatherings with Friends and Family: Patient declined    Attends Religious Services: Patient declined    Database Administrator or Organizations: Patient declined    Attends Engineer, Structural: Patient declined    Marital Status: Patient declined   Allergies  Allergen Reactions   Demerol [Meperidine] Other (See Comments)    Low bp, passes out   Family History  Problem Relation Age of Onset   Lupus Mother    Cancer Father 74       thymic cancer   COPD Father    Heart disease Father        CHF   Asthma Father    Cancer Sister    Breast cancer Sister 56       best kind, lumpectomy and radiation   Colon cancer Maternal Grandmother        80's   Cancer Maternal Grandmother    Hypertension Paternal Grandmother    Hyperlipidemia Paternal Grandmother    Thyroid  disease Paternal Grandmother        had goiter removed   Stroke Paternal Grandmother 27   Hypertension Paternal Grandfather    Hyperlipidemia Paternal Grandfather    Heart disease Paternal Grandfather        MI first in his 56's   Stroke Paternal Grandfather    Dermatomyositis Daughter 50       juvenile form   Irritable bowel syndrome Daughter    Breast cancer Paternal Aunt 95   Cancer Paternal Uncle        started on tonsils (smoker)   Breast cancer Cousin 27   Breast cancer Cousin 43       (the mother of the 57 y/o cousin with br ca   Thyroid  cancer Cousin    Diabetes Neg Hx    Ovarian cancer Neg Hx     Current Outpatient Medications (Endocrine & Metabolic):    levothyroxine  (SYNTHROID ) 75 MCG tablet, TAKE 1 TABLET BY MOUTH DAILY BEFORE BREAKFAST   predniSONE  (DELTASONE ) 10 MG tablet, 6  tablets all together day 1, 5 tablets day 2, 4 tablets day 3, 3 tablets day 4, 2 tablets day 5, 1 tablet day 6.  Current Outpatient Medications (Cardiovascular):    rosuvastatin  (CRESTOR ) 10 MG tablet, Take 1 tablet (10 mg total) by mouth daily.  Current Outpatient Medications (Respiratory):    cetirizine (ZYRTEC) 10 MG tablet, Take 10 mg by mouth  daily.   fluticasone  (FLONASE ) 50 MCG/ACT nasal spray, Place 2 sprays into both nostrils daily.   montelukast  (SINGULAIR ) 10 MG tablet, TAKE 1 TABLET BY MOUTH EVERY NIGHT AT BEDTIME    Current Outpatient Medications (Other):    ALPRAZolam  (XANAX ) 0.5 MG tablet, Take 0.5-1 tablets (0.25-0.5 mg total) by mouth 3 (three) times daily as needed for anxiety or sleep.   busPIRone  (BUSPAR ) 15 MG tablet, TAKE 1 TABLET BY MOUTH 2 TIMES A DAY   Calcium  250 MG CAPS, Take 1-2 capsules by mouth daily.   cholecalciferol (VITAMIN D3) 25 MCG (1000 UNIT) tablet, Take 1,000 Units by mouth daily.   hydrOXYzine  (ATARAX ) 10 MG tablet, Take 1 tablet (10 mg total) by mouth 3 (three) times daily as needed.   Magnesium 500 MG TABS, Take 1 tablet by mouth daily.   MILK THISTLE PO, Take 1 capsule by mouth daily.   Misc Natural Products (TART CHERRY ADVANCED PO), Take 1 capsule by mouth daily.   mupirocin  ointment (BACTROBAN ) 2 %, Apply 1 application topically 2 (two) times daily.   NON FORMULARY, Take 2 capsules by mouth daily.   NON FORMULARY, Take 2 capsules by mouth daily.   Omega-3 Fatty Acids (FISH OIL) 1000 MG CPDR, Take 2,000 mg by mouth daily.   tamoxifen  (NOLVADEX ) 10 MG tablet, TAKE 1/2 TABLET BY MOUTH DAILY   triamcinolone  cream (KENALOG ) 0.1 %, Apply 1 Application topically 2 (two) times daily.   valACYclovir  (VALTREX ) 1000 MG tablet, TAKE 2 TABLETS AT ONSET OF COLD SORE. REPEAT ONCE IN 12 HOURS (4 PILLS/COURSE)   venlafaxine  XR (EFFEXOR -XR) 150 MG 24 hr capsule, TAKE 1 CAPSULE BY MOUTH DAILY WITH BREAKFAST   vitamin C (ASCORBIC ACID) 500 MG tablet, Take 500  mg by mouth daily.   Reviewed prior external information including notes and imaging from  primary care provider As well as notes that were available from care everywhere and other healthcare systems.  Past medical history, social, surgical and family history all reviewed in electronic medical record.  No pertanent information unless stated regarding to the chief complaint.   Review of Systems:  No headache, visual changes, nausea, vomiting, diarrhea, constipation, dizziness, abdominal pain, skin rash, fevers, chills, night sweats, weight loss, swollen lymph nodes, body aches, joint swelling, chest pain, shortness of breath, mood changes. POSITIVE muscle aches  Objective  Blood pressure 130/80, pulse (!) 108, height 5' 6 (1.676 m), weight 152 lb (68.9 kg), last menstrual period 11/19/2007, SpO2 97%.   General: No apparent distress alert and oriented x3 mood and affect normal, dressed appropriately.  HEENT: Pupils equal, extraocular movements intact  Respiratory: Patient's speak in full sentences and does not appear short of breath  Cardiovascular: No lower extremity edema, non tender, no erythema  Right shoulder exam shows   Limited muscular skeletal ultrasound was performed and interpreted by CLAUDENE HUSSAR, M    Procedure: Real-time Ultrasound Guided Injection of left  acromial space  Device: GE Logiq Q7  Ultrasound guided injection is preferred based studies that show increased duration, increased effect, greater accuracy, decreased procedural pain, increased response rate with ultrasound guided versus blind injection.  Verbal informed consent obtained.  Time-out conducted.  Noted no overlying erythema, induration, or other signs of local infection.  Skin prepped in a sterile fashion.  Local anesthesia: Topical Ethyl chloride.  With sterile technique and under real time ultrasound guidance:  Joint visualized.  23g 1  inch needle inserted lateral approach. Pictures taken for  needle placement. Patient did  have injection of , 2 cc of 0.5% Marcaine, and 1.0 cc of Kenalog  40 mg/dL. Completed without difficulty  Pain immediately resolved suggesting accurate placement of the medication.  Advised to call if fevers/chills, erythema, induration, drainage, or persistent bleeding.  Impression: Technically successful ultrasound guided injection.  Procedure: Real-time Ultrasound Guided Injection of left  acromioclavicular joint Device: GE Logiq Q7 Ultrasound guided injection is preferred based studies that show increased duration, increased effect, greater accuracy, decreased procedural pain, increased response rate, and decreased cost with ultrasound guided versus blind injection.  Verbal informed consent obtained.  Time-out conducted.  Noted no overlying erythema, induration, or other signs of local infection.  Skin prepped in a sterile fashion.  Local anesthesia: Topical Ethyl chloride.  With sterile technique and under real time ultrasound guidance: With a 25-gauge half inch needle injected with 0.5 cc of 0.5% Marcaine and 0.5 cc of Kenalog  40 mg/mL Completed without difficulty  Pain immediately resolved suggesting accurate placement of the medication.  Advised to call if fevers/chills, erythema, induration, drainage, or persistent bleeding.  Images saved Impression: Technically successful ultrasound guided injection.   Impression and Recommendations:     The above documentation has been reviewed and is accurate and complete Rachel Juarez M Glory Graefe, DO

## 2024-09-29 ENCOUNTER — Other Ambulatory Visit: Payer: Self-pay

## 2024-09-29 ENCOUNTER — Ambulatory Visit: Admitting: Family Medicine

## 2024-09-29 ENCOUNTER — Encounter: Payer: Self-pay | Admitting: Family Medicine

## 2024-09-29 VITALS — BP 130/80 | HR 108 | Ht 66.0 in | Wt 152.0 lb

## 2024-09-29 DIAGNOSIS — M7552 Bursitis of left shoulder: Secondary | ICD-10-CM

## 2024-09-29 DIAGNOSIS — M25512 Pain in left shoulder: Secondary | ICD-10-CM | POA: Diagnosis not present

## 2024-09-29 DIAGNOSIS — M25412 Effusion, left shoulder: Secondary | ICD-10-CM | POA: Diagnosis not present

## 2024-09-29 NOTE — Assessment & Plan Note (Signed)
 Patient given injection and tolerated the procedure well, discussed icing regimen and home exercises, discussed with patient about avoiding any heavy lifting for the next week.  Increase activity slowly.  Follow-up again in 6 to 12 weeks otherwise.  Likely on ultrasound do not see any true tearing of the rotator cuff so hopefully improvement will be quicker for this.

## 2024-09-29 NOTE — Assessment & Plan Note (Signed)
 Patient given injection and tolerated the procedure well.  Improvement in range of motion almost immediately.  Hopefully this will make a good difference.  Limit similar range of motion of the shoulder.  Discussed icing regimen otherwise.  Follow-up again in 6 to 8 weeks.

## 2024-09-29 NOTE — Patient Instructions (Addendum)
 Good to see you. Injected left shoulder today. Tell Maude that is a little bit optimistic.  Tell trainer 50% weight then 25% weight after 2 weeks.  See me again in 2 to 3 months.

## 2024-10-08 NOTE — Progress Notes (Deleted)
 [facility] [address] [place] [address] Phone: [phone] Subjective:    I'm seeing this patient by the request  of:  Randol Dawes, MD  CC:   YEP:Dlagzrupcz  09/29/2024 Patient given injection and tolerated the procedure well. Improvement in range of motion almost immediately. Hopefully this will make a good difference. Limit similar range of motion of the shoulder. Discussed icing regimen otherwise. Follow-up again in 6 to 8 weeks.   Update 10/11/2024 Rachel Juarez is a 68 y.o. female coming in with complaint of L AC joint. Patient states        Past Medical History:  Diagnosis Date   Allergy    Anxiety    Depression, major, in remission 2006   when daughter dx'd with autoimmune disorder   Family history of breast cancer    Family history of cancer tonsil    Family history of thyroid  cancer    Generalized anxiety disorder    generalized, and related to mammograms and flying   Glaucoma suspect    sees Dr. Octavia, has all been okay, sees him qoyr   Herpes labialis    Hypercholesterolemia    high HDL, borderline LDL   Hypothyroidism    treated by Dr. Tommas   Multinodular goiter    followed by Dr. Tommas; u/s q2 yr   PPD positive 1988   treated x 6 mos of INH   Seasonal allergies    takes singulair  year-round; worse in the Fall   Past Surgical History:  Procedure Laterality Date   CATARACT EXTRACTION, BILATERAL Bilateral June/July 2021   Dr. Ruth   KNEE ARTHROSCOPY Right 02/21/2022   for torn medial meniscus (Dr. Sheril)   SHOULDER ARTHROSCOPY Right 11/11/2023   Dr. Dozier   Social History   Socioeconomic History   Marital status: Married    Spouse name: Not on file   Number of children: Not on file   Years of education: Not on file   Highest education level: Not on file  Occupational History   Occupation: yoga teacher  Tobacco Use   Smoking status: Never   Smokeless tobacco: Never  Vaping Use   Vaping  status: Never Used  Substance and Sexual Activity   Alcohol use: Yes    Comment: 2.5 drinks/week   Drug use: No   Sexual activity: Yes    Partners: Male    Birth control/protection: Post-menopausal  Other Topics Concern   Not on file  Social History Narrative   Pre-COVID, taught yoga at multiple places.    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, corporate    Volunteers at the [employer]      Updated 02/2024   Social Drivers of Health   Financial Resource Strain: Patient Declined (03/22/2024)   Overall Financial Resource Strain (CARDIA)    Difficulty of Paying Living Expenses: Patient declined  Food Insecurity: Patient Declined (03/22/2024)   Hunger Vital Sign    Worried About Running Out of Food in the Last Year: Patient declined    Ran Out of Food in the Last Year: Patient declined  Transportation Needs: Patient Declined (03/22/2024)   PRAPARE - Administrator, Civil Service (Medical): Patient declined    Lack of Transportation (Non-Medical): Patient declined  Physical Activity: Patient Declined (03/22/2024)   Exercise Vital Sign    Days of Exercise per Week: Patient declined    Minutes of Exercise per Session: Patient declined  Stress: Patient Declined (03/22/2024)   [employer] of Occupational Health - Occupational Stress Questionnaire    Feeling of Stress : Patient declined  Social Connections: Patient Declined (03/22/2024)   Social Connection and Isolation Panel    Frequency of Communication with Friends and Family: Patient declined    Frequency of Social Gatherings with Friends and Family: Patient declined    Attends Religious Services: Patient declined    Database Administrator or Organizations: Patient declined    Attends Engineer, Structural:  Patient declined    Marital Status: Patient declined   Allergies  Allergen Reactions   Demerol [Meperidine] Other (See Comments)    Low bp, passes out   Family History  Problem Relation Age of Onset   Lupus Mother    Cancer Father 25       thymic cancer   COPD Father    Heart disease Father        CHF   Asthma Father    Cancer Sister    Breast cancer Sister 13       best kind, lumpectomy and radiation   Colon cancer Maternal Grandmother        80's   Cancer Maternal Grandmother    Hypertension Paternal Grandmother    Hyperlipidemia Paternal Grandmother    Thyroid  disease Paternal Grandmother        had goiter removed   Stroke Paternal Grandmother 6   Hypertension Paternal Grandfather    Hyperlipidemia Paternal Grandfather    Heart disease Paternal Grandfather        MI first in his 63's   Stroke Paternal Grandfather    Dermatomyositis Daughter 39       juvenile form   Irritable bowel syndrome Daughter    Breast cancer Paternal Aunt 20   Cancer Paternal Uncle        started on tonsils (smoker)   Breast cancer Cousin 47   Breast cancer Cousin 66       (the mother of the 76 y/o cousin with br ca   Thyroid  cancer Cousin    Diabetes Neg Hx    Ovarian cancer Neg Hx     Current Outpatient Medications (Endocrine & Metabolic):    levothyroxine  (SYNTHROID ) 75 MCG tablet, TAKE 1 TABLET BY MOUTH DAILY BEFORE BREAKFAST   predniSONE  (DELTASONE ) 10 MG tablet, 6 tablets all together day 1, 5 tablets day 2, 4 tablets day 3, 3 tablets day 4, 2 tablets day 5, 1 tablet day 6.  Current Outpatient Medications (Cardiovascular):    rosuvastatin  (CRESTOR ) 10 MG tablet, Take 1 tablet (10 mg total) by mouth daily.  Current Outpatient Medications (Respiratory):    cetirizine (ZYRTEC) 10 MG tablet, Take 10 mg by mouth daily.   fluticasone  (FLONASE ) 50 MCG/ACT nasal spray, Place 2 sprays into both nostrils daily.   montelukast  (SINGULAIR ) 10 MG tablet, TAKE 1 TABLET BY MOUTH EVERY NIGHT  AT BEDTIME    Current Outpatient Medications (Other):    ALPRAZolam  (XANAX ) 0.5 MG tablet, Take 0.5-1 tablets (0.25-0.5 mg total) by mouth 3 (three) times daily as needed for anxiety or sleep.   busPIRone  (BUSPAR ) 15 MG tablet, TAKE 1 TABLET BY MOUTH 2 TIMES A DAY   Calcium  250 MG CAPS, Take 1-2 capsules by mouth daily.   cholecalciferol (VITAMIN D3)  25 MCG (1000 UNIT) tablet, Take 1,000 Units by mouth daily.   hydrOXYzine  (ATARAX ) 10 MG tablet, Take 1 tablet (10 mg total) by mouth 3 (three) times daily as needed.   Magnesium 500 MG TABS, Take 1 tablet by mouth daily.   MILK THISTLE PO, Take 1 capsule by mouth daily.   Misc Natural Products (TART CHERRY ADVANCED PO), Take 1 capsule by mouth daily.   mupirocin  ointment (BACTROBAN ) 2 %, Apply 1 application topically 2 (two) times daily.   NON FORMULARY, Take 2 capsules by mouth daily.   NON FORMULARY, Take 2 capsules by mouth daily.   Omega-3 Fatty Acids (FISH OIL) 1000 MG CPDR, Take 2,000 mg by mouth daily.   tamoxifen  (NOLVADEX ) 10 MG tablet, TAKE 1/2 TABLET BY MOUTH DAILY   triamcinolone  cream (KENALOG ) 0.1 %, Apply 1 Application topically 2 (two) times daily.   valACYclovir  (VALTREX ) 1000 MG tablet, TAKE 2 TABLETS AT ONSET OF COLD SORE. REPEAT ONCE IN 12 HOURS (4 PILLS/COURSE)   venlafaxine  XR (EFFEXOR -XR) 150 MG 24 hr capsule, TAKE 1 CAPSULE BY MOUTH DAILY WITH BREAKFAST   vitamin C (ASCORBIC ACID) 500 MG tablet, Take 500 mg by mouth daily.   Reviewed prior external information including notes and imaging from  primary care provider As well as notes that were available from care everywhere and other healthcare systems.  Past medical history, social, surgical and family history all reviewed in electronic medical record.  No pertanent information unless stated regarding to the chief complaint.   Review of Systems:  No headache, visual changes, nausea, vomiting, diarrhea, constipation, dizziness, abdominal pain, skin rash, fevers,  chills, night sweats, weight loss, swollen lymph nodes, body aches, joint swelling, chest pain, shortness of breath, mood changes. POSITIVE muscle aches  Objective  Last menstrual period 11/19/2007.   General: No apparent distress alert and oriented x3 mood and affect normal, dressed appropriately.  HEENT: Pupils equal, extraocular movements intact  Respiratory: Patient's speak in full sentences and does not appear short of breath  Cardiovascular: No lower extremity edema, non tender, no erythema      Impression and Recommendations:

## 2024-10-11 NOTE — Progress Notes (Unsigned)
 [name] [address] [place] [address] Phone: [phone] Subjective:   [name], am serving as a scribe for Dr. Arthea Claudene.  I'm seeing this patient by the request  of:  Randol Dawes, MD  CC: Left shoulder pain  YEP:Dlagzrupcz  09/29/2024 Patient given injection and tolerated the procedure well.  Improvement in range of motion almost immediately.  Hopefully this will make a good difference.  Limit similar range of motion of the shoulder.  Discussed icing regimen otherwise.  Follow-up again in 6 to 8 weeks.     Patient given injection and tolerated the procedure well, discussed icing regimen and home exercises, discussed with patient about avoiding any heavy lifting for the next week.  Increase activity slowly.  Follow-up again in 6 to 12 weeks otherwise.  Likely on ultrasound do not see any true tearing of the rotator cuff so hopefully improvement will be quicker for this.     Update 10/14/2024 Rachel Juarez is a 68 y.o. female coming in with complaint of L shoulder pain.  Seen 2 weeks ago and given injections in both the right [facility] joint and subacromial space.   patient states has been having L shoulder pain. Last injection hasn't given much relief. Still having pain and clicking.       Past Medical History:  Diagnosis Date   Allergy    Anxiety    Depression, major, in remission 2006   when daughter dx'd with autoimmune disorder   Family history of breast cancer    Family history of cancer tonsil    Family history of thyroid  cancer    Generalized anxiety disorder    generalized, and related to mammograms and flying   Glaucoma suspect    sees Dr. Octavia, has all been okay, sees him qoyr   Herpes labialis    Hypercholesterolemia    high HDL, borderline LDL   Hypothyroidism    treated by Dr. Tommas   Multinodular goiter    followed by Dr. Tommas; u/s q2 yr   PPD positive 1988   treated x 6 mos of INH   Seasonal  allergies    takes singulair  year-round; worse in the Fall   Past Surgical History:  Procedure Laterality Date   CATARACT EXTRACTION, BILATERAL Bilateral June/July 2021   Dr. Ruth   KNEE ARTHROSCOPY Right 02/21/2022   for torn medial meniscus (Dr. Sheril)   SHOULDER ARTHROSCOPY Right 11/11/2023   Dr. Dozier   Social History   Socioeconomic History   Marital status: Married    Spouse name: Not on file   Number of children: Not on file   Years of education: Not on file   Highest education level: Not on file  Occupational History   Occupation: yoga teacher  Tobacco Use   Smoking status: Never   Smokeless tobacco: Never  Vaping Use   Vaping status: Never Used  Substance and Sexual Activity   Alcohol use: Yes    Comment: 2.5 drinks/week   Drug use: No   Sexual activity: Yes    Partners: Male    Birth control/protection: Post-menopausal  Other Topics Concern   Not on file  Social History Narrative   Pre-COVID, taught yoga at multiple places.    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 1 dog (Malti-poo)   Lives with her husband, and she has a daughter, Rachel Juarez--graduated from [employer], married, lives in [place], works as an Scientist, Forensic  at [employer]--changing to work for investment banker, corporate    Volunteers at the [employer]      Updated 02/2024   Social Drivers of Health   Financial Resource Strain: Patient Declined (03/22/2024)   Overall Financial Resource Strain (CARDIA)    Difficulty of Paying Living Expenses: Patient declined  Food Insecurity: Patient Declined (03/22/2024)   Hunger Vital Sign    Worried About Running Out of Food in the Last Year: Patient declined    Ran Out of Food in the Last Year: Patient declined  Transportation Needs: Patient Declined (03/22/2024)   PRAPARE - Administrator, Civil Service (Medical): Patient declined    Lack of Transportation (Non-Medical): Patient declined  Physical Activity: Patient Declined (03/22/2024)    Exercise Vital Sign    Days of Exercise per Week: Patient declined    Minutes of Exercise per Session: Patient declined  Stress: Patient Declined (03/22/2024)   [employer] of Occupational Health - Occupational Stress Questionnaire    Feeling of Stress : Patient declined  Social Connections: Patient Declined (03/22/2024)   Social Connection and Isolation Panel    Frequency of Communication with Friends and Family: Patient declined    Frequency of Social Gatherings with Friends and Family: Patient declined    Attends Religious Services: Patient declined    Database Administrator or Organizations: Patient declined    Attends Engineer, Structural: Patient declined    Marital Status: Patient declined   Allergies  Allergen Reactions   Demerol [Meperidine] Other (See Comments)    Low bp, passes out   Family History  Problem Relation Age of Onset   Lupus Mother    Cancer Father 62       thymic cancer   COPD Father    Heart disease Father        CHF   Asthma Father    Cancer Sister    Breast cancer Sister 19       best kind, lumpectomy and radiation   Colon cancer Maternal Grandmother        80's   Cancer Maternal Grandmother    Hypertension Paternal Grandmother    Hyperlipidemia Paternal Grandmother    Thyroid  disease Paternal Grandmother        had goiter removed   Stroke Paternal Grandmother 56   Hypertension Paternal Grandfather    Hyperlipidemia Paternal Grandfather    Heart disease Paternal Grandfather        MI first in his 33's   Stroke Paternal Grandfather    Dermatomyositis Daughter 6       juvenile form   Irritable bowel syndrome Daughter    Breast cancer Paternal Aunt 63   Cancer Paternal Uncle        started on tonsils (smoker)   Breast cancer Cousin 56   Breast cancer Cousin 57       (the mother of the 40 y/o cousin with br ca   Thyroid  cancer Cousin    Diabetes Neg Hx    Ovarian cancer Neg Hx     Current Outpatient Medications  (Endocrine & Metabolic):    levothyroxine  (SYNTHROID ) 75 MCG tablet, Take 1 tablet (75 mcg total) by mouth daily before breakfast. Take 6 days/week as directed by physician   predniSONE  (DELTASONE ) 10 MG tablet, 6 tablets all together day 1, 5 tablets day 2, 4 tablets day 3, 3 tablets day 4, 2 tablets day 5, 1 tablet day 6.  Current Outpatient Medications (Cardiovascular):  rosuvastatin  (CRESTOR ) 10 MG tablet, Take 1 tablet (10 mg total) by mouth daily.  Current Outpatient Medications (Respiratory):    cetirizine (ZYRTEC) 10 MG tablet, Take 10 mg by mouth daily.   fluticasone  (FLONASE ) 50 MCG/ACT nasal spray, Place 2 sprays into both nostrils daily.   montelukast  (SINGULAIR ) 10 MG tablet, TAKE 1 TABLET BY MOUTH EVERY NIGHT AT BEDTIME    Current Outpatient Medications (Other):    ALPRAZolam  (XANAX ) 0.5 MG tablet, Take 0.5-1 tablets (0.25-0.5 mg total) by mouth 3 (three) times daily as needed for anxiety or sleep.   busPIRone  (BUSPAR ) 15 MG tablet, TAKE 1 TABLET BY MOUTH 2 TIMES A DAY   Calcium  250 MG CAPS, Take 1-2 capsules by mouth daily.   cholecalciferol (VITAMIN D3) 25 MCG (1000 UNIT) tablet, Take 1,000 Units by mouth daily.   hydrOXYzine  (ATARAX ) 10 MG tablet, Take 1 tablet (10 mg total) by mouth 3 (three) times daily as needed.   Magnesium 500 MG TABS, Take 1 tablet by mouth daily.   MILK THISTLE PO, Take 1 capsule by mouth daily.   Misc Natural Products (TART CHERRY ADVANCED PO), Take 1 capsule by mouth daily.   mupirocin  ointment (BACTROBAN ) 2 %, Apply 1 application topically 2 (two) times daily.   NON FORMULARY, Take 2 capsules by mouth daily.   NON FORMULARY, Take 2 capsules by mouth daily.   Omega-3 Fatty Acids (FISH OIL) 1000 MG CPDR, Take 2,000 mg by mouth daily.   tamoxifen  (NOLVADEX ) 10 MG tablet, TAKE 1/2 TABLET BY MOUTH DAILY   triamcinolone  cream (KENALOG ) 0.1 %, Apply 1 Application topically 2 (two) times daily.   valACYclovir  (VALTREX ) 1000 MG tablet, TAKE 2 TABLETS  AT ONSET OF COLD SORE. REPEAT ONCE IN 12 HOURS (4 PILLS/COURSE)   venlafaxine  XR (EFFEXOR -XR) 150 MG 24 hr capsule, TAKE 1 CAPSULE BY MOUTH DAILY WITH BREAKFAST   vitamin C (ASCORBIC ACID) 500 MG tablet, Take 500 mg by mouth daily.   Reviewed prior external information including notes and imaging from  primary care provider As well as notes that were available from care everywhere and other healthcare systems.  Past medical history, social, surgical and family history all reviewed in electronic medical record.  No pertanent information unless stated regarding to the chief complaint.   Review of Systems:  No headache, visual changes, nausea, vomiting, diarrhea, constipation, dizziness, abdominal pain, skin rash, fevers, chills, night sweats, weight loss, swollen lymph nodes, body aches, joint swelling, chest pain, shortness of breath, mood changes. POSITIVE muscle aches  Objective  Blood pressure 112/82, pulse 92, height 5' 6 (1.676 m), weight 149 lb (67.6 kg), last menstrual period 11/19/2007, SpO2 100%.   General: No apparent distress alert and oriented x3 mood and affect normal, dressed appropriately.  HEENT: Pupils equal, extraocular movements intact  Respiratory: Patient's speak in full sentences and does not appear short of breath  Cardiovascular: No lower extremity edema, non tender, no erythema  Left shoulder exam shows patient does have positive impingement noted.  Patient does have positive O'Brien's noted.  Positive crossover still noted.  Pain with external range of motion as well.  Good strength of the rotator cuff noted overall.  Limited muscular skeletal ultrasound was performed and interpreted by CLAUDENE HUSSAR, M  Ultrasound shows that there is some potential calcific loose bodies noted in the glenohumeral joint posteriorly.  Patient still has some narrowing of the acromioclavicular joint.  Mild hypoechoic changes of the glenohumeral joint posteriorly also noted Impression:  Continued abnormality noted  of the glenohumeral joint with questionable loose bodies    Impression and Recommendations:    The above documentation has been reviewed and is accurate and complete Gratia Disla M Benjy Kana, DO

## 2024-10-12 ENCOUNTER — Ambulatory Visit: Admitting: Family Medicine

## 2024-10-13 ENCOUNTER — Encounter: Payer: Self-pay | Admitting: Family Medicine

## 2024-10-13 DIAGNOSIS — E039 Hypothyroidism, unspecified: Secondary | ICD-10-CM

## 2024-10-13 MED ORDER — LEVOTHYROXINE SODIUM 75 MCG PO TABS: 75.0000 ug | ORAL_TABLET | Freq: Every day | ORAL | 1 refills | Status: AC

## 2024-10-14 ENCOUNTER — Encounter: Payer: Self-pay | Admitting: Family Medicine

## 2024-10-14 ENCOUNTER — Ambulatory Visit (INDEPENDENT_AMBULATORY_CARE_PROVIDER_SITE_OTHER): Admitting: Family Medicine

## 2024-10-14 ENCOUNTER — Other Ambulatory Visit: Payer: Self-pay

## 2024-10-14 VITALS — BP 112/82 | HR 92 | Ht 66.0 in | Wt 149.0 lb

## 2024-10-14 DIAGNOSIS — M25512 Pain in left shoulder: Secondary | ICD-10-CM | POA: Diagnosis not present

## 2024-10-14 NOTE — Patient Instructions (Addendum)
 [facility]

## 2024-10-14 NOTE — Assessment & Plan Note (Signed)
 Patient has not responded to the injections.  Discussed which activities to do and which ones to avoid.  Patient has failed formal physical therapy, multiple injections, on ultrasound today appears that he may have some loose bodies.  Need to further evaluate for any deeper pathology that is contributing to the chronic discomfort.  No sign of any radicular symptoms.  Discussed icing regimen and home exercises.  Will await imaging then follow-up p again in 6 weeks

## 2024-10-17 ENCOUNTER — Ambulatory Visit

## 2024-10-17 DIAGNOSIS — M25512 Pain in left shoulder: Secondary | ICD-10-CM

## 2024-10-18 ENCOUNTER — Ambulatory Visit: Payer: Self-pay | Admitting: Family Medicine

## 2024-10-20 ENCOUNTER — Ambulatory Visit: Admitting: Family Medicine

## 2024-10-25 ENCOUNTER — Ambulatory Visit: Payer: Self-pay | Admitting: Family Medicine

## 2024-10-25 ENCOUNTER — Encounter: Payer: Self-pay | Admitting: Family Medicine

## 2024-10-25 ENCOUNTER — Other Ambulatory Visit: Payer: Self-pay

## 2024-10-25 VITALS — BP 124/84 | HR 74 | Ht 66.0 in | Wt 152.0 lb

## 2024-10-25 DIAGNOSIS — M25512 Pain in left shoulder: Secondary | ICD-10-CM

## 2024-10-25 DIAGNOSIS — M75112 Incomplete rotator cuff tear or rupture of left shoulder, not specified as traumatic: Secondary | ICD-10-CM

## 2024-10-25 DIAGNOSIS — G8929 Other chronic pain: Secondary | ICD-10-CM

## 2024-10-25 DIAGNOSIS — M75102 Unspecified rotator cuff tear or rupture of left shoulder, not specified as traumatic: Secondary | ICD-10-CM | POA: Insufficient documentation

## 2024-10-25 NOTE — Patient Instructions (Signed)
No ice or IBU for 3 days Heat and Tylenol are ok See me again in 6-8 weeks 

## 2024-10-25 NOTE — Progress Notes (Signed)
 [facility] [address] [place] [address] Phone: [phone] Subjective:   Rachel Juarez, am serving as a scribe for Dr. Arthea Claudene.  I'm seeing this patient by the request  of:  Rachel Dawes, MD  CC: Shoulder pain  YEP:Dlagzrupcz  Rachel Juarez is a 68 y.o. female coming in with complaint of shoulder pain.  Left shoulder MRI did show that there was a moderate tendinosis with a bursal surface tear of the supraspinatus, patient is here to try PRP.       Past Medical History:  Diagnosis Date   Allergy    Anxiety    Depression, major, in remission 2006   when daughter dx'd with autoimmune disorder   Family history of breast cancer    Family history of cancer tonsil    Family history of thyroid  cancer    Generalized anxiety disorder    generalized, and related to mammograms and flying   Glaucoma suspect    sees Dr. Octavia, has all been okay, sees him qoyr   Herpes labialis    Hypercholesterolemia    high HDL, borderline LDL   Hypothyroidism    treated by Dr. Tommas   Multinodular goiter    followed by Dr. Tommas; u/s q2 yr   PPD positive 1988   treated x 6 mos of INH   Seasonal allergies    takes singulair  year-round; worse in the Fall   Past Surgical History:  Procedure Laterality Date   CATARACT EXTRACTION, BILATERAL Bilateral June/July 2021   Dr. Ruth   KNEE ARTHROSCOPY Right 02/21/2022   for torn medial meniscus (Dr. Sheril)   SHOULDER ARTHROSCOPY Right 11/11/2023   Dr. Dozier   Social History   Socioeconomic History   Marital status: Married    Spouse name: Not on file   Number of children: Not on file   Years of education: Not on file   Highest education level: Not on file  Occupational History   Occupation: yoga teacher  Tobacco Use   Smoking status: Never   Smokeless tobacco: Never  Vaping Use   Vaping status: Never Used  Substance and Sexual Activity   Alcohol use: Yes    Comment: 2.5  drinks/week   Drug use: No   Sexual activity: Yes    Partners: Male    Birth control/protection: Post-menopausal  Other Topics Concern   Not on file  Social History Narrative   Pre-COVID, taught yoga at multiple places.    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 1 dog ([place]oo)   Lives with her husband, and she has a daughter, Rachel Juarez--graduated from [employer], married, lives in [place], works as an OR engineer, civil (consulting) at [employer] to work for investment banker, corporate    Volunteers at the [employer]      Updated 02/2024   Social Drivers of Health   Financial Resource Strain: Patient Declined (03/22/2024)   Overall Financial Resource Strain (CARDIA)    Difficulty of Paying Living Expenses: Patient declined  Food Insecurity: Patient Declined (03/22/2024)   Hunger Vital Sign    Worried About Running Out of Food in the Last Year: Patient declined    Ran Out of Food in the Last Year: Patient declined  Transportation Needs: Patient Declined (03/22/2024)   PRAPARE - Transportation    Lack of Transportation (Medical): Patient declined    Lack of Transportation (Non-Medical): Patient declined  Physical Activity: Patient Declined (03/22/2024)   Exercise Vital Sign  Days of Exercise per Week: Patient declined    Minutes of Exercise per Session: Patient declined  Stress: Patient Declined (03/22/2024)   [employer] of Occupational Health - Occupational Stress Questionnaire    Feeling of Stress : Patient declined  Social Connections: Patient Declined (03/22/2024)   Social Connection and Isolation Panel    Frequency of Communication with Friends and Family: Patient declined    Frequency of Social Gatherings with Friends and Family: Patient declined    Attends Religious Services: Patient declined    Database Administrator or Organizations: Patient declined    Attends Banker Meetings: Patient declined    Marital Status: Patient declined   Allergies  Allergen Reactions    Demerol [Meperidine] Other (See Comments)    Low bp, passes out      Objective  Last menstrual period 11/19/2007.   General: No apparent distress alert and oriented x3 mood and affect normal, dressed appropriately.   Procedure: Real-time Ultrasound Guided Injection of left supraspinatus tendon sheath Device: GE Logiq E  Ultrasound guided injection is preferred based studies that show increased duration, increased effect, greater accuracy, decreased procedural pain, increased response rate with ultrasound guided versus blind injection.  Verbal informed consent obtained.  Time-out conducted.  Noted no overlying erythema, induration, or other signs of local infection.  Skin prepped in a sterile fashion.  Local anesthesia: Topical Ethyl chloride.  With sterile technique and under real time ultrasound guidance:  Joint visualized.  21g 2 inch needle inserted lateral approach. Pictures taken for needle placement. Patient did have injection of 2 cc of 0.5% Marcaine, and 1cc of Kenalog  40 mg/dL.  Then 5 cc PRP into the left supraspinatus tendon sheath completed without difficulty  Pain immediately resolved suggesting accurate placement of the medication.  Advised to call if fevers/chills, erythema, induration, drainage, or persistent bleeding.  Images permanently stored and available for review in the ultrasound unit.  Impression: Technically successful ultrasound guided injection.   Impression and Recommendations:     The above documentation has been reviewed and is accurate and complete Rachel Rufus M Zaliyah Meikle, DO

## 2024-10-25 NOTE — Assessment & Plan Note (Addendum)
 Patient given injection and tolerated the procedure well, discussed post PRP care.

## 2024-10-26 ENCOUNTER — Other Ambulatory Visit: Payer: Self-pay | Admitting: Family Medicine

## 2024-10-26 DIAGNOSIS — F411 Generalized anxiety disorder: Secondary | ICD-10-CM

## 2024-10-28 ENCOUNTER — Encounter: Payer: Self-pay | Admitting: Family Medicine

## 2024-10-28 ENCOUNTER — Ambulatory Visit: Admitting: Family Medicine

## 2024-10-28 VITALS — BP 118/82 | HR 99 | Ht 66.0 in

## 2024-10-28 DIAGNOSIS — M170 Bilateral primary osteoarthritis of knee: Secondary | ICD-10-CM

## 2024-10-28 NOTE — Assessment & Plan Note (Signed)
 Bilateral injections given again today.  Tolerated the procedure well, discussed icing regimen and home exercises, discussed with patient to continue with strengthening.  Follow-up again in 6 to 12 weeks

## 2024-10-28 NOTE — Patient Instructions (Addendum)
 Good to see you! Injections in knees today Good to see you! See you again at next appointment

## 2024-10-28 NOTE — Progress Notes (Signed)
 [facility] [address] [place] [address] Phone: [phone] Subjective:   Rachel Juarez, am serving as a scribe for Dr. Arthea Claudene.  I'm seeing this patient by the request  of:  Randol Dawes, MD  CC: knee pain follow up   YEP:Dlagzrupcz  Rachel Juarez is a 68 y.o. female coming in with complaint of knees pain.  R is worse than L. Thinks both knees need love today.  Patient has had known arthritic changes of the knees.       Past Medical History:  Diagnosis Date   Allergy    Anxiety    Depression, major, in remission 2006   when daughter dx'd with autoimmune disorder   Family history of breast cancer    Family history of cancer tonsil    Family history of thyroid  cancer    Generalized anxiety disorder    generalized, and related to mammograms and flying   Glaucoma suspect    sees Dr. Octavia, has all been okay, sees him qoyr   Herpes labialis    Hypercholesterolemia    high HDL, borderline LDL   Hypothyroidism    treated by Dr. Tommas   Multinodular goiter    followed by Dr. Tommas; u/s q2 yr   PPD positive 1988   treated x 6 mos of INH   Seasonal allergies    takes singulair  year-round; worse in the Fall   Past Surgical History:  Procedure Laterality Date   CATARACT EXTRACTION, BILATERAL Bilateral June/July 2021   Dr. Ruth   KNEE ARTHROSCOPY Right 02/21/2022   for torn medial meniscus (Dr. Sheril)   SHOULDER ARTHROSCOPY Right 11/11/2023   Dr. Dozier   Social History   Socioeconomic History   Marital status: Married    Spouse name: Not on file   Number of children: Not on file   Years of education: Not on file   Highest education level: Not on file  Occupational History   Occupation: yoga teacher  Tobacco Use   Smoking status: Never   Smokeless tobacco: Never  Vaping Use   Vaping status: Never Used  Substance and Sexual Activity   Alcohol use: Yes    Comment: 2.5 drinks/week   Drug use: No    Sexual activity: Yes    Partners: Male    Birth control/protection: Post-menopausal  Other Topics Concern   Not on file  Social History Narrative   Pre-COVID, taught yoga at multiple places.    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, corporate    Volunteers at the [employer]      Updated 02/2024   Social Drivers of Health   Financial Resource Strain: Patient Declined (03/22/2024)   Overall Financial Resource Strain (CARDIA)    Difficulty of Paying Living Expenses: Patient declined  Food Insecurity: Patient Declined (03/22/2024)   Hunger Vital Sign    Worried About Running Out of Food in the Last Year: Patient declined    Ran Out of Food in the Last Year: Patient declined  Transportation Needs: Patient Declined (03/22/2024)   PRAPARE - Transportation    Lack of Transportation (Medical): Patient declined    Lack of Transportation (Non-Medical): Patient declined  Physical Activity: Patient Declined (03/22/2024)   Exercise Vital Sign  Days of Exercise per Week: Patient declined    Minutes of Exercise per Session: Patient declined  Stress: Patient Declined (03/22/2024)   [employer] of Occupational Health - Occupational Stress Questionnaire    Feeling of Stress : Patient declined  Social Connections: Patient Declined (03/22/2024)   Social Connection and Isolation Panel    Frequency of Communication with Friends and Family: Patient declined    Frequency of Social Gatherings with Friends and Family: Patient declined    Attends Religious Services: Patient declined    Database Administrator or Organizations: Patient declined    Attends Engineer, Structural: Patient declined    Marital Status: Patient declined   Allergies  Allergen Reactions   Demerol [Meperidine] Other  (See Comments)    Low bp, passes out   Family History  Problem Relation Age of Onset   Lupus Mother    Cancer Father 7       thymic cancer   COPD Father    Heart disease Father        CHF   Asthma Father    Cancer Sister    Breast cancer Sister 83       best kind, lumpectomy and radiation   Colon cancer Maternal Grandmother        80's   Cancer Maternal Grandmother    Hypertension Paternal Grandmother    Hyperlipidemia Paternal Grandmother    Thyroid  disease Paternal Grandmother        had goiter removed   Stroke Paternal Grandmother 82   Hypertension Paternal Grandfather    Hyperlipidemia Paternal Grandfather    Heart disease Paternal Grandfather        MI first in his 68's   Stroke Paternal Grandfather    Dermatomyositis Daughter 30       juvenile form   Irritable bowel syndrome Daughter    Breast cancer Paternal Aunt 18   Cancer Paternal Uncle        started on tonsils (smoker)   Breast cancer Cousin 36   Breast cancer Cousin 79       (the mother of the 58 y/o cousin with br ca   Thyroid  cancer Cousin    Diabetes Neg Hx    Ovarian cancer Neg Hx     Current Outpatient Medications (Endocrine & Metabolic):    levothyroxine  (SYNTHROID ) 75 MCG tablet, Take 1 tablet (75 mcg total) by mouth daily before breakfast. Take 6 days/week as directed by physician   predniSONE  (DELTASONE ) 10 MG tablet, 6 tablets all together day 1, 5 tablets day 2, 4 tablets day 3, 3 tablets day 4, 2 tablets day 5, 1 tablet day 6.  Current Outpatient Medications (Cardiovascular):    rosuvastatin  (CRESTOR ) 10 MG tablet, Take 1 tablet (10 mg total) by mouth daily.  Current Outpatient Medications (Respiratory):    cetirizine (ZYRTEC) 10 MG tablet, Take 10 mg by mouth daily.   fluticasone  (FLONASE ) 50 MCG/ACT nasal spray, Place 2 sprays into both nostrils daily.   montelukast  (SINGULAIR ) 10 MG tablet, TAKE 1 TABLET BY MOUTH EVERY NIGHT AT BEDTIME    Current Outpatient Medications (Other):     ALPRAZolam  (XANAX ) 0.5 MG tablet, Take 0.5-1 tablets (0.25-0.5 mg total) by mouth 3 (three) times daily as needed for anxiety or sleep.   busPIRone  (BUSPAR ) 15 MG tablet, TAKE 1 TABLET BY MOUTH 2 TIMES A DAY   Calcium  250 MG CAPS, Take 1-2 capsules by mouth daily.   cholecalciferol (VITAMIN D3) 25 MCG (1000  UNIT) tablet, Take 1,000 Units by mouth daily.   hydrOXYzine  (ATARAX ) 10 MG tablet, Take 1 tablet (10 mg total) by mouth 3 (three) times daily as needed.   Magnesium 500 MG TABS, Take 1 tablet by mouth daily.   MILK THISTLE PO, Take 1 capsule by mouth daily.   Misc Natural Products (TART CHERRY ADVANCED PO), Take 1 capsule by mouth daily.   mupirocin  ointment (BACTROBAN ) 2 %, Apply 1 application topically 2 (two) times daily.   NON FORMULARY, Take 2 capsules by mouth daily.   NON FORMULARY, Take 2 capsules by mouth daily.   Omega-3 Fatty Acids (FISH OIL) 1000 MG CPDR, Take 2,000 mg by mouth daily.   tamoxifen  (NOLVADEX ) 10 MG tablet, TAKE 1/2 TABLET BY MOUTH DAILY   triamcinolone  cream (KENALOG ) 0.1 %, Apply 1 Application topically 2 (two) times daily.   valACYclovir  (VALTREX ) 1000 MG tablet, TAKE 2 TABLETS AT ONSET OF COLD SORE. REPEAT ONCE IN 12 HOURS (4 PILLS/COURSE)   venlafaxine  XR (EFFEXOR -XR) 150 MG 24 hr capsule, TAKE 1 CAPSULE BY MOUTH DAILY WITH BREAKFAST   vitamin C (ASCORBIC ACID) 500 MG tablet, Take 500 mg by mouth daily.   Reviewed prior external information including notes and imaging from  primary care provider As well as notes that were available from care everywhere and other healthcare systems.  Past medical history, social, surgical and family history all reviewed in electronic medical record.  No pertanent information unless stated regarding to the chief complaint.   Review of Systems:  No headache, visual changes, nausea, vomiting, diarrhea, constipation, dizziness, abdominal pain, skin rash, fevers, chills, night sweats, weight loss, swollen lymph nodes, body  aches, joint swelling, chest pain, shortness of breath, mood changes. POSITIVE muscle aches  Objective  Blood pressure 118/82, pulse 99, height 5' 6 (1.676 m), last menstrual period 11/19/2007, SpO2 96%.   General: No apparent distress alert and oriented x3 mood and affect normal, dressed appropriately.  HEENT: Pupils equal, extraocular movements intact  Respiratory: Patient's speak in full sentences and does not appear short of breath  Cardiovascular: No lower extremity edema, non tender, no erythema  Knee exam arthritic changes noted.  Trace effusion of the knees bilaterally.  After informed written and verbal consent, patient was seated on exam table. Right knee was prepped with alcohol swab and utilizing anterolateral approach, patient's right knee space was injected with 4:1  marcaine 0.5%: Kenalog  40mg /dL. Patient tolerated the procedure well without immediate complications.  After informed written and verbal consent, patient was seated on exam table. Left knee was prepped with alcohol swab and utilizing anterolateral approach, patient's left knee space was injected with 4:1  marcaine 0.5%: Kenalog  40mg /dL. Patient tolerated the procedure well without immediate complications.    Impression and Recommendations:     The above documentation has been reviewed and is accurate and complete Vondra Aldredge M Daltyn Degroat, DO

## 2024-11-30 NOTE — Progress Notes (Unsigned)
 " [facility] [address] [place] [address] Phone: [phone] Subjective:   Rachel Juarez am a scribe for Dr. Claudene.   I'm seeing this patient by the request  of:  Randol Dawes, MD  CC: Median shoulder pain  YEP:Dlagzrupcz  10/28/2024 Bilateral injections given again today.  Tolerated the procedure well, discussed icing regimen and home exercises, discussed with patient to continue with strengthening.  Follow-up again in 6 to 12 weeks     Updated 12/02/2023 Rachel Juarez is a 69 y.o. female coming in with complaint of knee and shoulder pain. Patient states that the left shoulder is hurting today. It is not good. Knees are great.        Past Medical History:  Diagnosis Date   Allergy    Anxiety    Depression, major, in remission 2006   when daughter dx'd with autoimmune disorder   Family history of breast cancer    Family history of cancer tonsil    Family history of thyroid  cancer    Generalized anxiety disorder    generalized, and related to mammograms and flying   Glaucoma suspect    sees Dr. Octavia, has all been okay, sees him qoyr   Herpes labialis    Hypercholesterolemia    high HDL, borderline LDL   Hypothyroidism    treated by Dr. Tommas   Multinodular goiter    followed by Dr. Tommas; u/s q2 yr   PPD positive 1988   treated x 6 mos of INH   Seasonal allergies    takes singulair  year-round; worse in the Fall   Past Surgical History:  Procedure Laterality Date   CATARACT EXTRACTION, BILATERAL Bilateral June/July 2021   Dr. Ruth   KNEE ARTHROSCOPY Right 02/21/2022   for torn medial meniscus (Dr. Sheril)   SHOULDER ARTHROSCOPY Right 11/11/2023   Dr. Dozier   Social History   Socioeconomic History   Marital status: Married    Spouse name: Not on file   Number of children: Not on file   Years of education: Not on file   Highest education level: Not on file  Occupational History   Occupation: yoga  teacher  Tobacco Use   Smoking status: Never   Smokeless tobacco: Never  Vaping Use   Vaping status: Never Used  Substance and Sexual Activity   Alcohol use: Yes    Comment: 2.5 drinks/week   Drug use: No   Sexual activity: Yes    Partners: Male    Birth control/protection: Post-menopausal  Other Topics Concern   Not on file  Social History Narrative   Pre-COVID, taught yoga at multiple places.    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, corporate    Volunteers at the [employer]      Updated 02/2024   Social Drivers of Health   Tobacco Use: Low Risk (10/28/2024)   Patient History    Smoking Tobacco Use: Never    Smokeless Tobacco Use: Never    Passive Exposure: Not on file  Financial Resource Strain: Patient Declined (03/22/2024)   Overall Financial Resource Strain (CARDIA)    Difficulty of Paying Living Expenses: Patient declined  Food Insecurity: Patient Declined (03/22/2024)   Hunger Vital Sign    Worried About  Running Out of Food in the Last Year: Patient declined    Ran Out of Food in the Last Year: Patient declined  Transportation Needs: Patient Declined (03/22/2024)   PRAPARE - Administrator, Civil Service (Medical): Patient declined    Lack of Transportation (Non-Medical): Patient declined  Physical Activity: Patient Declined (03/22/2024)   Exercise Vital Sign    Days of Exercise per Week: Patient declined    Minutes of Exercise per Session: Patient declined  Stress: Patient Declined (03/22/2024)   [employer] of Occupational Health - Occupational Stress Questionnaire    Feeling of Stress : Patient declined  Social Connections: Patient Declined (03/22/2024)   Social Connection and Isolation Panel    Frequency of Communication with Friends and  Family: Patient declined    Frequency of Social Gatherings with Friends and Family: Patient declined    Attends Religious Services: Patient declined    Active Member of Clubs or Organizations: Patient declined    Attends Banker Meetings: Patient declined    Marital Status: Patient declined  Depression (PHQ2-9): Low Risk (03/22/2024)   Depression (PHQ2-9)    PHQ-2 Score: 1  Alcohol Screen: Not on file  Housing: Patient Declined (03/22/2024)   Housing Stability Vital Sign    Unable to Pay for Housing in the Last Year: Patient declined    Number of Times Moved in the Last Year: Not on file    Homeless in the Last Year: Patient declined  Utilities: Patient Declined (03/22/2024)   [employer] Utilities    Threatened with loss of utilities: Patient declined  Health Literacy: Not on file   Allergies[1] Family History  Problem Relation Age of Onset   Lupus Mother    Cancer Father 71       thymic cancer   COPD Father    Heart disease Father        CHF   Asthma Father    Cancer Sister    Breast cancer Sister 39       best kind, lumpectomy and radiation   Colon cancer Maternal Grandmother        80's   Cancer Maternal Grandmother    Hypertension Paternal Grandmother    Hyperlipidemia Paternal Grandmother    Thyroid  disease Paternal Grandmother        had goiter removed   Stroke Paternal Grandmother 88   Hypertension Paternal Grandfather    Hyperlipidemia Paternal Grandfather    Heart disease Paternal Grandfather        MI first in his 45's   Stroke Paternal Grandfather    Dermatomyositis Daughter 86       juvenile form   Irritable bowel syndrome Daughter    Breast cancer Paternal Aunt 40   Cancer Paternal Uncle        started on tonsils (smoker)   Breast cancer Cousin 54   Breast cancer Cousin 23       (the mother of the 43 y/o cousin with br ca   Thyroid  cancer Cousin    Diabetes Neg Hx    Ovarian cancer Neg Hx     Current Outpatient Medications (Endocrine &  Metabolic):    levothyroxine  (SYNTHROID ) 75 MCG tablet, Take 1 tablet (75 mcg total) by mouth daily before breakfast. Take 6 days/week as directed by physician   predniSONE  (DELTASONE ) 10 MG tablet, 6 tablets all together day 1, 5 tablets day 2, 4 tablets day 3, 3 tablets day 4, 2 tablets day 5, 1 tablet  day 6.  Current Outpatient Medications (Cardiovascular):    nitroGLYCERIN  (NITRO-DUR ) 0.2 mg/hr patch, Apply 1/4 of a patch to skin once daily.   rosuvastatin  (CRESTOR ) 10 MG tablet, Take 1 tablet (10 mg total) by mouth daily.  Current Outpatient Medications (Respiratory):    cetirizine (ZYRTEC) 10 MG tablet, Take 10 mg by mouth daily.   fluticasone  (FLONASE ) 50 MCG/ACT nasal spray, Place 2 sprays into both nostrils daily.   montelukast  (SINGULAIR ) 10 MG tablet, TAKE 1 TABLET BY MOUTH EVERY NIGHT AT BEDTIME  Current Outpatient Medications (Other):    ALPRAZolam  (XANAX ) 0.5 MG tablet, Take 0.5-1 tablets (0.25-0.5 mg total) by mouth 3 (three) times daily as needed for anxiety or sleep.   busPIRone  (BUSPAR ) 15 MG tablet, TAKE 1 TABLET BY MOUTH 2 TIMES A DAY   Calcium  250 MG CAPS, Take 1-2 capsules by mouth daily.   cholecalciferol (VITAMIN D3) 25 MCG (1000 UNIT) tablet, Take 1,000 Units by mouth daily.   hydrOXYzine  (ATARAX ) 10 MG tablet, Take 1 tablet (10 mg total) by mouth 3 (three) times daily as needed.   Magnesium 500 MG TABS, Take 1 tablet by mouth daily.   MILK THISTLE PO, Take 1 capsule by mouth daily.   Misc Natural Products (TART CHERRY ADVANCED PO), Take 1 capsule by mouth daily.   mupirocin  ointment (BACTROBAN ) 2 %, Apply 1 application topically 2 (two) times daily.   NON FORMULARY, Take 2 capsules by mouth daily.   NON FORMULARY, Take 2 capsules by mouth daily.   Omega-3 Fatty Acids (FISH OIL) 1000 MG CPDR, Take 2,000 mg by mouth daily.   tamoxifen  (NOLVADEX ) 10 MG tablet, TAKE 1/2 TABLET BY MOUTH DAILY   triamcinolone  cream (KENALOG ) 0.1 %, Apply 1 Application topically 2 (two)  times daily.   valACYclovir  (VALTREX ) 1000 MG tablet, TAKE 2 TABLETS AT ONSET OF COLD SORE. REPEAT ONCE IN 12 HOURS (4 PILLS/COURSE)   venlafaxine  XR (EFFEXOR -XR) 150 MG 24 hr capsule, TAKE 1 CAPSULE BY MOUTH DAILY WITH BREAKFAST   vitamin C (ASCORBIC ACID) 500 MG tablet, Take 500 mg by mouth daily.   Reviewed prior external information including notes and imaging from  primary care provider As well as notes that were available from care everywhere and other healthcare systems.  Past medical history, social, surgical and family history all reviewed in electronic medical record.  No pertanent information unless stated regarding to the chief complaint.   Review of Systems:  No headache, visual changes, nausea, vomiting, diarrhea, constipation, dizziness, abdominal pain, skin rash, fevers, chills, night sweats, weight loss, swollen lymph nodes, body aches, joint swelling, chest pain, shortness of breath, mood changes. POSITIVE muscle aches  Objective  Blood pressure 122/70, pulse (!) 101, height 5' 6 (1.676 m), weight 153 lb (69.4 kg), last menstrual period 11/19/2007, SpO2 94%.   General: No apparent distress alert and oriented x3 mood and affect normal, dressed appropriately.  HEENT: Pupils equal, extraocular movements intact  Respiratory: Patient's speak in full sentences and does not appear short of breath  Cardiovascular: No lower extremity edema, non tender, no erythema  Knee exam shows no significant swelling  Shoulder exam shows tenderness to palpation diffusely.  Positive impingement. Rotator cuff strength seems to be intact  Limited muscular skeletal ultrasound was performed and interpreted by CLAUDENE HUSSAR, M  Limited ultrasound of patient's left shoulder does show some hypoechoic changes but does have some hyperechoic change that is consistent with scar tissue formation.  New area of hypoechoic changes that is consistent  with a potential acute tear of the rotator cuff with 0.6  cm of retraction.   Impression and Recommendations:    The above documentation has been reviewed and is accurate and complete Arthea CHRISTELLA Sharps, DO        [1]  Allergies Allergen Reactions   Demerol [Meperidine] Other (See Comments)    Low bp, passes out   "

## 2024-12-01 ENCOUNTER — Other Ambulatory Visit: Payer: Self-pay

## 2024-12-01 ENCOUNTER — Encounter: Payer: Self-pay | Admitting: Family Medicine

## 2024-12-01 ENCOUNTER — Ambulatory Visit: Admitting: Family Medicine

## 2024-12-01 VITALS — BP 122/70 | HR 101 | Ht 66.0 in | Wt 153.0 lb

## 2024-12-01 DIAGNOSIS — M25512 Pain in left shoulder: Secondary | ICD-10-CM | POA: Diagnosis not present

## 2024-12-01 DIAGNOSIS — G8929 Other chronic pain: Secondary | ICD-10-CM | POA: Diagnosis not present

## 2024-12-01 DIAGNOSIS — M75112 Incomplete rotator cuff tear or rupture of left shoulder, not specified as traumatic: Secondary | ICD-10-CM

## 2024-12-01 MED ORDER — NITROGLYCERIN 0.2 MG/HR TD PT24: MEDICATED_PATCH | TRANSDERMAL | 0 refills | Status: AC

## 2024-12-01 NOTE — Patient Instructions (Addendum)
 Good to see you. Nitroglycerin  Protocol   Apply 1/4 nitroglycerin  patch to affected area daily.  Change position of patch within the affected area every 24 hours.  You may experience a headache during the first 1-2 weeks of using the patch, these should subside.  If you experience headaches after beginning nitroglycerin  patch treatment, you may take your preferred over the counter pain reliever.  Another side effect of the nitroglycerin  patch is skin irritation or rash related to patch adhesive.  Please notify our office if you develop more severe headaches or rash, and stop the patch.  Tendon healing with nitroglycerin  patch may require 12 to 24 weeks depending on the extent of injury.  Men should not use if taking Viagra, Cialis, or Levitra.   Do not use if you have migraines or rosacea.  See me again in 6-8 weeks

## 2024-12-01 NOTE — Assessment & Plan Note (Signed)
 Patient interstitial tearing that does seem to have more scar tissue formation but unfortunately a potential acute tear is noted of the supraspinatus.  Discussed with patient about the possibility of nitroglycerin  patches.  Warned of potential side effects.  Patient will monitor.  Will do not think we need to repeat the PRP at this time.  Hopeful that patient will start making more progress.  Hold on any type of anti-inflammatory at the moment.  Follow-up again in 6 to 12 weeks

## 2024-12-02 ENCOUNTER — Encounter: Payer: Self-pay | Admitting: Family Medicine

## 2024-12-08 ENCOUNTER — Other Ambulatory Visit: Payer: Self-pay

## 2024-12-08 DIAGNOSIS — G8929 Other chronic pain: Secondary | ICD-10-CM

## 2024-12-27 ENCOUNTER — Inpatient Hospital Stay: Payer: Medicare Other | Admitting: Hematology and Oncology

## 2025-01-13 ENCOUNTER — Ambulatory Visit: Admitting: Family Medicine

## 2025-01-25 ENCOUNTER — Inpatient Hospital Stay: Admitting: Hematology and Oncology

## 2025-03-23 ENCOUNTER — Ambulatory Visit: Payer: Self-pay | Admitting: Family Medicine
